# Patient Record
Sex: Male | Born: 1955 | Race: White | Hispanic: No | Marital: Married | Smoking: Never smoker
Health system: Southern US, Community
[De-identification: ages and names within clinical notes are randomized; demographics above are authoritative.]

## PROBLEM LIST (undated history)

## (undated) DIAGNOSIS — E785 Hyperlipidemia, unspecified: Secondary | ICD-10-CM

## (undated) DIAGNOSIS — R7303 Prediabetes: Secondary | ICD-10-CM

## (undated) DIAGNOSIS — R972 Elevated prostate specific antigen [PSA]: Secondary | ICD-10-CM

## (undated) DIAGNOSIS — R0789 Other chest pain: Secondary | ICD-10-CM

## (undated) DIAGNOSIS — I82409 Acute embolism and thrombosis of unspecified deep veins of unspecified lower extremity: Secondary | ICD-10-CM

## (undated) DIAGNOSIS — I2699 Other pulmonary embolism without acute cor pulmonale: Secondary | ICD-10-CM

## (undated) DIAGNOSIS — F329 Major depressive disorder, single episode, unspecified: Secondary | ICD-10-CM

## (undated) DIAGNOSIS — F32A Depression, unspecified: Secondary | ICD-10-CM

## (undated) DIAGNOSIS — C4492 Squamous cell carcinoma of skin, unspecified: Secondary | ICD-10-CM

## (undated) DIAGNOSIS — I10 Essential (primary) hypertension: Secondary | ICD-10-CM

## (undated) DIAGNOSIS — D689 Coagulation defect, unspecified: Secondary | ICD-10-CM

## (undated) DIAGNOSIS — F419 Anxiety disorder, unspecified: Secondary | ICD-10-CM

## (undated) DIAGNOSIS — N529 Male erectile dysfunction, unspecified: Secondary | ICD-10-CM

## (undated) DIAGNOSIS — G4730 Sleep apnea, unspecified: Secondary | ICD-10-CM

## (undated) HISTORY — DX: Depression, unspecified: F32.A

## (undated) HISTORY — DX: Other chest pain: R07.89

## (undated) HISTORY — DX: Essential (primary) hypertension: I10

## (undated) HISTORY — DX: Major depressive disorder, single episode, unspecified: F32.9

## (undated) HISTORY — DX: Hyperlipidemia, unspecified: E78.5

## (undated) HISTORY — DX: Male erectile dysfunction, unspecified: N52.9

## (undated) HISTORY — PX: COLONOSCOPY: SHX174

## (undated) HISTORY — PX: POLYPECTOMY: SHX149

## (undated) HISTORY — DX: Anxiety disorder, unspecified: F41.9

## (undated) HISTORY — DX: Prediabetes: R73.03

## (undated) HISTORY — DX: Sleep apnea, unspecified: G47.30

## (undated) HISTORY — DX: Coagulation defect, unspecified: D68.9

## (undated) HISTORY — DX: Elevated prostate specific antigen (PSA): R97.20

## (undated) HISTORY — DX: Squamous cell carcinoma of skin, unspecified: C44.92

## (undated) HISTORY — PX: VASECTOMY: SHX75

## (undated) HISTORY — DX: Other pulmonary embolism without acute cor pulmonale: I26.99

---

## 2002-03-25 ENCOUNTER — Encounter: Admission: RE | Admit: 2002-03-25 | Discharge: 2002-03-25 | Payer: Self-pay | Admitting: Family Medicine

## 2002-03-25 ENCOUNTER — Encounter: Payer: Self-pay | Admitting: Family Medicine

## 2004-09-18 ENCOUNTER — Ambulatory Visit: Payer: Self-pay | Admitting: Family Medicine

## 2005-06-09 ENCOUNTER — Emergency Department (HOSPITAL_COMMUNITY): Admission: EM | Admit: 2005-06-09 | Discharge: 2005-06-09 | Payer: Self-pay | Admitting: Family Medicine

## 2005-10-22 ENCOUNTER — Ambulatory Visit: Payer: Self-pay | Admitting: Family Medicine

## 2005-10-30 ENCOUNTER — Ambulatory Visit: Payer: Self-pay

## 2005-11-28 ENCOUNTER — Ambulatory Visit: Payer: Self-pay | Admitting: Family Medicine

## 2005-12-08 ENCOUNTER — Ambulatory Visit: Payer: Self-pay | Admitting: Family Medicine

## 2006-01-05 ENCOUNTER — Ambulatory Visit: Payer: Self-pay | Admitting: Family Medicine

## 2006-01-26 ENCOUNTER — Ambulatory Visit: Payer: Self-pay | Admitting: Family Medicine

## 2006-02-16 ENCOUNTER — Ambulatory Visit: Payer: Self-pay | Admitting: Family Medicine

## 2006-03-13 ENCOUNTER — Ambulatory Visit: Payer: Self-pay | Admitting: Family Medicine

## 2006-05-06 ENCOUNTER — Ambulatory Visit: Payer: Self-pay | Admitting: Family Medicine

## 2006-05-06 LAB — CONVERTED CEMR LAB
ALT: 42 units/L — ABNORMAL HIGH (ref 0–40)
AST: 34 units/L (ref 0–37)
Albumin: 3.7 g/dL (ref 3.5–5.2)
Alkaline Phosphatase: 69 units/L (ref 39–117)
Bilirubin, Direct: 0.2 mg/dL (ref 0.0–0.3)
Total Bilirubin: 1.1 mg/dL (ref 0.3–1.2)
Total Protein: 6.7 g/dL (ref 6.0–8.3)

## 2006-05-18 ENCOUNTER — Ambulatory Visit: Payer: Self-pay | Admitting: Internal Medicine

## 2006-08-10 DIAGNOSIS — E785 Hyperlipidemia, unspecified: Secondary | ICD-10-CM

## 2006-08-10 DIAGNOSIS — N400 Enlarged prostate without lower urinary tract symptoms: Secondary | ICD-10-CM

## 2006-08-10 DIAGNOSIS — C801 Malignant (primary) neoplasm, unspecified: Secondary | ICD-10-CM | POA: Insufficient documentation

## 2006-08-10 HISTORY — DX: Benign prostatic hyperplasia without lower urinary tract symptoms: N40.0

## 2006-08-10 HISTORY — DX: Hyperlipidemia, unspecified: E78.5

## 2007-05-13 HISTORY — PX: KNEE SURGERY: SHX244

## 2008-07-10 DIAGNOSIS — N529 Male erectile dysfunction, unspecified: Secondary | ICD-10-CM | POA: Insufficient documentation

## 2008-07-10 HISTORY — DX: Male erectile dysfunction, unspecified: N52.9

## 2008-07-28 ENCOUNTER — Ambulatory Visit: Payer: Self-pay | Admitting: Internal Medicine

## 2008-07-28 ENCOUNTER — Encounter (INDEPENDENT_AMBULATORY_CARE_PROVIDER_SITE_OTHER): Payer: Self-pay | Admitting: *Deleted

## 2008-07-28 DIAGNOSIS — E669 Obesity, unspecified: Secondary | ICD-10-CM

## 2008-07-28 DIAGNOSIS — R030 Elevated blood-pressure reading, without diagnosis of hypertension: Secondary | ICD-10-CM | POA: Insufficient documentation

## 2008-07-28 HISTORY — DX: Elevated blood-pressure reading, without diagnosis of hypertension: R03.0

## 2008-07-28 HISTORY — DX: Obesity, unspecified: E66.9

## 2008-08-03 ENCOUNTER — Ambulatory Visit: Payer: Self-pay | Admitting: Internal Medicine

## 2008-08-03 LAB — CONVERTED CEMR LAB
Bilirubin Urine: NEGATIVE
Glucose, Urine, Semiquant: NEGATIVE
Ketones, urine, test strip: NEGATIVE
Nitrite: NEGATIVE
Protein, U semiquant: NEGATIVE
RBC / HPF: NONE SEEN (ref <3)
Specific Gravity, Urine: <1.005
Urobilinogen, UA: 0.2
WBC Urine, dipstick: NEGATIVE
WBC, UA: NONE SEEN cells/hpf (ref <3)
pH: 5

## 2008-08-06 LAB — CONVERTED CEMR LAB
ALT: 39 units/L (ref 0–53)
AST: 30 units/L (ref 0–37)
BUN: 14 mg/dL (ref 6–23)
Basophils Absolute: 0 10*3/uL (ref 0.0–0.1)
Basophils Relative: 0.6 % (ref 0.0–3.0)
CO2: 28 meq/L (ref 19–32)
Calcium: 9.3 mg/dL (ref 8.4–10.5)
Chloride: 108 meq/L (ref 96–112)
Cholesterol: 237 mg/dL — ABNORMAL HIGH (ref 0–200)
Creatinine, Ser: 1 mg/dL (ref 0.4–1.5)
Direct LDL: 163.2 mg/dL
Eosinophils Absolute: 0.3 10*3/uL (ref 0.0–0.7)
Eosinophils Relative: 4.4 % (ref 0.0–5.0)
GFR calc non Af Amer: 83.8 mL/min (ref >60)
Glucose, Bld: 100 mg/dL — ABNORMAL HIGH (ref 70–99)
HCT: 47 % (ref 39.0–52.0)
HDL: 35.8 mg/dL — ABNORMAL LOW (ref >39.00)
Hemoglobin: 16.5 g/dL (ref 13.0–17.0)
Lymphocytes Relative: 23.6 % (ref 12.0–46.0)
Lymphs Abs: 1.7 10*3/uL (ref 0.7–4.0)
MCHC: 35.1 g/dL (ref 30.0–36.0)
MCV: 88.4 fL (ref 78.0–100.0)
Monocytes Absolute: 0.6 10*3/uL (ref 0.1–1.0)
Monocytes Relative: 8.6 % (ref 3.0–12.0)
Neutro Abs: 4.8 10*3/uL (ref 1.4–7.7)
Neutrophils Relative %: 62.8 % (ref 43.0–77.0)
PSA: 1.67 ng/mL (ref 0.10–4.00)
Platelets: 187 10*3/uL (ref 150.0–400.0)
Potassium: 4.1 meq/L (ref 3.5–5.1)
RBC: 5.31 M/uL (ref 4.22–5.81)
RDW: 12.6 % (ref 11.5–14.6)
Sodium: 142 meq/L (ref 135–145)
TSH: 0.79 microintl units/mL (ref 0.35–5.50)
Total CHOL/HDL Ratio: 7
Triglycerides: 147 mg/dL (ref 0.0–149.0)
VLDL: 29.4 mg/dL (ref 0.0–40.0)
WBC: 7.4 10*3/uL (ref 4.5–10.5)

## 2008-08-07 ENCOUNTER — Telehealth (INDEPENDENT_AMBULATORY_CARE_PROVIDER_SITE_OTHER): Payer: Self-pay | Admitting: *Deleted

## 2008-08-07 ENCOUNTER — Encounter (INDEPENDENT_AMBULATORY_CARE_PROVIDER_SITE_OTHER): Payer: Self-pay | Admitting: *Deleted

## 2008-08-10 DIAGNOSIS — D126 Benign neoplasm of colon, unspecified: Secondary | ICD-10-CM

## 2008-08-10 HISTORY — DX: Benign neoplasm of colon, unspecified: D12.6

## 2008-08-15 ENCOUNTER — Ambulatory Visit: Payer: Self-pay | Admitting: Gastroenterology

## 2008-08-30 ENCOUNTER — Ambulatory Visit: Payer: Self-pay | Admitting: Gastroenterology

## 2008-08-30 ENCOUNTER — Encounter: Payer: Self-pay | Admitting: Gastroenterology

## 2008-08-30 LAB — HM COLONOSCOPY

## 2008-08-31 ENCOUNTER — Encounter: Payer: Self-pay | Admitting: Gastroenterology

## 2008-10-13 ENCOUNTER — Ambulatory Visit: Payer: Self-pay | Admitting: Internal Medicine

## 2008-10-13 DIAGNOSIS — L259 Unspecified contact dermatitis, unspecified cause: Secondary | ICD-10-CM

## 2008-10-17 ENCOUNTER — Telehealth: Payer: Self-pay | Admitting: Internal Medicine

## 2009-10-01 ENCOUNTER — Ambulatory Visit: Payer: Self-pay | Admitting: Internal Medicine

## 2009-10-16 ENCOUNTER — Ambulatory Visit: Payer: Self-pay | Admitting: Internal Medicine

## 2009-10-16 DIAGNOSIS — F32A Depression, unspecified: Secondary | ICD-10-CM

## 2009-10-16 DIAGNOSIS — F329 Major depressive disorder, single episode, unspecified: Secondary | ICD-10-CM | POA: Insufficient documentation

## 2009-10-16 DIAGNOSIS — F419 Anxiety disorder, unspecified: Secondary | ICD-10-CM | POA: Insufficient documentation

## 2009-10-16 HISTORY — DX: Anxiety disorder, unspecified: F41.9

## 2009-10-16 HISTORY — DX: Depression, unspecified: F32.A

## 2009-10-26 ENCOUNTER — Ambulatory Visit: Payer: Self-pay | Admitting: Cardiology

## 2009-10-26 ENCOUNTER — Encounter: Payer: Self-pay | Admitting: Physician Assistant

## 2009-10-30 ENCOUNTER — Ambulatory Visit: Payer: Self-pay | Admitting: Internal Medicine

## 2009-11-06 LAB — CONVERTED CEMR LAB
ALT: 39 units/L (ref 0–53)
AST: 30 units/L (ref 0–37)
BUN: 14 mg/dL (ref 6–23)
Basophils Absolute: 0 10*3/uL (ref 0.0–0.1)
Basophils Relative: 0.5 % (ref 0.0–3.0)
CO2: 29 meq/L (ref 19–32)
Calcium: 9.4 mg/dL (ref 8.4–10.5)
Chloride: 108 meq/L (ref 96–112)
Cholesterol: 224 mg/dL — ABNORMAL HIGH (ref 0–200)
Creatinine, Ser: 1.2 mg/dL (ref 0.4–1.5)
Direct LDL: 176 mg/dL
Eosinophils Absolute: 0.2 10*3/uL (ref 0.0–0.7)
Eosinophils Relative: 3 % (ref 0.0–5.0)
GFR calc non Af Amer: 69.57 mL/min (ref >60)
Glucose, Bld: 100 mg/dL — ABNORMAL HIGH (ref 70–99)
HCT: 47.8 % (ref 39.0–52.0)
HDL: 46.4 mg/dL (ref >39.00)
Hemoglobin: 16.3 g/dL (ref 13.0–17.0)
Lymphocytes Relative: 25.2 % (ref 12.0–46.0)
Lymphs Abs: 1.9 10*3/uL (ref 0.7–4.0)
MCHC: 34.1 g/dL (ref 30.0–36.0)
MCV: 91.5 fL (ref 78.0–100.0)
Monocytes Absolute: 0.7 10*3/uL (ref 0.1–1.0)
Monocytes Relative: 9.5 % (ref 3.0–12.0)
Neutro Abs: 4.6 10*3/uL (ref 1.4–7.7)
Neutrophils Relative %: 61.8 % (ref 43.0–77.0)
PSA: 1.7 ng/mL (ref 0.10–4.00)
Platelets: 209 10*3/uL (ref 150.0–400.0)
Potassium: 4.9 meq/L (ref 3.5–5.1)
RBC: 5.23 M/uL (ref 4.22–5.81)
RDW: 13.3 % (ref 11.5–14.6)
Sodium: 144 meq/L (ref 135–145)
TSH: 1.1 microintl units/mL (ref 0.35–5.50)
Total CHOL/HDL Ratio: 5
Triglycerides: 116 mg/dL (ref 0.0–149.0)
VLDL: 23.2 mg/dL (ref 0.0–40.0)
WBC: 7.5 10*3/uL (ref 4.5–10.5)

## 2009-11-30 ENCOUNTER — Telehealth: Payer: Self-pay | Admitting: Family Medicine

## 2010-01-01 ENCOUNTER — Encounter: Payer: Self-pay | Admitting: Physician Assistant

## 2010-01-01 ENCOUNTER — Ambulatory Visit: Payer: Self-pay | Admitting: Cardiovascular Disease

## 2010-01-01 DIAGNOSIS — R079 Chest pain, unspecified: Secondary | ICD-10-CM

## 2010-01-03 ENCOUNTER — Telehealth (INDEPENDENT_AMBULATORY_CARE_PROVIDER_SITE_OTHER): Payer: Self-pay | Admitting: *Deleted

## 2010-01-07 ENCOUNTER — Encounter: Payer: Self-pay | Admitting: Cardiology

## 2010-01-07 ENCOUNTER — Ambulatory Visit: Payer: Self-pay

## 2010-01-07 ENCOUNTER — Encounter (HOSPITAL_COMMUNITY): Admission: RE | Admit: 2010-01-07 | Discharge: 2010-01-31 | Payer: Self-pay | Admitting: Cardiology

## 2010-01-07 ENCOUNTER — Ambulatory Visit: Payer: Self-pay | Admitting: Cardiology

## 2010-01-30 ENCOUNTER — Ambulatory Visit: Payer: Self-pay | Admitting: Cardiology

## 2010-02-01 ENCOUNTER — Telehealth (INDEPENDENT_AMBULATORY_CARE_PROVIDER_SITE_OTHER): Payer: Self-pay | Admitting: *Deleted

## 2010-02-28 ENCOUNTER — Telehealth: Payer: Self-pay | Admitting: Internal Medicine

## 2010-04-18 ENCOUNTER — Observation Stay (HOSPITAL_COMMUNITY): Admission: EM | Admit: 2010-04-18 | Discharge: 2009-10-26 | Payer: Self-pay | Admitting: Emergency Medicine

## 2010-06-11 NOTE — Assessment & Plan Note (Signed)
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 Vasectomy remotely  knee surgery 09-28-2007 --L--  Family History: Reviewed history from 10/01/2009 and no changes required. 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 CV:  Denies chest pain or discomfort and swelling of feet. Resp:  Denies cough, shortness of breath, and wheezing. GI:  Denies bloody stools, diarrhea, nausea, and vomiting. GU:  Denies hematuria, urinary frequency, and urinary hesitancy. Psych:  + stress , states he worries about everything, that has been going on for a while. Some difficulty sleeping at night. Takes Tylenol PM which helps.  Physical  Exam  General:  alert, well-developed, and overweight-appearing.   Neck:  no masses and no thyromegaly.   Lungs:  normal respiratory effort, no intercostal retractions, no accessory muscle use, and normal breath sounds.   Heart:  normal rate, regular rhythm, and no murmur.   Abdomen:  soft, non-tender, no distention, no masses, no guarding, and no rigidity.   Rectal:  No external abnormalities noted. Normal sphincter tone. No rectal masses or tenderness. Prostate:  Prostate gland firm and smooth, no enlargement, nodularity, tenderness, mass, asymmetry or induration. Extremities:  no edema Psych:  Oriented X3, memory intact for recent and remote, normally interactive, good eye contact, not anxious appearing, and not depressed appearing.     Impression & Recommendations:  Problem # 1:  HEALTH SCREENING (ICD-V70.0) Td 2001-09-27 colonoscopy  08/2008, tubular adenoma, next in 5 years check a PSA, he used to see Dr. Etta Grandchild patient has a FH of CAD ----> continue with aspirin, his cholesterol was slightly elevated last year, he has made some changes in his diet. Has not been able to lose weight. h/o SCC skin,again rec. to see Dr Londell Moh yearly  discussed diet and exercise     Problem # 2:  ANXIETY (ICD-300.00) he has some anxiety, states is the type of person that "worries about everything" He has some difficulty sleeping For now I recommend to take clonazepam at bedtime. Encouraged him to let me know if he  thinks he needs something different, SSRIs?  His updated medication list for this problem includes:    Clonazepam 0.5 Mg Tabs (Clonazepam) ..... One or 2 by mouth as needed for insomnia p.r.n.  Problem # 3:  ELEVATED BLOOD PRESSURE WITHOUT DIAGNOSIS OF HYPERTENSION (ICD-796.2) see  instructions  Complete Medication List: 1)  Bayer Low Strength 81 Mg Tbec (Aspirin) .Marland Kitchen.. 1  a  day 2)  Clonazepam 0.5 Mg Tabs (Clonazepam) .... One or 2 by mouth as needed for insomnia p.r.n.  Patient  Instructions: 1)  Check your blood pressure 2 or 3 times a week. If it is more than 140/85 consistently,please let us know  2)  Come back fasting: 3)  FLP, CBC, BMP, AST, ALT, TSH, PSA----Dx V70 4)  Please schedule a follow-up appointment in 6 months .  Prescriptions: CLONAZEPAM 0.5 MG TABS (CLONAZEPAM) one or 2 by mouth as needed for insomnia p.r.n.  #45 x 0   Entered and Authorized by:   Nolon Rod. Paz MD   Signed by:   Nolon Rod. Paz MD on 10/16/2009   Method used:   Print then Give to Patient   RxID:   [id number]

## 2010-06-11 NOTE — Assessment & Plan Note (Signed)
Summary: Cardiology Nuclear Testing  Nuclear Med Background Indications for Stress Test: Evaluation for Ischemia  Indications Comments: 6/11 Ed visit- CP/Epigastric Pain- Negative Enzymes  History: Echo, Myocardial Perfusion Study  History Comments: MPS- Normal  Symptoms: Chest Pain, SOB    Nuclear Pre-Procedure Cardiac Risk Factors: Family History - CAD, Hypertension, Lipids Caffeine/Decaff Intake: None NPO After: 7:30 PM Lungs: clear IV 0.9% NS with Angio Cath: 22g     IV Site: (R)  Wrist IV Started by: Irean Hong, RN Chest Size (in) 46     Height (in): 69 Weight (lb): 236 BMI: 34.98  Nuclear Med Study 1 or 2 day study:  1 day     Stress Test Type:  Stress Reading MD:  Marca Ancona, MD     Referring MD:  [place] Resting Radionuclide:  Technetium 74m Tetrofosmin     Resting Radionuclide Dose:  11 mCi  Stress Radionuclide:  Technetium 77m Tetrofosmin     Stress Radionuclide Dose:  33 mCi   Stress Protocol Exercise Time (min):  9:00 min     Max HR:  151 bpm     Predicted Max HR:  168 bpm  Max Systolic BP: 177 mm Hg     Percent Max HR:  89.88 %     METS: 10.10 Rate Pressure Product:  46962    Stress Test Technologist:  Cathlyn Parsons, RN     Nuclear Technologist:  Domenic Polite, CNMT  Rest Procedure  Myocardial perfusion imaging was performed at rest 45 minutes following the intravenous administration of Technetium 11m Tetrofosmin.  Stress Procedure  The patient exercised for .  The patient stopped due to fatigue,SOB  and denied any chest pain.  There were no significant ST-T wave changes. There was no ectopy noted. Technetium 69m Tetrofosmin was injected at peak exercise and myocardial perfusion imaging was performed after a brief delay.  QPS Raw Data Images:  Patient motion noted; appropriate software correction applied. Stress Images:  Normal homogeneous uptake in all areas of the myocardium. Rest Images:  Normal homogeneous uptake in all areas of  the myocardium. Subtraction (SDS):  No evidence of ischemia. Transient Ischemic Dilatation:  0.91  (Normal <1.22)  Lung/Heart Ratio:  0.38  (Normal <0.45)  Quantitative Gated Spect Images QGS EDV:  92 ml QGS ESV:  36 ml QGS EF:  61 % QGS cine images:  Normal  Findings Normal nuclear study      Overall Impression  Exercise Capacity: Fair exercise capacity. BP Response: Normal blood pressure response. Clinical Symptoms: SOB ECG Impression: No significant ST segment change suggestive of ischemia. Overall Impression: Normal stress nuclear study.  Appended Document: Cardiology Nuclear Testing normal study  Appended Document: Cardiology Nuclear Testing discussed results with pt by telephone

## 2010-06-11 NOTE — Assessment & Plan Note (Signed)
Summary: [name]    Visit Type:  Initial Consult  CC:  chest pain.  History of Present Illness: is a 55 year old white male patient who was seen in the emergency room with sudden onset chest pain. About 10:30 at night on October 25, 2009 after eating a [employer] she developed a burning in his epigastric region and became short of breath. EKG showed normal sinus rhythm with early precordial transition otherwise normal. Cardiac enzymes were normal.  The patient has been under a tremendous amount of stress recently. His mother died suddenly of an MI on Mother's Day at age 104. He also has a brother that had an MI at 92 and father that had a stent. His wife has been newly diagnosed with epilepsy and his daughter is struggling. He feels like stresses contributing a lot to his symptoms. He has gained over 30 pounds in the past several years and does not eat well.  The patient denies exertional symptoms. He does not exercise regularly. While sitting in the office talking about the stress in his life today he did develop the same tightening in his epigastric region. We did an EKG during this episode and it was normal. He states the pain only happens when he is under a lot of stress. He denies radiation of the pain palpitations diaphoresis dizziness or presyncope.  Current Medications (verified): 1)  Bayer Low Strength 81 Mg Tbec (Aspirin) .Marland Kitchen.. 1  A  Day 2)  Clonazepam 0.5 Mg Tabs (Clonazepam) .... One or 2 By Mouth As Needed For Insomnia P.r.n. 3)  Simvastatin 40 Mg Tabs (Simvastatin) .Marland Kitchen.. 1 By Mouth At Bedtime.  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 colon ca--no prostate ca--no CAD- F stent 72, GF GM , B MI at age 54, 33 (MI age 37) HTN--F - B lung Ca--F glaucoma-- M osteoporosis--M MGM - MI (80's) CVA--  GM stomach Ca-- PGF gout - B sister - migraine M - deceased 10-07-2009, MI age 58  Social History: Reviewed history from 11/28/2009 and no changes required. 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 Neck: No JVD, HJR, Bruit, or thyroid enlargement Lungs: No tachypnea, clear without wheezing, rales, or rhonchi Cardiovascular: RRR, PMI not displaced, heart sounds normal, no murmurs, gallops, bruit, thrill, or heave. Abdomen: BS normal. Soft without organomegaly, masses, lesions or tenderness. Extremities: without cyanosis, clubbing or edema. Good distal pulses bilateral SKin: Warm, no lesions or rashes  Musculoskeletal: No deformities Neuro: no focal signs    EKG  Procedure date:  01/01/2010  Findings:      normal sinus rhythm normal EKG  Impression & Recommendations:  Problem # 1:  CHEST PAIN (ICD-786.50) Patient has had several episodes of chest pain described as epigastric burning associated with shortness of breath. This is usually associated with stress and anxiety. The patient does have significant cardiac risk factors, including strong family history, hypercholesterolemia, and borderline hypertension. We will order a stress Myoview to rule out ischemia. His updated  medication list for this problem includes:    Bayer Low Strength 81 Mg Tbec (Aspirin) .Marland Kitchen... 1  a  day  Orders: EKG w/ Interpretation (93000) Nuclear Stress Test (Nuc Stress Test)  Problem # 2:  ANXIETY (ICD-300.00) Anxiety may be contributing to his chest pain. I asked him to discuss this with Dr. Drue Novel.  Problem # 3:  HYPERLIPIDEMIA (ICD-272.4) Patient has been on simvastatin for 2 months. Followed by Dr. Drue Novel His updated medication list for this problem includes:    Simvastatin 40 Mg Tabs (Simvastatin) .Marland Kitchen... 1 by mouth at bedtime.  Problem # 4:  ELEVATED BLOOD PRESSURE WITHOUT DIAGNOSIS OF HYPERTENSION (ICD-796.2) Blood pressure is stable today.  Problem # 5:  OBESITY (ICD-278.00) Patient has gained over 30 pounds over the past couple years. He does eat although a lot of fast food. I asked him to change his diet and begin an exercise program if his stress Myoview is normal.  Patient Instructions: 1)  Your physician recommends that you schedule a follow-up appointment in:  Dr. Shirlee Latch in 1 month 2)  Your physician recommends that you continue on your current medications as directed. Please refer to the Current Medication list given to you today. 3)  Your physician has requested that you have an exercise stress myoview.  For further information please visit [email or web address]  Please follow instruction sheet, as given.

## 2010-06-11 NOTE — Progress Notes (Signed)
Summary: CLONAZEPAM REFILL  Phone Note Refill Request Message from:  Fax from Pharmacy on February 28, 2010 3:41 PM  Refills Requested: Medication #1:  KLONOPIN 0.5 MG TABS 1 or 2 by mouth as needed for insomnia..   Last Refilled: 02/04/2010 [facility] [place], FAX [phone]     Initial call taken by: Jerolyn Shin,  February 28, 2010 3:42 PM  Follow-up for Phone Call        ok 45 and 3 RF Follow-up by: [facility] E. Paz MD,  February 28, 2010 4:20 PM    Prescriptions: KLONOPIN 0.5 MG TABS (CLONAZEPAM) 1 or 2 by mouth as needed for insomnia.  #45 x 3   Entered by:   Army Fossa CMA   Authorized by:   Nolon Rod. Paz MD   Signed by:   Army Fossa CMA on 02/28/2010   Method used:   Printed then faxed [facility] ...       [facility]  [place] [address] (retail)       [address]       [place], [place]  [address]       Ph: [phone] or [phone]       Fax: [phone]   RxID:   [id number]

## 2010-06-11 NOTE — Assessment & Plan Note (Signed)
Summary: poison ivy/cbs   Vital Signs:  Patient profile:   55 year old male Height:      69 inches Weight:      238 pounds BMI:     35.27 Pulse rate:   60 / minute BP sitting:   140 / 100  Vitals Entered By: Shary Decamp (Oct 01, 2009 10:59 AM) CC: poison ivy all over   History of Present Illness: worked in the yard 2 days ago yesterday developed a rash and itching  Current Medications (verified): 1)  Bayer Low Strength 81 Mg Tbec (Aspirin) .Marland Kitchen.. 1  A  Day 2)  Simvastatin 40 Mg Tabs (Simvastatin) .... Per Pt Not Taking 10/01/09  Allergies (verified): No Known Drug Allergies  Past History:  Past Medical History: Reviewed history from 07/28/2008 and no changes required. 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   Skin:  several erythematous lesion of different sizes and shape (legs-arms that were uncovered by cloths) some in the face    Impression & Recommendations:  Problem # 1:  CONTACT DERMATITIS-NOS (ICD-692.9) plan: depomedrol wash all cloths see instructions  The following medications were removed from the medication list:  Prednisone 20 Mg Tabs (Prednisone) .Marland Kitchen... 1 by mouth once daily    Hydrocortisone 2.5 % Crea (Hydrocortisone) .Marland Kitchen... Apply two times a day x 7 days    Prednisone 10 Mg Tabs (Prednisone) .Marland KitchenMarland KitchenMarland KitchenMarland Kitchen 6 tabs by mouth x 1 day, 5 x 1 , 4x1, 3x1, 2x1, 1x2 His updated medication list for this problem includes:    Hydrocortisone 2.5 % Crea (Hydrocortisone) .Marland Kitchen... Apply two times a day x 1 week  Orders: Depo- Medrol 80mg  (J1040) Admin of Therapeutic Inj  intramuscular or subcutaneous (81191)  Problem # 2:  due for CPX, pt aware  Complete Medication List: 1)  Bayer Low Strength 81 Mg Tbec (Aspirin) .Marland Kitchen.. 1  a  day 2)  Hydrocortisone 2.5 % Crea (Hydrocortisone) .... Apply two times a day x 1 week  Patient Instructions: 1)  cream as prescribed 2)  claritin 10mg  OTC daily  Prescriptions: HYDROCORTISONE 2.5 % CREA (HYDROCORTISONE) apply two times a day x 1 week  #1 x 0   Entered and Authorized by:   Nolon Rod. Paz MD   Signed by:   Nolon Rod. Paz MD on 10/01/2009   Method used:   [facility]        [facility]  [place] [address] [facility]       [address]       [place], [place]  [address]  Ph: [phone] or [phone]       Fax: [phone]   RxID:   [id number]    Medication Administration  Injection # 1:    Medication: Depo- Medrol 80mg     Diagnosis: CONTACT DERMATITIS-NOS (ICD-692.9)    Route: IM    Site: R deltoid    Exp Date: 03/2010    Lot #: [id number]    Patient tolerated injection without complications    Given by: Shary Decamp (Oct 01, 2009 11:49 AM)  Orders Added: 1)  Depo- Medrol 80mg  [J1040] 2)  Admin of Therapeutic Inj  intramuscular or subcutaneous [96372] 3)  Est. Patient Level III [72536]

## 2010-06-11 NOTE — Progress Notes (Signed)
Summary: Nuc Pre-Procedure  Phone Note Outgoing Call Call back at [facility] Phone [phone]   Call placed by: Antionette Char RN,  January 03, 2010 11:32 AM Call placed to: Patient Reason for Call: Confirm/change Appt Summary of Call: Reviewed information on Myoview Information Sheet (see scanned document for further details).  Spoke with Wife.     Nuclear Med Background Indications for Stress Test: Evaluation for Ischemia  Indications Comments: 6/11 Ed visit- CP/Epigastric Pain- Negative Enzymes  History: Echo, Myocardial Perfusion Study  History Comments: MPS- Normal  Symptoms: Chest Pain, SOB    Nuclear Pre-Procedure Cardiac Risk Factors: Family History - CAD, Hypertension, Lipids Height (in): 69

## 2010-06-11 NOTE — Assessment & Plan Note (Signed)
Summary: per check out/sf      Allergies Added: NKDA  Visit Type:  Follow-up Primary Provider:  Nolon Rod. Paz MD   History of Present Illness: 55 yo with history of hyperlipidemia and family history of premature CAD presents for followup.  He was seen in the ER recently with what sounds like epigastric pain rather than actual chest pain.  He has had no more symptoms since leaving the hospital.  He is active with no exertional dyspnea or chest pain.  His LDL was noted to be quite high in 6/11 and he has started simvastatin.  He had an ETT-myoview done in 8/11 that showed normal systolic function and no perfusion defects.   Labs: LDL 176, HDL 46  Current Medications (verified): 1)  Bayer Low Strength 81 Mg Tbec (Aspirin) .Marland Kitchen.. 1  A  Day 2)  Simvastatin 40 Mg Tabs (Simvastatin) .Marland Kitchen.. 1 By Mouth At Bedtime.  Allergies (verified): No Known Drug Allergies  Past History:  Past Medical History: 1. Hyperlipidemia 2. increased PSA, saw Dr Etta Grandchild,  s/p Bx (-) 2000-10-21 3. 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 ED used some cialis, symptoms not active at present (07-2008) 5. h/o borderline HTN, was on ACEi-diuretics  6. Depression, Anxiety 7. Atypical chest pain with ER visit in 8/11.  ETT-myoview showed EF 61%, no ischemia or infarction (8/11).   Family History: Reviewed history from 01/01/2010 and no changes required. colon ca--no prostate ca--no CAD- F stent 72, GF GM , B MI at age 27, 71 (MI age 10) HTN--F - B lung Ca--F glaucoma-- M osteoporosis--M MGM - MI (80's) CVA--  GM stomach Ca-- PGF gout - B sister - migraine M - deceased 10/21/09, MI age 18  Social History: Reviewed history from 11/28/2009 and no changes required. 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 Mildly obese.  Neck:  Neck supple, no JVD. No masses, thyromegaly or abnormal cervical nodes. Lungs:  Clear bilaterally to auscultation and percussion. Heart:  Non-displaced PMI, chest non-tender; regular rate and rhythm, S1, S2 without murmurs, rubs or gallops. Carotid upstroke normal, no bruit. Pedals normal pulses. No edema, no varicosities. Abdomen:  Bowel sounds positive; abdomen soft and non-tender without masses, organomegaly, or hernias noted. No hepatosplenomegaly. Extremities:  No clubbing or cyanosis. Neurologic:  Alert and oriented x 3. Psych:  Normal affect.   Impression & Recommendations:  Problem # 1:  CHEST PAIN (ICD-786.50) Pain was more epigastric.  ? gastritis or GERD.  He has not had any recent symptoms.  No exertional SOB or CP.  Normal myoview.  Suspect pain was noncardiac.   Problem # 2:  HYPERLIPIDEMIA (ICD-272.4) Given strong family history of premature CAD, would like to see LDL < 100 if possible.  He was recently started on simvastatin.  I talked to him today about exercising to try  to lose weight.   Problem # 3:  ELEVATED BLOOD PRESSURE WITHOUT DIAGNOSIS OF HYPERTENSION (ICD-796.2) Needs careful monitoring of BP.  Diastolic is mildly elevated today.

## 2010-06-11 NOTE — Progress Notes (Signed)
Summary: CLONAZEPAM REFILL  Phone Note Refill Request Message from:  Fax from Pharmacy on February 01, 2010 4:41 PM  Refills Requested: Medication #1:  CLONAZEPAM [facility].5 MG [facility] [address] [place], [place]     FAX [phone]    ON DISCONTINUED LIST  Initial call taken by: Jerolyn Shin,  February 01, 2010 4:42 PM  Follow-up for Phone Call        last filled- 11/30/09 #30 no refills last Ov- 10/16/09- CPX Spoke with pt and he states that the med should not have been removed from his med list that he had mentioned to a nurse that he was out of meds and needed a refill. Follow-up by: Army Fossa CMA,  February 04, 2010 8:52 AM  Additional Follow-up for Phone Call Additional follow up Details #1::        refill x1 Additional Follow-up by: Loreen Freud DO,  February 04, 2010 10:40 AM    New/Updated Medications: KLONOPIN 0.5 MG TABS (CLONAZEPAM) 1 or 2 by mouth as needed for insomnia. Prescriptions: KLONOPIN 0.5 MG TABS (CLONAZEPAM) 1 or 2 by mouth as needed for insomnia.  #30 x 0   Entered by:   Army Fossa CMA   Authorized by:   Loreen Freud DO   Signed by:   Army Fossa CMA on 02/04/2010   Method used:   Printed then faxed to ...       [facility]  [place] [address] (retail)       [address]       [place], [place]  [address]       Ph: [phone] or [phone]       Fax: [phone]   RxID:   [id number]

## 2010-06-11 NOTE — Progress Notes (Signed)
Summary: Clonazepam refill  Phone Note Refill Request Message from:  Fax from Pharmacy on November 30, 2009 1:21 PM  Refills Requested: Medication #1:  CLONAZEPAM 0.5 MG TABS one or 2 by mouth as needed for insomnia [facility]. [facility] [phone]  Initial call taken by: Okey Regal [place],  November 30, 2009 1:22 PM  Follow-up for Phone Call        Please advise. Lucious Groves CMA  November 30, 2009 2:37 PM   Additional Follow-up for Phone Call Additional follow up Details #1::        ok for #30, no refills Additional Follow-up by: Neena Rhymes MD,  November 30, 2009 2:43 PM    Prescriptions: CLONAZEPAM 0.5 MG TABS (CLONAZEPAM) one or 2 by mouth as needed for insomnia p.r.n.  #30 x 0   Entered by:   Lucious Groves CMA   Authorized by:   Neena Rhymes MD   Signed by:   Lucious Groves CMA on 11/30/2009   Method used:   Telephoned to ...       [facility]  [place] [address] (retail)       [address]       [place], [place]  [address]       Ph: [phone] or [phone]       Fax: [phone]   RxID:   [id number]

## 2010-07-28 LAB — BASIC METABOLIC PANEL
CO2: 24 mEq/L (ref 19–32)
Calcium: 8.9 mg/dL (ref 8.4–10.5)
Glucose, Bld: 102 mg/dL — ABNORMAL HIGH (ref 70–99)
Potassium: 3.6 mEq/L (ref 3.5–5.1)
Sodium: 140 mEq/L (ref 135–145)

## 2010-07-28 LAB — DIFFERENTIAL
Basophils Relative: 1 % (ref 0–1)
Eosinophils Absolute: 0.3 10*3/uL (ref 0.0–0.7)
Eosinophils Relative: 3 % (ref 0–5)
Lymphs Abs: 2.7 10*3/uL (ref 0.7–4.0)
Monocytes Relative: 10 % (ref 3–12)
Neutrophils Relative %: 61 % (ref 43–77)

## 2010-07-28 LAB — POCT I-STAT, CHEM 8
BUN: 12 mg/dL (ref 6–23)
Calcium, Ion: 1.17 mmol/L (ref 1.12–1.32)
Chloride: 108 mEq/L (ref 96–112)
HCT: 45 % (ref 39.0–52.0)
Sodium: 143 mEq/L (ref 135–145)

## 2010-07-28 LAB — POCT CARDIAC MARKERS
CKMB, poc: 1.2 ng/mL (ref 1.0–8.0)
Myoglobin, poc: 98.4 ng/mL (ref 12–200)
Troponin i, poc: <0.05 ng/mL (ref 0.00–0.09)
Troponin i, poc: <0.05 ng/mL (ref 0.00–0.09)

## 2010-07-28 LAB — CBC
HCT: 46.2 % (ref 39.0–52.0)
Hemoglobin: 15.8 g/dL (ref 13.0–17.0)
MCHC: 34.1 g/dL (ref 30.0–36.0)
RDW: 13.2 % (ref 11.5–15.5)

## 2010-07-28 LAB — HEPATIC FUNCTION PANEL
Albumin: 3.6 g/dL (ref 3.5–5.2)
Alkaline Phosphatase: 88 U/L (ref 39–117)
Bilirubin, Direct: 0.1 mg/dL (ref 0.0–0.3)
Indirect Bilirubin: 0.4 mg/dL (ref 0.3–0.9)
Total Bilirubin: 0.5 mg/dL (ref 0.3–1.2)

## 2010-07-28 LAB — D-DIMER, QUANTITATIVE: D-Dimer, Quant: 0.29 ug/mL-FEU (ref 0.00–0.48)

## 2010-07-28 LAB — CK TOTAL AND CKMB (NOT AT ARMC)
CK, MB: 1.7 ng/mL (ref 0.3–4.0)
CK, MB: 1.7 ng/mL (ref 0.3–4.0)
Relative Index: 0.9 (ref 0.0–2.5)
Relative Index: 1 (ref 0.0–2.5)
Total CK: 185 U/L (ref 7–232)
Total CK: 211 U/L (ref 7–232)

## 2010-07-28 LAB — LIPASE, BLOOD: Lipase: 32 U/L (ref 11–59)

## 2010-09-05 ENCOUNTER — Encounter: Payer: Self-pay | Admitting: *Deleted

## 2010-09-05 ENCOUNTER — Other Ambulatory Visit: Payer: Self-pay | Admitting: *Deleted

## 2010-09-05 MED ORDER — SIMVASTATIN 40 MG PO TABS: 40.0000 mg | ORAL_TABLET | Freq: Every day | ORAL | Status: DC

## 2010-09-05 NOTE — Telephone Encounter (Signed)
Ok 30, no RF, needs fasting OV, no further RF w/o OV

## 2010-09-27 NOTE — Assessment & Plan Note (Signed)
[facility]                             PULMONARY OFFICE NOTE   NAME:HIPPLucia, Mccreadie                       MRN:          [id number]  DATE:05/15/2006                            DOB:          20-Oct-1955    REFERRING PHYSICIAN:  Louanna Raw   PULMONARY CONSULTATION:   CHIEF COMPLAINT:  Cough.   HISTORY:  This is a 55 year old white male, who said he came down with  the flu about the same time all of his other family members did, in  October.  However, he had a persistent cough and all of their symptoms  resolved within two weeks.  Since then, he has had a progressive pattern  of coughing paroxysms that are violent at times, typically dry, and made  worse by talking or eating.  He also has trouble immediately on lying  down at night, to the point where his wife is threatening to make him  sleep in another room.  However, he denies any overt sinus or reflux  symptoms, fevers, chills, sweats, chest pain or significant dyspnea.   PAST MEDICAL HISTORY:  Significant for hypertension for which he was  started on ACE inhibitors prior to the onset of his illness this fall.  He also has history of hyperlipidemia.   ALLERGIES:  None known.   MEDICATIONS INCLUDE:  ACE inhibitors, Lexapro, hydrochlorothiazide,  Claritin and multivitamins, as well as albuterol, which he says doesn't  help much.   SOCIAL HISTORY:  He never smoked.  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   FAMILY HISTORY:  Recorded in detail on the work sheet.  Significant for  the fact his brother has allergies and father had lung cancer and was a  smoker.   REVIEW OF SYSTEMS:  Taken in detail on the work sheet and significant  for the problems outlined above.   PHYSICAL EXAMINATION:  This is a pleasant, ambulatory, white male, who  indeed does have a very harsh, barking quality upper airway cough, while  trying to give a history.  He is afebrile, normal vital signs.  HEENT:  Reveals normal turbinates.  Oropharynx is clear with no evidence  of excessive postnasal drainage or cobble-stoning.  NECK:  Supple, without cervical adenopathy or tenderness.  Trachea is  midline.  LUNG FIELDS:  Perfectly clear to auscultation and percussion with no  cough elicited on inspiratory or expiratory maneuvers.  HEART:  There is regular rhythm without murmur, gallop or rub.  ABDOMEN:  Soft, benign.  EXTREMITIES:  Warm without calf tenderness, cyanosis, clubbing or edema.   Hemoglobin saturation 96% on room air.   Chest x-ray was reviewed from April 14, 2006, and is normal.   IMPRESSION:  Classic upper airway cough syndrome, probably started as a  URI that was viral in nature, based on the fact that everyone in the  family had it.  The problem is that the patient has the typical body  habitus for reflux and is on ACE inhibitors, both of which can promote  cyclical coughing.   I recommended that he stop lisinopril,  substitute Benicar 20/25 one half  daily, take Protonix just for the next 30 days and then, after 30 days,  if better, he should return to Dr. Laury Axon for consideration for  alternative therapy to ACE inhibitors.  If, on the other hand, his cough  relapses off PPIs, this would indicate that the main underlying problem  is reflux and reflux is not simply a secondary event.   I explained the cyclical cough to him in detail, using graph and text  formatted material, and also a GERD diet.  I gave him a six-day course  of prednisone and six-week samples for Benicar.   ADDENDUM:  The patient left without his Benicar samples and was  requested to return for this purpose by phone message.     Charlaine Dalton. Sherene Sires, MD, [facility]  Electronically Signed    MBW/MedQ  DD: 05/15/2006  DT: 05/15/2006  Job #: [id number]   cc:   Lelon Perla, DO  [facility]  Louanna Raw

## 2010-10-15 ENCOUNTER — Encounter: Payer: Self-pay | Admitting: Internal Medicine

## 2010-10-15 ENCOUNTER — Ambulatory Visit (INDEPENDENT_AMBULATORY_CARE_PROVIDER_SITE_OTHER): Payer: BC Managed Care – PPO | Admitting: Internal Medicine

## 2010-10-15 DIAGNOSIS — L259 Unspecified contact dermatitis, unspecified cause: Secondary | ICD-10-CM

## 2010-10-15 DIAGNOSIS — E785 Hyperlipidemia, unspecified: Secondary | ICD-10-CM

## 2010-10-15 MED ORDER — HYDROCORTISONE 2.5 % EX CREA: TOPICAL_CREAM | Freq: Two times a day (BID) | CUTANEOUS | Status: DC

## 2010-10-15 MED ORDER — SIMVASTATIN 40 MG PO TABS: 40.0000 mg | ORAL_TABLET | Freq: Every day | ORAL | Status: DC

## 2010-10-15 MED ORDER — DOXYCYCLINE HYCLATE 100 MG PO TABS: 100.0000 mg | ORAL_TABLET | Freq: Two times a day (BID) | ORAL | Status: AC

## 2010-10-15 MED ORDER — PREDNISONE 10 MG PO TABS: ORAL_TABLET | ORAL | Status: AC

## 2010-10-15 NOTE — Patient Instructions (Signed)
Take meds as directed No excessive sun exposure while on doxycycline Call if you don't  get better or if worse Schedule a fasting physical exam within 1 month

## 2010-10-15 NOTE — Assessment & Plan Note (Signed)
He has contact dermatitis, he apparently is also developing cellulitis at the left pretibial area. We will prescribe hydrocortisone, prednisone and doxycycline. See instructions. He also has some burn, recommend sunscreen and avoidance of excessive sun exposure

## 2010-10-15 NOTE — Assessment & Plan Note (Signed)
taking cholesterol medication for months, we RF 1 month supply today and asked him to come back fasting within a month for a physical exam

## 2010-10-15 NOTE — Progress Notes (Signed)
  Subjective:    Patient ID: OVAL CAVAZOS, male    DOB: 1956/02/23, 55 y.o.   MRN: [id number]  HPI Exposed to poison ~3 days ago, developed severe itching at the lower extremities the day after, he scratched it so hard that he had some excoriations. Has noted some swelling at the right pretibial area afterwards  Past Medical History  Diagnosis Date  . Hyperlipidemia   . Increased prostate specific antigen (PSA) velocity     saw Dr Etta Grandchild, s/p Bx(-) 2002  . SCC (squamous cell carcinoma)     Dr Londell Moh, in the back  . ED (erectile dysfunction) 07/2008    used some cialis, symptoms not active at present   . Hypertension     was on ACEi-diuretics  . Depression with anxiety   . Atypical chest pain     with ER visit in 8/11. ETT-myoview showed EF 61% no ischemia or infaraction 8/11   Past Surgical History  Procedure Date  . Vasectomy   . Knee surgery 2009    left     Review of Systems Denies any fever or chills Sunburn 3 weeks ago, mostly on his legs, getting better    Objective:   Physical Exam  Constitutional: He appears well-developed and well-nourished.  Musculoskeletal:       Legs: Skin:       Pretibial area bilaterally slightly dry and scaly due to recent sunburn. A few scratches at the distal pretibial area bilaterally and also some redness and edema, see graphic          Assessment & Plan:

## 2010-10-29 ENCOUNTER — Other Ambulatory Visit: Payer: Self-pay | Admitting: Dermatology

## 2010-10-31 ENCOUNTER — Encounter: Payer: Self-pay | Admitting: Internal Medicine

## 2010-10-31 ENCOUNTER — Ambulatory Visit (INDEPENDENT_AMBULATORY_CARE_PROVIDER_SITE_OTHER): Payer: BC Managed Care – PPO | Admitting: Internal Medicine

## 2010-10-31 DIAGNOSIS — Z0000 Encounter for general adult medical examination without abnormal findings: Secondary | ICD-10-CM

## 2010-10-31 DIAGNOSIS — R030 Elevated blood-pressure reading, without diagnosis of hypertension: Secondary | ICD-10-CM

## 2010-10-31 DIAGNOSIS — E785 Hyperlipidemia, unspecified: Secondary | ICD-10-CM

## 2010-10-31 DIAGNOSIS — F411 Generalized anxiety disorder: Secondary | ICD-10-CM

## 2010-10-31 DIAGNOSIS — R319 Hematuria, unspecified: Secondary | ICD-10-CM

## 2010-10-31 HISTORY — DX: Encounter for general adult medical examination without abnormal findings: Z00.00

## 2010-10-31 LAB — CBC WITH DIFFERENTIAL/PLATELET
Basophils Absolute: 0.1 10*3/uL (ref 0.0–0.1)
Eosinophils Absolute: 0.4 10*3/uL (ref 0.0–0.7)
HCT: 48.5 % (ref 39.0–52.0)
Hemoglobin: 16.7 g/dL (ref 13.0–17.0)
Lymphs Abs: 1.9 10*3/uL (ref 0.7–4.0)
MCHC: 34.4 g/dL (ref 30.0–36.0)
Neutro Abs: 7.8 10*3/uL — ABNORMAL HIGH (ref 1.4–7.7)
Platelets: 197 10*3/uL (ref 150.0–400.0)
RDW: 13.4 % (ref 11.5–14.6)

## 2010-10-31 LAB — BASIC METABOLIC PANEL
BUN: 16 mg/dL (ref 6–23)
CO2: 28 mEq/L (ref 19–32)
Calcium: 9.5 mg/dL (ref 8.4–10.5)
GFR: 74.42 mL/min (ref >60.00)
Glucose, Bld: 100 mg/dL — ABNORMAL HIGH (ref 70–99)
Potassium: 4.5 mEq/L (ref 3.5–5.1)
Sodium: 141 mEq/L (ref 135–145)

## 2010-10-31 LAB — POCT URINALYSIS DIPSTICK
Bilirubin, UA: NEGATIVE
Glucose, UA: NEGATIVE
Leukocytes, UA: NEGATIVE
Nitrite, UA: NEGATIVE
Urobilinogen, UA: 0.2

## 2010-10-31 LAB — TSH: TSH: 1 u[IU]/mL (ref 0.35–5.50)

## 2010-10-31 LAB — PSA: PSA: 2.47 ng/mL (ref 0.10–4.00)

## 2010-10-31 LAB — LIPID PANEL: Cholesterol: 183 mg/dL (ref 0–200)

## 2010-10-31 MED ORDER — ZOLPIDEM TARTRATE 10 MG PO TABS: 10.0000 mg | ORAL_TABLET | Freq: Every evening | ORAL | Status: DC | PRN

## 2010-10-31 NOTE — Assessment & Plan Note (Signed)
See HPI, does need help sleeping, Rx [place]

## 2010-10-31 NOTE — Progress Notes (Signed)
  Subjective:    Patient ID: Oscar Fuller, male    DOB: 24-Nov-1955, 55 y.o.   MRN: [id number]  HPI Complete physical exam  Past Medical History  Diagnosis Date  . Hyperlipidemia   . Increased prostate specific antigen (PSA) velocity     saw Dr Etta Grandchild, s/p Bx(-) 2002  . SCC (squamous cell carcinoma)     Dr Londell Moh, in the back  . ED (erectile dysfunction) 07/2008    used some cialis, symptoms not active at present   . Hypertension     was on ACEi-diuretics  . Depression with anxiety   . Atypical chest pain     with ER visit in 8/11. ETT-myoview showed EF 61% no ischemia or infaraction 8/11   Past Surgical History  Procedure Date  . Vasectomy   . Knee surgery 2009    left    Family History: colon ca--no prostate ca--no CAD- F, GF GM , B MI at age 7, 22 (MI age 1) HTN--F - B lung Ca--F glaucoma-- M osteoporosis--M CVA--  GM stomach Ca-- PGF gout - B sister - migraine  Social History: Married x 2  2nd wife Zella Ball has a h/o SZ d/o 2 children 19-21 y/o daughters lost MOM 5-11, MI  age 17, she used to live w/ him  works for [employer] Scientist, water quality) Transport planner  tobacco--never ETOH-- rarely  diet-- regular exercise-- active, camping some walking   Review of Systems  Respiratory: Negative for cough and shortness of breath.   Cardiovascular: Negative for chest pain and leg swelling.  Gastrointestinal: Negative for abdominal pain and blood in stool.   occasional nocturia, no dysuria, gross hematuria or difficulty urinating. From time to time he feels his urinary stream is weak. Some anxiety and difficulty sleeping, usually wakes up and cannot go back to sleep. Worries mostly related to work and having two teenagers at home. Does not feel she is ready for SSRIs but I do think he will benefit from a sleeping pill. He agreed.    Objective:   Physical Exam  Constitutional: He is oriented to person, place, and time. He appears well-developed and well-nourished. No distress.    Neck: No thyromegaly present.       Normal carotid pulses  Cardiovascular: Normal rate, regular rhythm and normal heart sounds.   No murmur heard. Pulmonary/Chest: Effort normal and breath sounds normal. No respiratory distress. He has no wheezes. He has no rales.  Abdominal: Soft. Bowel sounds are normal. He exhibits no distension. There is no tenderness. There is no rebound and no guarding.  Genitourinary: Rectum normal and prostate normal.  Musculoskeletal: He exhibits no edema.  Neurological: He is alert and oriented to person, place, and time.  Skin: Skin is warm and dry. He is not diaphoretic.  Psychiatric: He has a normal mood and affect. His behavior is normal. Judgment and thought content normal.          Assessment & Plan:

## 2010-10-31 NOTE — Assessment & Plan Note (Signed)
On meds, labs 

## 2010-10-31 NOTE — Assessment & Plan Note (Addendum)
DBP 90, discussed w/ pt: meds v. Observation; I actually recommend lifestyle modification, monitoring BP and reassess in 6 months. Patient agreed

## 2010-10-31 NOTE — Assessment & Plan Note (Addendum)
Td 2003 colonoscopy  08/2008, tubular adenoma, next in 5 years DRE WNL, check a PSA  patient has a FH of CAD ---->stress test neg 12-2009, diet-exercise-wt loss recommended h/o SCC skin, saw Dr Londell Moh this week

## 2010-10-31 NOTE — Patient Instructions (Addendum)
Check the  blood pressure 2 or 3 times a week, be sure it is less than 140/85. If it is consistently higher, let me know ambien 1/2 or 1 tab at night as needed

## 2010-11-05 ENCOUNTER — Telehealth: Payer: Self-pay | Admitting: *Deleted

## 2010-11-05 LAB — URINE CULTURE: Colony Count: 25000

## 2010-11-05 NOTE — Telephone Encounter (Signed)
Message left for patient to return my call.  

## 2010-11-05 NOTE — Telephone Encounter (Signed)
Message copied by Leanne Lovely on Tue Nov 05, 2010  1:35 PM ------      Message from: Wanda Plump      Created: Tue Nov 05, 2010  9:41 AM       Advise patient:      Has a urinary tract infection----> call amoxicillin 500 mg 2 tablets twice a day for 2 weeks      Cholesterol has improved on simvastatin 40 mg but LDL is not at goal, given family history LDL should be less than 100.      Change from simvastatin to lipitor 40mg  1 po qhs, call 30 and 3 Rf      Schedule a lab visit in 6 weeks:      UA UCX FLP AST ALT  DX high chol and UTI      Call if s/e or problems      Other labs wnl

## 2010-11-06 NOTE — Telephone Encounter (Signed)
Message left for patient to return my call.  

## 2010-11-07 MED ORDER — ATORVASTATIN CALCIUM 40 MG PO TABS: 40.0000 mg | ORAL_TABLET | Freq: Every day | ORAL | Status: DC

## 2010-11-07 MED ORDER — AMOXICILLIN 500 MG PO CAPS: 1000.0000 mg | ORAL_CAPSULE | Freq: Two times a day (BID) | ORAL | Status: DC

## 2010-11-07 NOTE — Telephone Encounter (Signed)
I spoke w/ pt he is aware.  

## 2010-11-29 ENCOUNTER — Ambulatory Visit (HOSPITAL_BASED_OUTPATIENT_CLINIC_OR_DEPARTMENT_OTHER)
Admission: RE | Admit: 2010-11-29 | Discharge: 2010-11-29 | Disposition: A | Discharge status: Home / Self Care | Payer: BC Managed Care – PPO | Source: Ambulatory Visit | Attending: Family Medicine | Admitting: Family Medicine

## 2010-11-29 ENCOUNTER — Encounter: Payer: Self-pay | Admitting: Family Medicine

## 2010-11-29 ENCOUNTER — Ambulatory Visit (INDEPENDENT_AMBULATORY_CARE_PROVIDER_SITE_OTHER): Payer: BC Managed Care – PPO | Admitting: Family Medicine

## 2010-11-29 VITALS — BP 124/80 | HR 87 | Temp 99.1°F | Wt 242.8 lb

## 2010-11-29 DIAGNOSIS — R059 Cough, unspecified: Secondary | ICD-10-CM | POA: Insufficient documentation

## 2010-11-29 DIAGNOSIS — J40 Bronchitis, not specified as acute or chronic: Secondary | ICD-10-CM

## 2010-11-29 DIAGNOSIS — R05 Cough: Secondary | ICD-10-CM | POA: Insufficient documentation

## 2010-11-29 MED ORDER — PREDNISONE 10 MG PO TABS: 10.0000 mg | ORAL_TABLET | Freq: Every day | ORAL | Status: AC

## 2010-11-29 MED ORDER — GUAIFENESIN-CODEINE 100-10 MG/5ML PO SYRP: 5.0000 mL | ORAL_SOLUTION | Freq: Two times a day (BID) | ORAL | Status: AC | PRN

## 2010-11-29 MED ORDER — MOXIFLOXACIN HCL 400 MG PO TABS: 400.0000 mg | ORAL_TABLET | Freq: Every day | ORAL | Status: AC

## 2010-11-29 NOTE — Patient Instructions (Signed)
Bronchitis Bronchitis is the body's way of reacting to injury and/or infection (inflammation) of the bronchi. Bronchi are the air tubes that extend from the windpipe into the lungs. If the inflammation becomes severe, it may cause shortness of breath.  CAUSES Inflammation may be caused by:  A virus.   Germs (bacteria).   Dust.   Allergens.   Pollutants and many other irritants.  The cells lining the bronchial tree are covered with tiny hairs (cilia). These constantly beat upward, away from the lungs, toward the mouth. This keeps the lungs free of pollutants. When these cells become too irritated and are unable to do their job, mucus begins to develop. This causes the characteristic cough of bronchitis. The cough clears the lungs when the cilia are unable to do their job. Without either of these protective mechanisms, the mucus would settle in the lungs. Then you would develop pneumonia. Smoking is a common cause of bronchitis and can contribute to pneumonia. Stopping this habit is the single most important thing you can do to help yourself. TREATMENT  Your caregiver may prescribe an antibiotic if the cough is caused by bacteria. Also, medicines that open up your airways make it easier to breathe. Your caregiver may also recommend or prescribe an expectorant. It will loosen the mucus to be coughed up. Only take over-the-counter or prescription medicines for pain, discomfort, or fever as directed by your caregiver.   Removing whatever causes the problem (smoking, for example) is critical to preventing the problem from getting worse.   Cough suppressants may be prescribed for relief of cough symptoms.   Inhaled medicines may be prescribed to help with symptoms now and to help prevent problems from returning.   For those with recurrent (chronic) bronchitis, there may be a need for steroid medicines.  SEEK IMMEDIATE MEDICAL CARE IF:  During treatment, you develop more pus-like mucus  (purulent sputum).   You or your child has an oral temperature above 100.4, not controlled by medicine.   Your baby is older than 3 months with a rectal temperature of 102 F (38.9 C) or higher.   Your baby is 3 months old or younger with a rectal temperature of 100.4 F (38 C) or higher.   You become progressively more ill.   You have increased difficulty breathing, wheezing, or shortness of breath.  It is necessary to seek immediate medical care if you are elderly or sick from any other disease. MAKE SURE YOU:  Understand these instructions.   Will watch your condition.   Will get help right away if you are not doing well or get worse.  Document Released: 04/28/2005 Document Re-Released: 07/23/2009 Exit[employer] Patient Information 2011 [employer]. 

## 2010-11-29 NOTE — Progress Notes (Signed)
  Subjective:     Oscar Fuller is a 55 y.o. male who presents for evaluation of symptoms of a URI. Symptoms include cough described as nonproductive, sore throat and wheezing. Onset of symptoms was 4 days ago, and has been gradually worsening since that time. Treatment to date: cough suppressants.  The following portions of the patient's history were reviewed and updated as appropriate: allergies, current medications, past family history, past medical history, past social history, past surgical history and problem list.  Review of Systems Pertinent items are noted in HPI.   Objective:    BP 124/80  Pulse 87  Temp(Src) 99.1 F (37.3 C) (Oral)  Wt 242 lb 12.8 oz (110.133 kg)  SpO2 95% General appearance: alert, cooperative, appears stated age and no distress Throat: abnormal findings: mild oropharyngeal erythema and no exudate Neck: mild anterior cervical adenopathy, supple, symmetrical, trachea midline and thyroid not enlarged, symmetric, no tenderness/mass/nodules Lungs: diminished breath sounds bilaterally and wheezes bilaterally Heart: regular rate and rhythm, S1, S2 normal, no murmur, click, rub or gallop Extremities: extremities normal, atraumatic, no cyanosis or edema   Assessment:    bronchitis --  R/o pneumonia  Plan:    Nasal saline spray for congestion. Follow up as needed. Call in 4 days if symptoms aren't resolving. avelox for 5 days,  cheratussin and prednisone taper  CXR

## 2011-01-23 ENCOUNTER — Telehealth: Payer: Self-pay | Admitting: *Deleted

## 2011-01-23 NOTE — Telephone Encounter (Signed)
Prior Auth denied coverage for additional drug quantities is provided in situations where the prescriber indicates that the patient requires nightly treatment for chronic persistent insomnia.

## 2011-01-23 NOTE — Telephone Encounter (Signed)
Notify pt of his insurance's decision

## 2011-01-24 NOTE — Telephone Encounter (Signed)
Left message to call office

## 2011-01-27 NOTE — Telephone Encounter (Signed)
Discuss with patient  

## 2011-04-28 ENCOUNTER — Other Ambulatory Visit: Payer: Self-pay | Admitting: Internal Medicine

## 2011-05-12 ENCOUNTER — Ambulatory Visit: Payer: BC Managed Care – PPO | Admitting: Internal Medicine

## 2011-10-22 ENCOUNTER — Other Ambulatory Visit: Payer: Self-pay | Admitting: Internal Medicine

## 2011-10-23 NOTE — Telephone Encounter (Signed)
Refill done.  

## 2012-02-23 ENCOUNTER — Ambulatory Visit (INDEPENDENT_AMBULATORY_CARE_PROVIDER_SITE_OTHER): Payer: BC Managed Care – PPO | Admitting: Internal Medicine

## 2012-02-23 VITALS — BP 144/86 | HR 83 | Temp 98.2°F | Wt 246.0 lb

## 2012-02-23 DIAGNOSIS — E785 Hyperlipidemia, unspecified: Secondary | ICD-10-CM

## 2012-02-23 DIAGNOSIS — R030 Elevated blood-pressure reading, without diagnosis of hypertension: Secondary | ICD-10-CM

## 2012-02-23 DIAGNOSIS — F411 Generalized anxiety disorder: Secondary | ICD-10-CM

## 2012-02-23 MED ORDER — ATORVASTATIN CALCIUM 40 MG PO TABS: 40.0000 mg | ORAL_TABLET | Freq: Every day | ORAL | Status: DC

## 2012-02-23 NOTE — Progress Notes (Signed)
  Subjective:    Patient ID: Oscar Fuller, male    DOB: 09/07/1955, 56 y.o.   MRN: [id number]  HPI Acute visit Last week noted left ear ache, a high-pitched sound and mild dizziness. Dizziness is usually when she bends over and gets back up, lasting seconds.  Family History: colon ca--no prostate ca--no CAD- F, GF GM , B MI at age 19, 12 (MI age 49) HTN--F - B lung Ca--F glaucoma-- M osteoporosis--M CVA--  GM stomach Ca-- PGF gout - B sister - migraine  Social History: Married x 2   2nd wife Oscar Fuller has a h/o SZ d/o 2 children 19-21 y/o daughters lost MOM 5-11, MI  age 30, she used to live w/ him   works for [employer] Scientist, water quality) Transport planner   tobacco--never ETOH-- rarely   diet-- regular exercise-- active, camping some walking    Review of Systems No fever or chills No URI type of symptoms. Denies ear discharge or recent swimming. No headache or diplopia. No nausea vomiting. Had a UTI last year, currently asymptomatic. Last year was prescribed Lipitor, did take it, no side effects but ran out a week ago.    Objective:   Physical Exam General -- alert, well-developed, and overweight appearing. No apparent distress.  Neck --no LADs HEENT -- TMs normal, throat w/o redness, face symmetric and not tender to palpation, nose not congested ; TMJs without week but is slightly tender on the left (pressure at the left TMJ reproduced patient's pain) Lungs -- normal respiratory effort, no intercostal retractions, no accessory muscle use, and normal breath sounds.   Heart-- normal rate, regular rhythm, no murmur, and no gallop.   Extremities-- no pretibial edema bilaterally  Neurologic-- alert & oriented X3; face symmetric, speech normal, gait normal, EOMI-PERLA; Romberg absent Psych-- Cognition and judgment appear intact. Alert and cooperative with normal attention span and concentration.  not anxious appearing and not depressed appearing.       Assessment & Plan:   Left ear  pain, likely TMJ, recommend avoid excessive chewing, Motrin, discuss w/ dentist. Dizziness, likely unrelated to TMJ, symptoms are mild, neurological and ear exam normal. Recommend observation.

## 2012-02-23 NOTE — Assessment & Plan Note (Addendum)
This in the last cholesterol panel, we changed from simvastatin to Lipitor, good compliance but run out off medicines a week ago. Plan: RF medications, labs on RTC

## 2012-02-23 NOTE — Patient Instructions (Addendum)
Please come back fasting in one month for a physical exam

## 2012-02-23 NOTE — Assessment & Plan Note (Signed)
BP today 144/86

## 2012-02-23 NOTE — Assessment & Plan Note (Addendum)
Under the care of Dr. Dub Mikes He requested a free and total testosterone, TSH, T3, T4 Will get those labs at time of CPX and fax them to [phone].

## 2012-02-24 ENCOUNTER — Encounter: Payer: Self-pay | Admitting: Internal Medicine

## 2012-03-29 ENCOUNTER — Ambulatory Visit (INDEPENDENT_AMBULATORY_CARE_PROVIDER_SITE_OTHER): Payer: BC Managed Care – PPO | Admitting: Internal Medicine

## 2012-03-29 ENCOUNTER — Encounter: Payer: Self-pay | Admitting: Internal Medicine

## 2012-03-29 VITALS — BP 128/82 | HR 75 | Temp 98.0°F | Ht 69.0 in | Wt 244.0 lb

## 2012-03-29 DIAGNOSIS — Z23 Encounter for immunization: Secondary | ICD-10-CM

## 2012-03-29 DIAGNOSIS — R0683 Snoring: Secondary | ICD-10-CM | POA: Insufficient documentation

## 2012-03-29 DIAGNOSIS — Z0000 Encounter for general adult medical examination without abnormal findings: Secondary | ICD-10-CM

## 2012-03-29 LAB — CBC WITH DIFFERENTIAL/PLATELET
Eosinophils Relative: 4.3 % (ref 0.0–5.0)
HCT: 48 % (ref 39.0–52.0)
Hemoglobin: 16.1 g/dL (ref 13.0–17.0)
Lymphocytes Relative: 21.1 % (ref 12.0–46.0)
Lymphs Abs: 1.7 10*3/uL (ref 0.7–4.0)
Monocytes Relative: 8.7 % (ref 3.0–12.0)
Neutro Abs: 5.1 10*3/uL (ref 1.4–7.7)
WBC: 7.9 10*3/uL (ref 4.5–10.5)

## 2012-03-29 LAB — COMPREHENSIVE METABOLIC PANEL
BUN: 13 mg/dL (ref 6–23)
CO2: 26 mEq/L (ref 19–32)
Creatinine, Ser: 1.2 mg/dL (ref 0.4–1.5)
GFR: 66.31 mL/min (ref >60.00)
Glucose, Bld: 106 mg/dL — ABNORMAL HIGH (ref 70–99)
Total Bilirubin: 0.8 mg/dL (ref 0.3–1.2)

## 2012-03-29 LAB — LIPID PANEL
Cholesterol: 141 mg/dL (ref 0–200)
HDL: 38.8 mg/dL — ABNORMAL LOW (ref >39.00)
Triglycerides: 127 mg/dL (ref 0.0–149.0)

## 2012-03-29 LAB — TSH: TSH: 1.1 u[IU]/mL (ref 0.35–5.50)

## 2012-03-29 NOTE — Assessment & Plan Note (Signed)
Highly suspicious of a sleep apnea, CAD increased risk related d/t  OSA discussed with the patient. Recommend sleep study

## 2012-03-29 NOTE — Progress Notes (Signed)
  Subjective:    Patient ID: Oscar Fuller, male    DOB: 1955/05/30, 56 y.o.   MRN: [id number]  HPI CPX  Past Medical History  Diagnosis Date  . Hyperlipidemia   . Increased prostate specific antigen (PSA) velocity     saw Dr Etta Grandchild, s/p Bx(-) 2002  . SCC (squamous cell carcinoma)     Dr Londell Moh, in the back  . ED (erectile dysfunction) 07/2008    used some cialis, symptoms not active at present   . Hypertension     was on ACEi-diuretics  . Depression with anxiety   . Atypical chest pain     with ER visit in 8/11. ETT-myoview showed EF 61% no ischemia or infaraction 8/11   Past Surgical History  Procedure Date  . Vasectomy   . Knee surgery 2009    left arthroscopy   History   Social History  . Marital Status: Married    Spouse Name: N/A    Number of Children: 2  . Years of Education: N/A   Occupational History  . [employer]    Social History Main Topics  . Smoking status: Never Smoker   . Smokeless tobacco: Never Used  . Alcohol Use: Yes  . Drug Use: No  . Sexually Active: Not on file   Other Topics Concern  . Not on file   Social History Narrative   Married x 2  , 2nd wife Zella Ball has a h/o SZ d/o ----Exercise: walks some ---Diet : regular    Family History  Problem Relation Age of Onset  . Colon cancer Neg Hx   . Prostate cancer Neg Hx   . Coronary artery disease Father     stent 70, GF GM  . Heart attack Brother   . Heart attack Mother   . Hypertension Father   . Hypertension Brother   . Lung cancer Father   . Glaucoma Mother   . Heart attack Maternal Grandmother   . Stroke      CVA GM  . Stomach cancer Paternal Grandfather   . Gout Brother   . Migraines Sister      Review of Systems Was seen recently with dizziness and TMJ pain, symptoms resolved. Reports moderate fatigue, on further questioning, he does snores a lot, wife has witnessed apnea episodes, he does feel sleepy throughout the day. No chest pain or shortness of breath. No dysuria or  gross hematuria No blood in the stools.     Objective:   Physical Exam General -- alert, well-developed, and overweight appearing. No apparent distress.  Neck --no thyromegaly Lungs -- normal respiratory effort, no intercostal retractions, no accessory muscle use, and normal breath sounds.   Heart-- normal rate, regular rhythm, no murmur, and no gallop.   Abdomen--soft, non-tender, no distention, no masses, no HSM, no guarding, and no rigidity.   Extremities-- no pretibial edema bilaterally  DRE-- normal external examination, prostate without nodularity or tenderness, slightly increased in size Neurologic-- alert & oriented X3 and strength normal in all extremities. Psych-- Cognition and judgment appear intact. Alert and cooperative with normal attention span and concentration.  not anxious appearing and not depressed appearing.       Assessment & Plan:

## 2012-03-29 NOTE — Assessment & Plan Note (Addendum)
Td 2013 colonoscopy  08/2008, tubular adenoma, next in 5 years DRE WNL except for a slightly increased in size prostate, history of a negative prostate biopsy due to increased PSA velocity years ago, does not see urology  anymore--->  check a PSA  patient has a FH of CAD ---->stress test neg 12-2009, diet-exercise-wt loss recommended h/o SCC skin, sees der from time to time  Diet exercise discussed

## 2012-03-31 ENCOUNTER — Encounter: Payer: Self-pay | Admitting: *Deleted

## 2012-04-20 ENCOUNTER — Encounter: Payer: BC Managed Care – PPO | Admitting: Internal Medicine

## 2012-05-21 ENCOUNTER — Ambulatory Visit (INDEPENDENT_AMBULATORY_CARE_PROVIDER_SITE_OTHER): Payer: BC Managed Care – PPO | Admitting: Pulmonary Disease

## 2012-05-21 ENCOUNTER — Telehealth: Payer: Self-pay | Admitting: Internal Medicine

## 2012-05-21 DIAGNOSIS — R0683 Snoring: Secondary | ICD-10-CM

## 2012-05-21 DIAGNOSIS — G4733 Obstructive sleep apnea (adult) (pediatric): Secondary | ICD-10-CM

## 2012-05-21 NOTE — Telephone Encounter (Signed)
Discussed with pt, entered referral.  

## 2012-05-21 NOTE — Telephone Encounter (Signed)
Oscar Fuller, pt's sleep study showed moderate to severe OSA with AHI 34/hr.  Let patient know, arrange a pulmonary referral with Dr. Shelle Iron

## 2012-06-08 ENCOUNTER — Encounter: Payer: Self-pay | Admitting: Pulmonary Disease

## 2012-06-08 ENCOUNTER — Ambulatory Visit (INDEPENDENT_AMBULATORY_CARE_PROVIDER_SITE_OTHER): Payer: BC Managed Care – PPO | Admitting: Pulmonary Disease

## 2012-06-08 VITALS — BP 140/98 | HR 71 | Temp 98.5°F | Ht 69.0 in | Wt 247.0 lb

## 2012-06-08 DIAGNOSIS — G4733 Obstructive sleep apnea (adult) (pediatric): Secondary | ICD-10-CM

## 2012-06-08 HISTORY — DX: Obstructive sleep apnea (adult) (pediatric): G47.33

## 2012-06-08 NOTE — Progress Notes (Signed)
  Subjective:    Patient ID: Oscar Fuller, male    DOB: [birth year], 57 y.o.   MRN: [id number]  HPI The patient is a 57 year old male who I've been asked to see for management of obstructive sleep apnea.  He recently had home sleep testing which showed an AHI of 34 events per hour with oxygen desaturation as low as 69%.  The patient has been noted to have loud snoring as well as an abnormal breathing pattern during sleep by his wife.  He has frequent awakenings at night, and is not rested in the mornings upon arising.  He notes definite sleep pressure during the day with quiet periods at work, and will fall sleep easily in the evenings watching television or movies.  He denies any issues with sleepiness while driving.  The patient states that his weight is up about 10 pounds over the last 2 years, and his Epworth sleepiness score today is 13.  Sleep Questionnaire: What time do you typically go to bed?( Between what hours) 11p-12am How long does it take you to fall asleep? How many times during the night do you wake up? 3 What time do you get out of bed to start your day? 0600 Do you drive or operate heavy machinery in your occupation? No How much has your weight changed (up or down) over the past two years? (In pounds) 20 lb (9.072 kg) Have you ever had a sleep study before? Yes If yes, location of study? Home Sleep Study If yes, date of study? 04/2012 and 05/2012 Do you currently use CPAP? No Do you wear oxygen at any time? No    Review of Systems  Constitutional: Negative for fever. Unexpected weight change:  increase in weight 20lb+ in past 2 year.  HENT: Negative for ear pain, nosebleeds, congestion, sore throat, rhinorrhea, sneezing, trouble swallowing, dental problem, postnasal drip and sinus pressure.   Eyes: Negative for redness and itching.  Respiratory: Negative for cough, chest tightness, shortness of breath and wheezing.   Cardiovascular: Negative for palpitations and leg swelling.    Gastrointestinal: Negative for nausea and vomiting.  Genitourinary: Negative for dysuria.  Musculoskeletal: Negative for joint swelling.  Skin: Negative for rash.  Neurological: Negative for headaches.  Hematological: Does not bruise/bleed easily.  Psychiatric/Behavioral: Negative for dysphoric mood. The patient is not nervous/anxious.        Objective:   Physical Exam Constitutional:  Overweight male, no acute distress  HENT:  Nares patent without discharge, septal deviation to left with narrowing.   Oropharynx without exudate, palate and uvula are thick and elongated.   Eyes:  Perrla, eomi, no scleral icterus  Neck:  No JVD, no TMG  Cardiovascular:  Normal rate, regular rhythm, no rubs or gallops.  No murmurs        Intact distal pulses  Pulmonary :  Normal breath sounds, no stridor or respiratory distress   No rales, rhonchi, or wheezing  Abdominal:  Soft, nondistended, bowel sounds present.  No tenderness noted.   Musculoskeletal:  No lower extremity edema noted.  Lymph Nodes:  No cervical lymphadenopathy noted  Skin:  No cyanosis noted  Neurologic:  Alert, appropriate, moves all 4 extremities without obvious deficit.         Assessment & Plan:

## 2012-06-08 NOTE — Assessment & Plan Note (Signed)
The patient has moderate to severe obstructive sleep apnea on his recent sleep test, and gives a classic history with significant sleep disruption and daytime symptoms.  I had a long discussion with him about sleep apnea, including its impact on his quality of life and cardiovascular health.  I have recommended a trial of CPAP while he is working on weight loss, and the patient is agreeable. I will set the patient up on cpap at a moderate pressure level to allow for desensitization, and will troubleshoot the device over the next 4-6weeks if needed.  The pt is to call me if having issues with tolerance.  Will then optimize the pressure once patient is able to wear cpap on a consistent basis.

## 2012-06-08 NOTE — Patient Instructions (Addendum)
Will start on cpap at a moderate pressure level.  Please call if having issues with tolerance Work on weight loss followup with me in 6weeks.  

## 2012-07-19 ENCOUNTER — Encounter (HOSPITAL_COMMUNITY): Payer: Self-pay | Admitting: *Deleted

## 2012-07-19 ENCOUNTER — Emergency Department (INDEPENDENT_AMBULATORY_CARE_PROVIDER_SITE_OTHER): Payer: BC Managed Care – PPO

## 2012-07-19 ENCOUNTER — Emergency Department (HOSPITAL_COMMUNITY)
Admission: EM | Admit: 2012-07-19 | Discharge: 2012-07-19 | Disposition: A | Discharge status: Home / Self Care | Payer: BC Managed Care – PPO | Source: Home / Self Care

## 2012-07-19 DIAGNOSIS — J040 Acute laryngitis: Secondary | ICD-10-CM

## 2012-07-19 DIAGNOSIS — J329 Chronic sinusitis, unspecified: Secondary | ICD-10-CM

## 2012-07-19 MED ORDER — AMOXICILLIN-POT CLAVULANATE 875-125 MG PO TABS: 1.0000 | ORAL_TABLET | Freq: Two times a day (BID) | ORAL | Status: DC

## 2012-07-19 NOTE — ED Notes (Addendum)
Pt  Reports  Symptoms  Of  Cough  /  Congestion           Coughing  Up blood  Tinged  Sputum  With hoarseness  And  A  sorethroat  With  The  Symptoms  Persisting  For  About 1  Week        he  At this  Time  Is  Sitting  Upright on  The  Exam table  Speaking in  Complete  sentances  And  Is  In no  Severe  Distress

## 2012-07-19 NOTE — ED Provider Notes (Signed)
History     CSN: [id number]  Arrival date & time 07/19/12  1122   First MD Initiated Contact with Patient 07/19/12 1356      Chief Complaint  Patient presents with  . Cough    (Consider location/radiation/quality/duration/timing/severity/associated sxs/prior treatment) HPI Comments: Pt with worsening URI. Concerned has pneumonia  Patient is a 57 y.o. male presenting with cough. The history is provided by the patient.  Cough Cough characteristics:  Productive Sputum characteristics:  Bloody and yellow Severity:  Severe Onset quality:  Gradual Duration:  1 week Timing:  Intermittent Progression:  Worsening Chronicity:  New Smoker: no   Context: sick contacts and upper respiratory infection   Relieved by:  Nothing Worsened by:  Lying down Ineffective treatments:  Cough suppressants and decongestant Associated symptoms: chills, sinus congestion and sore throat   Associated symptoms: no ear pain and no fever     Past Medical History  Diagnosis Date  . Hyperlipidemia   . Increased prostate specific antigen (PSA) velocity     saw Dr Etta Grandchild, s/p Bx(-) 2002  . SCC (squamous cell carcinoma)     Dr Londell Moh, in the back  . ED (erectile dysfunction) 07/2008    used some cialis, symptoms not active at present   . Hypertension     was on ACEi-diuretics  . Depression with anxiety   . Atypical chest pain     with ER visit in 8/11. ETT-myoview showed EF 61% no ischemia or infaraction 8/11    Past Surgical History  Procedure Laterality Date  . Vasectomy    . Knee surgery  2009    left arthroscopy    Family History  Problem Relation Age of Onset  . Colon cancer Neg Hx   . Prostate cancer Neg Hx   . Coronary artery disease Father     stent 70, GF GM  . Heart attack Brother   . Heart attack Mother   . Hypertension Father   . Hypertension Brother   . Lung cancer Father   . Glaucoma Mother   . Heart attack Maternal Grandmother   . Stroke      CVA GM  . Stomach cancer  Paternal Grandfather   . Gout Brother   . Migraines Sister     History  Substance Use Topics  . Smoking status: Never Smoker   . Smokeless tobacco: Never Used  . Alcohol Use: No     Comment: occassional//rare      Review of Systems  Constitutional: Positive for chills. Negative for fever.  HENT: Positive for congestion, sore throat, voice change, postnasal drip and sinus pressure. Negative for ear pain.   Respiratory: Positive for cough.        Doesn't feel SOB but feels chest is congested    Allergies  Review of patient's allergies indicates no known allergies.  Home Medications   Current Outpatient Rx  Name  Route  Sig  Dispense  Refill  . amoxicillin-clavulanate (AUGMENTIN) 875-125 MG per tablet   Oral   Take 1 tablet by mouth 2 (two) times daily.   14 tablet   0   . aspirin 81 MG tablet   Oral   Take 81 mg by mouth daily.           Marland Kitchen atorvastatin (LIPITOR) 40 MG tablet   Oral   Take 1 tablet (40 mg total) by mouth daily.   90 tablet   2   . sertraline (ZOLOFT) 50 MG tablet  Oral   Take 50 mg by mouth daily.         Marland Kitchen zolpidem (AMBIEN) 10 MG tablet   Oral   Take 10 mg by mouth at bedtime as needed.           BP 133/92  Pulse 82  Temp(Src) 98.7 F (37.1 C) (Oral)  Resp 20  SpO2 98%  Physical Exam  Constitutional: He appears well-developed and well-nourished. No distress.  HENT:  Right Ear: Tympanic membrane, external ear and ear canal normal.  Left Ear: Tympanic membrane, external ear and ear canal normal.  Nose: Mucosal edema present. Right sinus exhibits maxillary sinus tenderness. Right sinus exhibits no frontal sinus tenderness. Left sinus exhibits maxillary sinus tenderness. Left sinus exhibits no frontal sinus tenderness.  Mouth/Throat: Posterior oropharyngeal edema and posterior oropharyngeal erythema present. No oropharyngeal exudate.  Cardiovascular: Normal rate and regular rhythm.   Pulmonary/Chest: Effort normal and breath  sounds normal.  Occasional wet sounding cough  Lymphadenopathy:       Head (right side): No submental, no submandibular and no tonsillar adenopathy present.       Head (left side): No submental, no submandibular and no tonsillar adenopathy present.    He has no cervical adenopathy.    ED Course  Procedures (including critical care time)  Labs Reviewed - No data to display Dg Chest 2 View  07/19/2012  *RADIOLOGY REPORT*  Clinical Data: Cough and congestion for 1 week.  Nonsmoker.  CHEST - 2 VIEW  Comparison: 11/29/2010  Findings: Heart size is accentuated by shallow lung inflation.  No focal consolidations or pleural effusions.  No pulmonary edema. Visualized osseous structures have a normal appearance.  IMPRESSION: Negative exam.   Original Report Authenticated By: Norva Pavlov, M.D.      1. Sinusitis   2. Laryngitis       MDM          Cathlyn Parsons, NP 07/19/12 1408

## 2012-07-20 ENCOUNTER — Ambulatory Visit: Payer: BC Managed Care – PPO | Admitting: Pulmonary Disease

## 2012-07-27 ENCOUNTER — Ambulatory Visit: Payer: BC Managed Care – PPO | Admitting: Pulmonary Disease

## 2012-07-28 ENCOUNTER — Encounter: Payer: Self-pay | Admitting: Internal Medicine

## 2012-07-28 ENCOUNTER — Ambulatory Visit (INDEPENDENT_AMBULATORY_CARE_PROVIDER_SITE_OTHER): Payer: BC Managed Care – PPO | Admitting: Internal Medicine

## 2012-07-28 VITALS — BP 116/78 | HR 68 | Temp 98.0°F | Wt 245.0 lb

## 2012-07-28 DIAGNOSIS — F411 Generalized anxiety disorder: Secondary | ICD-10-CM

## 2012-07-28 DIAGNOSIS — G4733 Obstructive sleep apnea (adult) (pediatric): Secondary | ICD-10-CM

## 2012-07-28 DIAGNOSIS — J209 Acute bronchitis, unspecified: Secondary | ICD-10-CM

## 2012-07-28 MED ORDER — AZITHROMYCIN 250 MG PO TABS: ORAL_TABLET | ORAL | Status: DC

## 2012-07-28 MED ORDER — HYDROCODONE-HOMATROPINE 5-1.5 MG/5ML PO SYRP: 5.0000 mL | ORAL_SOLUTION | Freq: Every evening | ORAL | Status: DC | PRN

## 2012-07-28 MED ORDER — ALBUTEROL SULFATE HFA 108 (90 BASE) MCG/ACT IN AERS: 2.0000 | INHALATION_SPRAY | Freq: Four times a day (QID) | RESPIRATORY_TRACT | Status: DC | PRN

## 2012-07-28 NOTE — Assessment & Plan Note (Signed)
Just started to use CPAP when he got sick 2 weeks ago, anticipate he will feel better once   he uses CPAP consistently

## 2012-07-28 NOTE — Patient Instructions (Addendum)
Rest, fluids , tylenol For cough, take Mucinex DM twice a day as needed  If wheezing, ise albuterol 4 times a day as needed  For persistent cough at night, use hydrocodone, will cause drowsiness  Take the antibiotic as prescribed  (zithromax) Call if no better in few days Call anytime if the symptoms are severe

## 2012-07-28 NOTE — Progress Notes (Signed)
  Subjective:    Patient ID: Oscar Fuller, male    DOB: 12-Nov-1955, 57 y.o.   MRN: [id number]  HPI Acute visit --2 and half weeks h/o persistent cough productive of greenish sputum. Went to the urgent care 07/20/2011, chest x-ray was negative, was prescribed augmentin. The cough is unchanged. --Also, used to see a psychiatrist for a prescription of Zoloft and Ambien, that practice changed and he won't be able to see his psych again. Symptoms are well-controlled. See assessment and plan. --Diagnosed with a sleep apnea, just started to use a CPAP when he got sick.  Past Medical History  Diagnosis Date  . Hyperlipidemia   . Increased prostate specific antigen (PSA) velocity     saw Dr Etta Grandchild, s/p Bx(-) 2002  . SCC (squamous cell carcinoma)     Dr Londell Moh, in the back  . ED (erectile dysfunction) 07/2008    used some cialis, symptoms not active at present   . Hypertension     was on ACEi-diuretics  . Depression with anxiety   . Atypical chest pain     with ER visit in 8/11. ETT-myoview showed EF 61% no ischemia or infaraction 8/11   Past Surgical History  Procedure Laterality Date  . Vasectomy    . Knee surgery  2009    left arthroscopy   Review of Systems No fever or chills No GERD symptoms, vomiting or diarrhea. He Nausea without Vomiting. Admits to Occasional Wheezing. No Myalgias. Unable to Sleep Well Due To Persisting Cough. Minimal if any postnasal dripping.    Objective:   Physical Exam BP 116/78  Pulse 68  Temp(Src) 98 F (36.7 C) (Oral)  Wt 245 lb (111.131 kg)  BMI 36.16 kg/m2  SpO2 96%  General -- alert, well-developed, NAD, cough noted .    HEENT -- TMs normal, throat w/o redness, face symmetric and not tender to palpation, nose clear Lungs -- normal respiratory effort, no intercostal retractions, no accessory muscle use, and prolonged expiratory time with cough. Heart-- normal rate, regular rhythm, no murmur, and no gallop.    Neurologic-- alert & oriented X3  and strength normal in all extremities. Psych-- Cognition and judgment appear intact. Alert and cooperative with normal attention span and concentration.  not anxious appearing and not depressed appearing.      Assessment & Plan:   Bronchitis, Symptoms consistent with bronchitis, atypical infection?. He doesn't have a history of asthma but reports wheezing, at some point he use inhalers when he had pneumonia. Plan: Z-Pak, albuterol, hydrocodone, see instructions.

## 2012-07-28 NOTE — Assessment & Plan Note (Signed)
Used to see Dr. Dub Mikes for they prescription of zoloft and Ambien, Dr. Dub Mikes practice changed, symptoms are well-controlled.  We talk about referral to another psychiatrist or prescribed medication here, he elected to continue his care here.  Will call for prescription as needed.

## 2012-08-11 ENCOUNTER — Ambulatory Visit (INDEPENDENT_AMBULATORY_CARE_PROVIDER_SITE_OTHER): Payer: BC Managed Care – PPO | Admitting: Pulmonary Disease

## 2012-08-11 ENCOUNTER — Encounter: Payer: Self-pay | Admitting: Pulmonary Disease

## 2012-08-11 VITALS — BP 134/82 | HR 89 | Temp 98.4°F | Ht 69.0 in | Wt 248.2 lb

## 2012-08-11 DIAGNOSIS — G4733 Obstructive sleep apnea (adult) (pediatric): Secondary | ICD-10-CM

## 2012-08-11 NOTE — Patient Instructions (Addendum)
Will have your pressure optimized on the automatic setting for the next few weeks.  Will call you with results once I receive your download. Work on weight loss followup with me in 6mos, but call if having tolerance issues.

## 2012-08-11 NOTE — Progress Notes (Signed)
  Subjective:    Patient ID: Oscar Fuller, male    DOB: 1955-07-09, 57 y.o.   MRN: [id number]  HPI The patient comes in today for followup of his obstructive sleep apnea.  He was started on CPAP at the last visit, and as he had issues over the last few weeks because of nasal congestion/head cold.  This is improving, and he is looking forward to wearing the CPAP more compliantly.  He tells me that when he was wearing the CPAP, his bed partner did not hear snoring or sleep breakthrough apneas.  He thinks that he slept better, but is unsure because it was such a short period of time.  He is currently using a fullface mask, and is having no significant issues with tolerance.   Review of Systems  Constitutional: Negative for fever and unexpected weight change.  HENT: Negative for ear pain, nosebleeds, congestion, sore throat, rhinorrhea, sneezing, trouble swallowing, dental problem, postnasal drip and sinus pressure.   Eyes: Negative for redness and itching.  Respiratory: Positive for cough. Negative for chest tightness, shortness of breath and wheezing.   Cardiovascular: Negative for palpitations and leg swelling.  Gastrointestinal: Negative for nausea and vomiting.  Genitourinary: Negative for dysuria.  Musculoskeletal: Negative for joint swelling.  Skin: Negative for rash.  Neurological: Negative for headaches.  Hematological: Does not bruise/bleed easily.  Psychiatric/Behavioral: Negative for dysphoric mood. The patient is not nervous/anxious.        Objective:   Physical Exam Overweight male in no acute distress Nose without purulence or discharge noted No skin breakdown or pressure necrosis from the CPAP mask Neck without lymphadenopathy or thyromegaly Lower extremities without edema, no cyanosis Alert, does not appear to be sleepy, moves all 4 extremities.       Assessment & Plan:

## 2012-08-11 NOTE — Assessment & Plan Note (Addendum)
The patient has been trying to be compliant with CPAP, and he has been successful until recently when he developed a head cold.  He does not feel there are any major stumbling blocks here, and that it is just a matter of adapting to the device.  I have told him that we need to optimize his pressure, and he is to continue working aggressively on weight loss. Care Plan:  At this point, will arrange for the patient's machine to be changed over to auto mode for 2 weeks to optimize their pressure.  I will review the downloaded data once sent by dme, and also evaluate for compliance, leaks, and residual osa.  I will call the patient and dme to discuss the results, and have the patient's machine set appropriately.  This will serve as the pt's cpap pressure titration.

## 2012-09-27 ENCOUNTER — Encounter: Payer: Self-pay | Admitting: Internal Medicine

## 2012-09-27 ENCOUNTER — Ambulatory Visit: Payer: BC Managed Care – PPO | Admitting: Internal Medicine

## 2012-09-28 ENCOUNTER — Encounter: Payer: Self-pay | Admitting: *Deleted

## 2012-09-28 ENCOUNTER — Telehealth: Payer: Self-pay | Admitting: *Deleted

## 2012-09-28 MED ORDER — ATORVASTATIN CALCIUM 40 MG PO TABS: 40.0000 mg | ORAL_TABLET | Freq: Every day | ORAL | Status: DC

## 2012-09-28 MED ORDER — ZOLPIDEM TARTRATE 10 MG PO TABS: 10.0000 mg | ORAL_TABLET | Freq: Every evening | ORAL | Status: DC | PRN

## 2012-09-28 MED ORDER — SERTRALINE HCL 50 MG PO TABS: 50.0000 mg | ORAL_TABLET | Freq: Every day | ORAL | Status: DC

## 2012-09-28 NOTE — Telephone Encounter (Signed)
done

## 2012-09-28 NOTE — Telephone Encounter (Signed)
Pt made aware rx & controlled substance contract is ready to be picked up at front desk.  

## 2012-09-28 NOTE — Telephone Encounter (Signed)
Ok to refill zolpidem 10mg ? Last OV 3.19.14

## 2012-10-14 ENCOUNTER — Encounter: Payer: Self-pay | Admitting: Internal Medicine

## 2012-12-06 ENCOUNTER — Other Ambulatory Visit: Payer: Self-pay | Admitting: *Deleted

## 2012-12-06 MED ORDER — SERTRALINE HCL 50 MG PO TABS: 50.0000 mg | ORAL_TABLET | Freq: Every day | ORAL | Status: DC

## 2012-12-06 NOTE — Telephone Encounter (Signed)
Pharmacy has requested a 90 day supply of sertraline. Rx has been filled.

## 2012-12-07 ENCOUNTER — Other Ambulatory Visit: Payer: Self-pay | Admitting: *Deleted

## 2012-12-07 MED ORDER — ATORVASTATIN CALCIUM 40 MG PO TABS: 40.0000 mg | ORAL_TABLET | Freq: Every day | ORAL | Status: DC

## 2012-12-07 NOTE — Telephone Encounter (Signed)
Rx filled for atorvastatin 40 mg 90 ct 1 refill

## 2012-12-14 ENCOUNTER — Telehealth: Payer: Self-pay | Admitting: *Deleted

## 2012-12-14 ENCOUNTER — Other Ambulatory Visit: Payer: Self-pay | Admitting: *Deleted

## 2012-12-14 NOTE — Telephone Encounter (Signed)
Ok to refill? Last OV 3.19.14 Last filled 5.20.14 # 30 with 4 refills to local [facility]. [facility]

## 2012-12-14 NOTE — Telephone Encounter (Signed)
[facility]

## 2012-12-15 NOTE — Telephone Encounter (Signed)
lmovm to return call  °

## 2012-12-16 NOTE — Telephone Encounter (Signed)
lmovm to return call  °

## 2012-12-17 NOTE — Telephone Encounter (Signed)
lmovm to return call  °

## 2012-12-20 NOTE — Telephone Encounter (Signed)
Spoke with patient, states he was unaware [facility] refills available at [facility]. States he wishes to continue using [facility] for [facility] medication. Disregard refill request [facility]. Will close encounter.

## 2013-02-10 ENCOUNTER — Ambulatory Visit: Payer: BC Managed Care – PPO | Admitting: Pulmonary Disease

## 2013-03-07 ENCOUNTER — Telehealth: Payer: Self-pay

## 2013-03-07 NOTE — Telephone Encounter (Addendum)
Left message for call back Identifiable  Medication List and allergies: reviewed and updated  90 day supply/mail order: na Local prescriptions: [facility] [place]  Immunizations due: admin flu vaccine  A/P:   No FH or PSH changes Dx Sleep Apnea--Dr Chance [facility]--08/2008 tubulart ademona---q 5 years  PSA--02/2012 WNL   To Discuss with Provider: No questions at this time

## 2013-03-09 ENCOUNTER — Ambulatory Visit (INDEPENDENT_AMBULATORY_CARE_PROVIDER_SITE_OTHER): Payer: BC Managed Care – PPO | Admitting: Internal Medicine

## 2013-03-09 ENCOUNTER — Encounter: Payer: Self-pay | Admitting: Internal Medicine

## 2013-03-09 VITALS — BP 149/97 | HR 82 | Temp 99.0°F | Ht 69.0 in | Wt 252.0 lb

## 2013-03-09 DIAGNOSIS — Z0000 Encounter for general adult medical examination without abnormal findings: Secondary | ICD-10-CM

## 2013-03-09 DIAGNOSIS — F329 Major depressive disorder, single episode, unspecified: Secondary | ICD-10-CM

## 2013-03-09 DIAGNOSIS — Z23 Encounter for immunization: Secondary | ICD-10-CM

## 2013-03-09 DIAGNOSIS — C801 Malignant (primary) neoplasm, unspecified: Secondary | ICD-10-CM

## 2013-03-09 DIAGNOSIS — R030 Elevated blood-pressure reading, without diagnosis of hypertension: Secondary | ICD-10-CM

## 2013-03-09 LAB — COMPREHENSIVE METABOLIC PANEL WITH GFR
ALT: 34 U/L (ref 0–53)
AST: 29 U/L (ref 0–37)
Albumin: 4.1 g/dL (ref 3.5–5.2)
Alkaline Phosphatase: 98 U/L (ref 39–117)
BUN: 11 mg/dL (ref 6–23)
CO2: 28 meq/L (ref 19–32)
Calcium: 9.5 mg/dL (ref 8.4–10.5)
Chloride: 103 meq/L (ref 96–112)
Creatinine, Ser: 1.2 mg/dL (ref 0.4–1.5)
GFR: 69.38 mL/min
Glucose, Bld: 100 mg/dL — ABNORMAL HIGH (ref 70–99)
Potassium: 4.2 meq/L (ref 3.5–5.1)
Sodium: 142 meq/L (ref 135–145)
Total Bilirubin: 0.8 mg/dL (ref 0.3–1.2)
Total Protein: 7.2 g/dL (ref 6.0–8.3)

## 2013-03-09 LAB — CBC WITH DIFFERENTIAL/PLATELET
Basophils Absolute: 0.1 K/uL (ref 0.0–0.1)
Basophils Relative: 0.6 % (ref 0.0–3.0)
Eosinophils Absolute: 0.3 K/uL (ref 0.0–0.7)
Eosinophils Relative: 3.7 % (ref 0.0–5.0)
HCT: 46.7 % (ref 39.0–52.0)
Hemoglobin: 16.2 g/dL (ref 13.0–17.0)
Lymphocytes Relative: 22.1 % (ref 12.0–46.0)
Lymphs Abs: 1.9 K/uL (ref 0.7–4.0)
MCHC: 34.7 g/dL (ref 30.0–36.0)
MCV: 86.6 fl (ref 78.0–100.0)
Monocytes Absolute: 0.8 K/uL (ref 0.1–1.0)
Monocytes Relative: 9.2 % (ref 3.0–12.0)
Neutro Abs: 5.5 K/uL (ref 1.4–7.7)
Neutrophils Relative %: 64.4 % (ref 43.0–77.0)
Platelets: 209 K/uL (ref 150.0–400.0)
RBC: 5.39 Mil/uL (ref 4.22–5.81)
RDW: 13.9 % (ref 11.5–14.6)
WBC: 8.6 K/uL (ref 4.5–10.5)

## 2013-03-09 LAB — URINALYSIS, ROUTINE W REFLEX MICROSCOPIC
Leukocytes, UA: NEGATIVE
Nitrite: NEGATIVE
Total Protein, Urine: NEGATIVE
Urine Glucose: NEGATIVE
pH: 6 (ref 5.0–8.0)

## 2013-03-09 LAB — LIPID PANEL: Cholesterol: 241 mg/dL — ABNORMAL HIGH (ref 0–200)

## 2013-03-09 LAB — LDL CHOLESTEROL, DIRECT: Direct LDL: 173.4 mg/dL

## 2013-03-09 LAB — HEMOGLOBIN A1C: Hgb A1c MFr Bld: 6 % (ref 4.6–6.5)

## 2013-03-09 MED ORDER — ZOLPIDEM TARTRATE 10 MG PO TABS: 10.0000 mg | ORAL_TABLET | Freq: Every evening | ORAL | Status: DC | PRN

## 2013-03-09 NOTE — Assessment & Plan Note (Signed)
BP slightly elevated today, recommend self-monitoring and come back in 6 months

## 2013-03-09 NOTE — Assessment & Plan Note (Addendum)
Td 2013 Flu shot today colonoscopy  08/2008, tubular adenoma, next in 5 years LUTS: DRE WNL , check a PSA UA, if sx increase consider further eval ([facility]?)   patient has a FH of CAD ---->stress test neg 12-2009 Diet-exercise-wt loss: discussed today

## 2013-03-09 NOTE — Assessment & Plan Note (Signed)
+   Stress at work but managing well with medication. Refill Ambien

## 2013-03-09 NOTE — Assessment & Plan Note (Signed)
Sees dermatology from time to time

## 2013-03-09 NOTE — Patient Instructions (Signed)
Get your blood work before you leave  Next visit in 6 months for a BP , urinary sx  follow up (30 minutes). No Fasting Please make an appointment     Check the  blood pressure 2 or 3 times a month be sure it is between 110/60 and 140/85. Ideal blood pressure is 120/80. If it is consistently higher or lower, let me know

## 2013-03-09 NOTE — Progress Notes (Signed)
Subjective:    Patient ID: Oscar Fuller, male    DOB: 1955-09-05, 57 y.o.   MRN: [id number]  HPI CPX  Past Medical History  Diagnosis Date  . Hyperlipidemia   . Increased prostate specific antigen (PSA) velocity     saw Dr Etta Grandchild, s/p Bx(-) 2002  . SCC (squamous cell carcinoma)     Dr Londell Moh, in the back  . ED (erectile dysfunction) 07/2008    used some cialis, symptoms not active at present   . Hypertension     was on ACEi-diuretics  . Depression with anxiety   . Atypical chest pain     with ER visit in 8/11. ETT-myoview showed EF 61% no ischemia or infaraction 8/11  . OSA (obstructive sleep apnea) 06/08/2012    HST 05/2012:  AHI 34/hr with desat as low as 69%.    Past Surgical History  Procedure Laterality Date  . Vasectomy    . Knee surgery  2009    left arthroscopy   History   Social History  . Marital Status: Married    Spouse Name: N/A    Number of Children: 2  . Years of Education: N/A   Occupational History  . [employer]    Social History Main Topics  . Smoking status: Never Smoker   . Smokeless tobacco: Never Used  . Alcohol Use: No     Comment: occassional--rare  . Drug Use: No  . Sexual Activity: Not on file   Other Topics Concern  . Not on file   Social History Narrative   Married x 2  , 2nd wife Zella Ball has a h/o SZ d/o          Family History  Problem Relation Age of Onset  . Colon cancer Neg Hx   . Prostate cancer Neg Hx   . Coronary artery disease Father     stent 70, GF GM  . Heart attack Brother     F B M GM  . Hypertension Father     F B  . Lung cancer Father   . Glaucoma Mother   . Stroke Other     CVA GM  . Stomach cancer Paternal Grandfather   . Gout Brother   . Migraines Sister     Review of Systems Diet-- not very healthy Exercise-- occ exercises No  CP, SOB, lower extremity edema Denies  nausea, vomiting diarrhea Denies  blood in the stools Complains of urinary frequency, sometimes with change in the environment  temperature. Sometimes feels he can't empty his bladder completely No dysuria, gross hematuria; most o the time no difficulty urinating   Occasionally both hands get numb, usually at work when he is typing and has his elbows in the arm rest. No neck pain, no other paresthesias, no difficulty with his gait  . No nocturnal symptoms       Objective:   Physical Exam BP 149/97  Pulse 82  Temp(Src) 99 F (37.2 C)  Ht 5\' 9"  (1.753 m)  Wt 252 lb (114.306 kg)  BMI 37.2 kg/m2  SpO2 94% General -- alert, well-developed, NAD.  Neck --no TTP, FROM Lungs -- normal respiratory effort, no intercostal retractions, no accessory muscle use, and normal breath sounds.  Heart-- normal rate, regular rhythm, no murmur.  Abdomen-- Not distended, good bowel sounds,soft, non-tender. No rebound or rigidity. No mass,organomegaly. Rectal-- No external abnormalities noted. Normal sphincter tone. No rectal masses or tenderness. Brown stool, Hemoccult negative  Prostate--Prostate gland firm and  smooth, no enlargement, nodularity, tenderness, mass, asymmetry or induration. Extremities-- no pretibial edema bilaterally ; Hands with no muscle atrophy Neurologic--  alert & oriented X3. Speech normal, gait normal, strength normal in all extremities.  DTRs symmetric  Psych-- Cognition and judgment appear intact. Cooperative with normal attention span and concentration. No anxious appearing, no depressed appearing.      Assessment & Plan:  Hand paresthesias, observation for now, if symptoms increase will need further eval

## 2013-03-15 ENCOUNTER — Ambulatory Visit (INDEPENDENT_AMBULATORY_CARE_PROVIDER_SITE_OTHER): Payer: BC Managed Care – PPO | Admitting: Pulmonary Disease

## 2013-03-15 ENCOUNTER — Encounter: Payer: Self-pay | Admitting: Pulmonary Disease

## 2013-03-15 VITALS — BP 152/98 | HR 82 | Temp 97.9°F | Ht 69.0 in | Wt 252.8 lb

## 2013-03-15 DIAGNOSIS — G4733 Obstructive sleep apnea (adult) (pediatric): Secondary | ICD-10-CM

## 2013-03-15 NOTE — Assessment & Plan Note (Signed)
The patient is wearing CPAP, but has yet to have his pressure optimized.  He is describing air hunger, and we will try again to get him on the automatic setting.  We'll also try a smaller face mask that does not cover his mouth.  Finally, I have encouraged him to work aggressively on weight loss.

## 2013-03-15 NOTE — Patient Instructions (Signed)
Will get your pressure optimized for you on the auto setting.  Let us know if you prefer to stay on this permanently. Will send an order for you to try different masks.  If you continue having mouth opening on a nasal mask, can try a chin strap. Work on weight loss followup with me in one year if doing well, but let's keep in touch until we can get all of this figured out.

## 2013-03-15 NOTE — Progress Notes (Signed)
  Subjective:    Patient ID: Oscar Fuller, male    DOB: 02-05-1956, 57 y.o.   MRN: [id number]  HPI Patient comes in today for followup of his obstructive sleep apnea.  At the last visit, the plan was to optimize his pressure on the automatic setting.  Unfortunately his home care company never called him despite the order being sent.  He is still on a low pressure, and does feel that he has air hunger at times.  He has not really seen a big difference in his sleep or daytime alertness, but I explained that we have yet to optimize his pressure.  He is also having issues with his full face mask, and would like to try a nasal mask.   Review of Systems  Constitutional: Negative for fever and unexpected weight change.  HENT: Negative for congestion, dental problem, ear pain, nosebleeds, postnasal drip, rhinorrhea, sinus pressure, sneezing, sore throat and trouble swallowing.   Eyes: Negative for redness and itching.  Respiratory: Negative for cough, chest tightness, shortness of breath and wheezing.   Cardiovascular: Negative for palpitations and leg swelling.  Gastrointestinal: Negative for nausea and vomiting.  Genitourinary: Negative for dysuria.  Musculoskeletal: Negative for joint swelling.  Skin: Negative for rash.  Neurological: Negative for headaches.  Hematological: Does not bruise/bleed easily.  Psychiatric/Behavioral: Negative for dysphoric mood. The patient is not nervous/anxious.        Objective:   Physical Exam Overweight male in no acute distress Nose without purulence or discharge noted Neck without lymphadenopathy or thyromegaly No skin breakdown or pressure necrosis from the CPAP mask Lower extremities without edema, cyanosis Alert and oriented, does not appear to be sleepy, moves all 4 extremities.       Assessment & Plan:

## 2013-03-30 ENCOUNTER — Encounter: Payer: BC Managed Care – PPO | Admitting: Internal Medicine

## 2013-04-13 ENCOUNTER — Encounter: Payer: Self-pay | Admitting: Family Medicine

## 2013-04-13 ENCOUNTER — Ambulatory Visit (INDEPENDENT_AMBULATORY_CARE_PROVIDER_SITE_OTHER): Payer: BC Managed Care – PPO | Admitting: Family Medicine

## 2013-04-13 VITALS — BP 126/80 | HR 93 | Temp 98.7°F | Wt 254.2 lb

## 2013-04-13 DIAGNOSIS — J019 Acute sinusitis, unspecified: Secondary | ICD-10-CM

## 2013-04-13 MED ORDER — GUAIFENESIN-CODEINE 100-10 MG/5ML PO SYRP: 5.0000 mL | ORAL_SOLUTION | Freq: Three times a day (TID) | ORAL | Status: DC | PRN

## 2013-04-13 MED ORDER — AMOXICILLIN-POT CLAVULANATE 875-125 MG PO TABS: 1.0000 | ORAL_TABLET | Freq: Two times a day (BID) | ORAL | Status: DC

## 2013-04-13 MED ORDER — FLUTICASONE PROPIONATE 50 MCG/ACT NA SUSP: 2.0000 | Freq: Every day | NASAL | Status: DC

## 2013-04-13 NOTE — Progress Notes (Signed)
  Subjective:     Oscar Fuller is a 57 y.o. male who presents for evaluation of sinus pain. Symptoms include: congestion, cough, facial pain, nasal congestion and sinus pressure. Onset of symptoms was 1 week ago. Symptoms have been gradually worsening since that time. Past history is significant for no history of pneumonia or bronchitis. Patient is a non-smoker.  The following portions of the patient's history were reviewed and updated as appropriate: allergies, current medications, past family history, past medical history, past social history, past surgical history and problem list.  Review of Systems Pertinent items are noted in HPI.   Objective:    BP 126/80  Pulse 93  Temp(Src) 98.7 F (37.1 C) (Oral)  Wt 254 lb 3.2 oz (115.304 kg)  SpO2 99% General appearance: alert, cooperative, appears stated age and no distress Ears: normal TM's and external ear canals both ears Nose: green discharge, moderate congestion, turbinates red, swollen, sinus tenderness left Throat: abnormal findings: mild oropharyngeal erythema and pnd Neck: moderate anterior cervical adenopathy, supple, symmetrical, trachea midline and thyroid not enlarged, symmetric, no tenderness/mass/nodules Lungs: clear to auscultation bilaterally    Assessment:    Acute bacterial sinusitis.    Plan:    Nasal steroids per medication orders. Antihistamines per medication orders. Augmentin per medication orders. f/u prn

## 2013-04-13 NOTE — Patient Instructions (Signed)

## 2013-05-15 ENCOUNTER — Other Ambulatory Visit: Payer: Self-pay | Admitting: Pulmonary Disease

## 2013-05-15 DIAGNOSIS — G4733 Obstructive sleep apnea (adult) (pediatric): Secondary | ICD-10-CM

## 2013-06-11 ENCOUNTER — Other Ambulatory Visit: Payer: Self-pay | Admitting: Internal Medicine

## 2013-06-13 NOTE — Telephone Encounter (Signed)
Sertraline refilled per protocol. JG//CMA 

## 2013-06-15 ENCOUNTER — Encounter: Payer: Self-pay | Admitting: Internal Medicine

## 2013-07-01 ENCOUNTER — Encounter: Payer: Self-pay | Admitting: Gastroenterology

## 2013-07-10 ENCOUNTER — Other Ambulatory Visit: Payer: Self-pay | Admitting: Internal Medicine

## 2013-09-07 ENCOUNTER — Ambulatory Visit (INDEPENDENT_AMBULATORY_CARE_PROVIDER_SITE_OTHER): Payer: BC Managed Care – PPO | Admitting: Internal Medicine

## 2013-09-07 ENCOUNTER — Encounter: Payer: Self-pay | Admitting: Internal Medicine

## 2013-09-07 VITALS — BP 132/84 | HR 77 | Temp 98.2°F | Wt 250.0 lb

## 2013-09-07 DIAGNOSIS — F419 Anxiety disorder, unspecified: Principal | ICD-10-CM

## 2013-09-07 DIAGNOSIS — R3989 Other symptoms and signs involving the genitourinary system: Secondary | ICD-10-CM

## 2013-09-07 DIAGNOSIS — F329 Major depressive disorder, single episode, unspecified: Secondary | ICD-10-CM

## 2013-09-07 DIAGNOSIS — R399 Unspecified symptoms and signs involving the genitourinary system: Secondary | ICD-10-CM

## 2013-09-07 DIAGNOSIS — F341 Dysthymic disorder: Secondary | ICD-10-CM

## 2013-09-07 DIAGNOSIS — R739 Hyperglycemia, unspecified: Secondary | ICD-10-CM | POA: Insufficient documentation

## 2013-09-07 DIAGNOSIS — R7309 Other abnormal glucose: Secondary | ICD-10-CM

## 2013-09-07 DIAGNOSIS — R7303 Prediabetes: Secondary | ICD-10-CM | POA: Insufficient documentation

## 2013-09-07 DIAGNOSIS — E785 Hyperlipidemia, unspecified: Secondary | ICD-10-CM

## 2013-09-07 HISTORY — DX: Hyperglycemia, unspecified: R73.9

## 2013-09-07 HISTORY — DX: Prediabetes: R73.03

## 2013-09-07 LAB — URINALYSIS, ROUTINE W REFLEX MICROSCOPIC
Bilirubin Urine: NEGATIVE
Ketones, ur: NEGATIVE
LEUKOCYTES UA: NEGATIVE
Nitrite: NEGATIVE
Total Protein, Urine: NEGATIVE
UROBILINOGEN UA: 0.2 (ref 0.0–1.0)
Urine Glucose: NEGATIVE
pH: 6 (ref 5.0–8.0)

## 2013-09-07 LAB — LIPID PANEL
CHOL/HDL RATIO: 4
Cholesterol: 136 mg/dL (ref 0–200)
HDL: 36 mg/dL — ABNORMAL LOW (ref >39.00)
LDL CALC: 77 mg/dL (ref 0–99)
TRIGLYCERIDES: 116 mg/dL (ref 0.0–149.0)
VLDL: 23.2 mg/dL (ref 0.0–40.0)

## 2013-09-07 MED ORDER — TAMSULOSIN HCL 0.4 MG PO CAPS: 0.4000 mg | ORAL_CAPSULE | Freq: Every day | ORAL | Status: DC

## 2013-09-07 NOTE — Patient Instructions (Addendum)
Get your blood work before you leave   Next visit is for a physical exam in 6 months  , fasting Please make an appointment     ICE to right hip twice a day Motrin 200 mg 2 tablets every 6 hours as needed for pain. Always take it with food because may cause gastritis and ulcers. If you notice nausea, stomach pain, change in the color of stools --->  Stop the medicine and let us know

## 2013-09-07 NOTE — Assessment & Plan Note (Signed)
Repeat labs:

## 2013-09-07 NOTE — Assessment & Plan Note (Signed)
Occasional symptoms, we talked about possible increased medication, he declined, states is doing ok

## 2013-09-07 NOTE — Assessment & Plan Note (Signed)
A1c 6.0, DX prediabetes discussed with the patient.

## 2013-09-07 NOTE — Progress Notes (Signed)
Pre visit review using our clinic review tool, if applicable. No additional management support is needed unless otherwise documented below in the visit note. 

## 2013-09-07 NOTE — Progress Notes (Signed)
Subjective:    Patient ID: Oscar Fuller, male    DOB: [birth year], 58 y.o.   MRN: [id number]  DOS:  09/07/2013 Type of  visit: Routine office visit High cholesterol, good compliance with medication. Few days ago, he stepped of stairs wrong and since then is having lateral right hip pain particularly when he elevates his leg. Anxiety, good compliance with meds, occasionally symptoms increase a little. 2 months history of urinary urgency/frequency, sometimes related to cold weather, sometimes random.    ROS No fever or chills No abdominal or flank pain No dysuria, gross hematuria, some difficulty urinating : " it trickles down"  Past Medical History  Diagnosis Date  . Hyperlipidemia   . Increased prostate specific antigen (PSA) velocity     saw Dr Joelyn Oms, s/p Bx(-) 2002  . SCC (squamous cell carcinoma)     Dr Sherrye Payor, in the back  . ED (erectile dysfunction) 07/2008    used some cialis, symptoms not active at present   . Hypertension     was on ACEi-diuretics  . Depression with anxiety   . Atypical chest pain     with ER visit in 8/11. ETT-myoview showed EF 61% no ischemia or infaraction 8/11  . OSA (obstructive sleep apnea) 06/08/2012    HST 05/2012:  AHI 34/hr with desat as low as 69%.   . Prediabetes 09/07/2013    Past Surgical History  Procedure Laterality Date  . Vasectomy    . Knee surgery  2009    left arthroscopy    History   Social History  . Marital Status: Married    Spouse Name: N/A    Number of Children: 2  . Years of Education: N/A   Occupational History  . [employer]    Social History Main Topics  . Smoking status: Never Smoker   . Smokeless tobacco: Never Used  . Alcohol Use: No     Comment: occassional--rare  . Drug Use: No  . Sexual Activity: Not on file   Other Topics Concern  . Not on file   Social History Narrative   Married x 2  , 2nd wife [name] has a h/o SZ d/o               Medication List       This list is accurate as of:  09/07/13  5:59 PM.  Always use your most recent med list.               aspirin 81 MG tablet  Take 81 mg by mouth daily.     atorvastatin 40 MG tablet  Commonly known as:  LIPITOR  TAKE 1 TABLET DAILY     fluticasone 50 MCG/ACT nasal spray  Commonly known as:  FLONASE  Place 2 sprays into both nostrils daily.     sertraline 50 MG tablet  Commonly known as:  ZOLOFT  TAKE 1 TABLET DAILY     tamsulosin 0.4 MG Caps capsule  Commonly known as:  FLOMAX  Take 1 capsule (0.4 mg total) by mouth daily.     zolpidem 10 MG tablet  Commonly known as:  AMBIEN  Take 1 tablet (10 mg total) by mouth at bedtime as needed.           Objective:   Physical Exam BP 132/84  Pulse 77  Temp(Src) 98.2 F (36.8 C)  Wt 250 lb (113.399 kg)  SpO2 97%  General -- alert, well-developed, NAD.  Abdomen-- Not distended, good  bowel sounds,soft, non-tender. No TTP flanks Prostate--Prostate gland firm and smooth, no enlargement, nodularity, tenderness, mass, asymmetry or induration. Extremities-- no pretibial edema bilaterally ; slightly tender over the right trochanteric bursa, both hips have normal range of motion. Neurologic--  alert & oriented X3. Speech normal, gait w/ a slt limp, strength normal in all extremities.  DTRs symmetric  Psych-- Cognition and judgment appear intact. Cooperative with normal attention span and concentration. No anxious or depressed appearing.      Assessment & Plan:   Hip sprain, trochanteric bursitis? Recommend ice and Motrin as needed.

## 2013-09-07 NOTE — Assessment & Plan Note (Addendum)
LUTS described in the history of present illness, DRE few months ago and today normal  and totally normal, last PSA normal. Overactive bladder? Plan: Trial with Flomax, check UA and a urine culture

## 2013-09-09 LAB — URINE CULTURE
COLONY COUNT: NO GROWTH
ORGANISM ID, BACTERIA: NO GROWTH

## 2013-09-21 ENCOUNTER — Telehealth: Payer: Self-pay | Admitting: Internal Medicine

## 2013-09-21 ENCOUNTER — Encounter: Payer: Self-pay | Admitting: Family Medicine

## 2013-09-21 ENCOUNTER — Other Ambulatory Visit (INDEPENDENT_AMBULATORY_CARE_PROVIDER_SITE_OTHER): Payer: BC Managed Care – PPO

## 2013-09-21 ENCOUNTER — Ambulatory Visit (INDEPENDENT_AMBULATORY_CARE_PROVIDER_SITE_OTHER): Payer: BC Managed Care – PPO | Admitting: Family Medicine

## 2013-09-21 VITALS — BP 136/74 | HR 76 | Temp 98.2°F | Resp 16 | Wt 249.6 lb

## 2013-09-21 DIAGNOSIS — M25551 Pain in right hip: Secondary | ICD-10-CM

## 2013-09-21 DIAGNOSIS — M25559 Pain in unspecified hip: Secondary | ICD-10-CM

## 2013-09-21 DIAGNOSIS — S76011A Strain of muscle, fascia and tendon of right hip, initial encounter: Secondary | ICD-10-CM | POA: Insufficient documentation

## 2013-09-21 DIAGNOSIS — M7061 Trochanteric bursitis, right hip: Secondary | ICD-10-CM | POA: Insufficient documentation

## 2013-09-21 DIAGNOSIS — IMO0002 Reserved for concepts with insufficient information to code with codable children: Secondary | ICD-10-CM

## 2013-09-21 DIAGNOSIS — M76899 Other specified enthesopathies of unspecified lower limb, excluding foot: Secondary | ICD-10-CM

## 2013-09-21 MED ORDER — MELOXICAM 15 MG PO TABS: 15.0000 mg | ORAL_TABLET | Freq: Every day | ORAL | Status: DC

## 2013-09-21 MED ORDER — TRAMADOL HCL 50 MG PO TABS: 50.0000 mg | ORAL_TABLET | Freq: Three times a day (TID) | ORAL | Status: DC | PRN

## 2013-09-21 NOTE — Patient Instructions (Signed)
Nice to meet you Ice 20 minutes 2 times daily Exercises 3 times a week.  Wear tennis shoes for stability meloxicam daily for 10 days then as needed Tramadol at night if needed Come back again in 3 weeks.

## 2013-09-21 NOTE — Assessment & Plan Note (Signed)
Greater trochanteric bursitis as well as the gluteal tendon tear. Patient did respond very well to a injection today. We discussed icing protocol given home exercise program. We'll do a short course of anti-inflammatories as well as tramadol for breakthrough pain. Patient will come back in 3 weeks for further evaluation.

## 2013-09-21 NOTE — Telephone Encounter (Signed)
Spoke w/pt spouse, [name]. She states the patient was under the impression that the patient would come here for an injection. I explained that it must have been a misunderstanding because we do not do those types of injections here at our office. I made appt for patient to see Dr. Charlann Boxer @ 3:15pm today. Patient's wife is aware of appt and will try to get patient to go. They will call Dr. Thompson Caul office directly if they cannot make it.

## 2013-09-21 NOTE — Telephone Encounter (Signed)
Caller name: Bailey Mech  Relation to pt: wife Call back number:  [phone]   Reason for call:   Pt's wife called in stating that the pt is still having hip pain and it was discussed that he could have an injection if it was not better.  Pt and wife are leaving to go over seas tomorrow and want to know if they can come in this afternoon.

## 2013-09-21 NOTE — Progress Notes (Signed)
[facility] [address] [place], [place] [address] Phone: [phone] Subjective:    I'm seeing this patient by the request  of:  [name], MD  CC: hip pain  [id number] Oscar Fuller is a 58 y.o. male coming in with complaint of right hip pain. Patient states that this started approximately 3 weeks ago after trying to carry a box down stairs. Patient states it seems to be on the posterior lateral aspect of his hip. Worse when he tries to go from sitting to standing position or going up stairs. Patient denies any swelling or any numbness or any radiation down the leg. Patient states that it does hurt to palpation and can wake him up at night. Patient has tried over-the-counter medications without any significant improvement. Patient describes the pain as more of a sharp pain that is followed by a dull ache. Patient was the severity of 7/10. Patient is having a trip to [place] and will be doing a lot of walking in his concern secondary to the amount of pain he has. Denies any groin pain.     Past medical history, social, surgical and family history all reviewed in electronic medical record.   Review of Systems: No headache, visual changes, nausea, vomiting, diarrhea, constipation, dizziness, abdominal pain, skin rash, fevers, chills, night sweats, weight loss, swollen lymph nodes, body aches, joint swelling, muscle aches, chest pain, shortness of breath, mood changes.   Objective Blood pressure 136/74, pulse 76, temperature 98.2 F (36.8 C), temperature source Oral, resp. rate 16, weight 249 lb 9.6 oz (113.218 kg), SpO2 96.00%.  General: No apparent distress alert and oriented x3 mood and affect normal, dressed appropriately.  HEENT: Pupils equal, extraocular movements intact  Respiratory: Patient's speak in full sentences and does not appear short of breath  Cardiovascular: No lower extremity edema, non tender, no erythema  Skin: Warm dry intact with no  signs of infection or rash on extremities or on axial skeleton.  Abdomen: Soft nontender  Neuro: Cranial nerves II through XII are intact, neurovascularly intact in all extremities with 2+ DTRs and 2+ pulses.  Lymph: No lymphadenopathy of posterior or anterior cervical chain or axillae bilaterally.  Gait normal with good balance and coordination.  MSK:  Non tender with full range of motion and good stability and symmetric strength and tone of shoulders, elbows, wrist,  knee and ankles bilaterally.  Hip: right  ROM IR: 35 Deg, ER: 35 Deg, Flexion: 120 Deg, Extension: 100 Deg, Abduction: 45 Deg, Adduction: 45 Deg Strength IR: 5/5, ER: 5/5, Flexion: 5/5, Extension: 5/5, Abduction: 3/5, Adduction: 5/5 Pelvic alignment unremarkable to inspection and palpation. Standing hip rotation and gait without trendelenburg sign / unsteadiness. Greater trochanter with severe tenderness to palpation. Mild tenderness over piriformis and severe over greater trochanter. No pain with FABER or FADIR. No SI joint tenderness and normal minimal SI movement.  MSK US performed of: Right hip This study was ordered, performed, and interpreted by Charlann Boxer D.O.  Hip: Trochanteric bursa was significant amount of effusion. Patient insertion of the gluteus medius tendon there is a tear with some hypoechoic changes as well. Acetabular labrum visualized and without tears, displacement, or effusion in joint. Femoral neck appears unremarkable without increased power doppler signal along Cortex.  IMPRESSION:  Gluteal tendon tear with greater trochanteric bursitis   Procedure: Real-time Ultrasound Guided Injection of right greater trochanteric bursitis secondary to patient's body habitus Device: GE Logiq E  Ultrasound guided injection is  preferred based studies that show increased duration, increased effect, greater accuracy, decreased procedural pain, increased response rate, and decreased cost with ultrasound guided  versus blind injection.  Verbal informed consent obtained.  Time-out conducted.  Noted no overlying erythema, induration, or other signs of local infection.  Skin prepped in a sterile fashion.  Local anesthesia: Topical Ethyl chloride.  With sterile technique and under real time ultrasound guidance:  Greater trochanteric area was visualized and patient's bursa was noted. A 22-gauge 3 inch needle was inserted and 4 cc of 0.5% Marcaine and 1 cc of Kenalog 40 mg/dL was injected. Pictures taken Completed without difficulty  Pain immediately resolved suggesting accurate placement of the medication.  Advised to call if fevers/chills, erythema, induration, drainage, or persistent bleeding.  Images permanently stored and available for review in the ultrasound unit.  Impression: Technically successful ultrasound guided injection..    Impression and Recommendations:     This case required medical decision making of moderate complexity.

## 2013-09-21 NOTE — Telephone Encounter (Signed)
I suspect the trochanteric bursitis, a local  injection may help.  [place], See if sports medicine or orthopedic surgery could see him. Please notice they will make the decision whether or not a local injection is needed, is not my decision.

## 2013-09-21 NOTE — Progress Notes (Signed)
Pre-visit discussion using our clinic review tool. No additional management support is needed unless otherwise documented below in the visit note.  

## 2013-09-22 ENCOUNTER — Telehealth: Payer: Self-pay | Admitting: *Deleted

## 2013-09-22 MED ORDER — MELOXICAM 15 MG PO TABS: 15.0000 mg | ORAL_TABLET | Freq: Every day | ORAL | Status: DC

## 2013-09-22 NOTE — Telephone Encounter (Signed)
Refill done.  

## 2013-09-23 ENCOUNTER — Telehealth: Payer: Self-pay

## 2013-09-23 NOTE — Telephone Encounter (Signed)
UDS: 09/09/2013 Negative for ambien (PRN) Per Dr Larose Kells, low risk

## 2013-10-05 ENCOUNTER — Encounter: Payer: Self-pay | Admitting: Family Medicine

## 2013-10-05 ENCOUNTER — Ambulatory Visit (INDEPENDENT_AMBULATORY_CARE_PROVIDER_SITE_OTHER): Payer: BC Managed Care – PPO | Admitting: Family Medicine

## 2013-10-05 ENCOUNTER — Ambulatory Visit (INDEPENDENT_AMBULATORY_CARE_PROVIDER_SITE_OTHER)
Admission: RE | Admit: 2013-10-05 | Discharge: 2013-10-05 | Disposition: A | Discharge status: Home / Self Care | Payer: BC Managed Care – PPO | Source: Ambulatory Visit | Attending: Family Medicine | Admitting: Family Medicine

## 2013-10-05 DIAGNOSIS — M25551 Pain in right hip: Secondary | ICD-10-CM

## 2013-10-05 DIAGNOSIS — M25559 Pain in unspecified hip: Secondary | ICD-10-CM

## 2013-10-05 DIAGNOSIS — S76011A Strain of muscle, fascia and tendon of right hip, initial encounter: Secondary | ICD-10-CM

## 2013-10-05 DIAGNOSIS — IMO0002 Reserved for concepts with insufficient information to code with codable children: Secondary | ICD-10-CM

## 2013-10-05 MED ORDER — TRAMADOL HCL 50 MG PO TABS: 50.0000 mg | ORAL_TABLET | Freq: Three times a day (TID) | ORAL | Status: DC | PRN

## 2013-10-05 NOTE — Patient Instructions (Signed)
Good to see you 72 hours no exercises  Tennis ball in back right pocket.  We will get xrays downstairs today.  Try the topical 2 times a day and call me if you want a prescription.  Ice 20 minutes 2 times daily.  Tramadol up to 3 times daily.  Come back in 2 weeks.

## 2013-10-05 NOTE — Assessment & Plan Note (Signed)
Due to the patient's pain and not responding to the injection I think patient's actually strain is likely more of a tear but is not taking up on the ultrasound. Patient is going to try compression shorts, icing protocol and take a 72 hour hiatus from exercises. Patient and will start a home exercise program again. We discussed formal physical therapy which patient declined. Patient was given tramadol and will try topical anti-inflammatory. Patient is try these interventions and come back in 2 weeks for further evaluation.  Spent greater than 25 minutes with patient face-to-face and had greater than 50% of counseling including as described above in assessment and plan.

## 2013-10-05 NOTE — Progress Notes (Signed)
  [facility] [address] [place], [place] [address] Phone: [phone] Subjective:    CC: hip pain follow up  [id number] Oscar Fuller is a 58 y.o. male coming in for follow up of hip pain. Patient was seen previously and was diagnosed with greater trochanteric bursitis as well as a gluteal tendon muscle strain versus tear. Patient was given a steroid injection, home exercise program, icing as well as anti-inflammatories. Patient states  that the injection that he was given a health for about a week. Patient states unfortunately that the pain continued. Patient has not been doing home exercises and states that for meloxicam that was given to him has not made any significant improvement. Patient states once he starts walking the pain seems to be fine but when he goes from sitting to standing position that seems to be the most severe pain. Patient states it is still localized over the lateral aspect of the hip. Denies any radiation down the leg and denies any back pain and is associated with this. Patient also denies any fevers or chills.     Past medical history, social, surgical and family history all reviewed in electronic medical record.   Review of Systems: No headache, visual changes, nausea, vomiting, diarrhea, constipation, dizziness, abdominal pain, skin rash, fevers, chills, night sweats, weight loss, swollen lymph nodes, body aches, joint swelling, muscle aches, chest pain, shortness of breath, mood changes.   Objective   General: No apparent distress alert and oriented x3 mood and affect normal, dressed appropriately.  HEENT: Pupils equal, extraocular movements intact  Respiratory: Patient's speak in full sentences and does not appear short of breath  Cardiovascular: No lower extremity edema, non tender, no erythema  Skin: Warm dry intact with no signs of infection or rash on extremities or on axial skeleton.  Abdomen: Soft nontender  Neuro:  Cranial nerves II through XII are intact, neurovascularly intact in all extremities with 2+ DTRs and 2+ pulses.  Lymph: No lymphadenopathy of posterior or anterior cervical chain or axillae bilaterally.  Gait normal with good balance and coordination.  MSK:  Non tender with full range of motion and good stability and symmetric strength and tone of shoulders, elbows, wrist,  knee and ankles bilaterally.  Hip: right  ROM IR: 35 Deg, ER: 35 Deg, Flexion: 120 Deg, Extension: 100 Deg, Abduction: 45 Deg, Adduction: 45 Deg Strength IR: 5/5, ER: 5/5, Flexion: 5/5, Extension: 5/5, Abduction: 3/5, Adduction: 5/5 Pelvic alignment unremarkable to inspection and palpation. Standing hip rotation and gait without trendelenburg sign / unsteadiness. Greater trochanter with severe tenderness to palpation. Mild tenderness over piriformis and severe over greater trochanter area in the insertion of the gluteal tendon No pain with FABER or FADIR. No SI joint tenderness and normal minimal SI movement.  X-rays have been ordered to rule out bony abnormality.   Impression and Recommendations:     This case required medical decision making of moderate complexity.

## 2013-10-12 ENCOUNTER — Ambulatory Visit: Payer: BC Managed Care – PPO | Admitting: Family Medicine

## 2013-10-12 ENCOUNTER — Encounter: Payer: Self-pay | Admitting: Internal Medicine

## 2013-10-20 ENCOUNTER — Ambulatory Visit: Payer: BC Managed Care – PPO | Admitting: Family Medicine

## 2013-10-24 ENCOUNTER — Ambulatory Visit (INDEPENDENT_AMBULATORY_CARE_PROVIDER_SITE_OTHER): Payer: BC Managed Care – PPO | Admitting: Family Medicine

## 2013-10-24 ENCOUNTER — Encounter: Payer: Self-pay | Admitting: Family Medicine

## 2013-10-24 VITALS — BP 140/84 | HR 82 | Ht 69.0 in | Wt 244.0 lb

## 2013-10-24 DIAGNOSIS — M7061 Trochanteric bursitis, right hip: Secondary | ICD-10-CM

## 2013-10-24 DIAGNOSIS — M76899 Other specified enthesopathies of unspecified lower limb, excluding foot: Secondary | ICD-10-CM

## 2013-10-24 MED ORDER — NITROGLYCERIN 0.2 MG/HR TD PT24: MEDICATED_PATCH | TRANSDERMAL | Status: DC

## 2013-10-24 MED ORDER — DICLOFENAC SODIUM 2 % TD SOLN: 2.0000 "application " | Freq: Two times a day (BID) | TRANSDERMAL | Status: DC

## 2013-10-24 NOTE — Patient Instructions (Signed)
Good to see you Try the topical anti-inflammatory 2 times daily.  Tennis ball in back right pocket at all times Ice is still your best friend.  Nitroglycerin Protocol   Apply 1/4 nitroglycerin patch to affected area daily.  Change position of patch within the affected area every 24 hours.  You may experience a headache during the first 1-2 weeks of using the patch, these should subside.  If you experience headaches after beginning nitroglycerin patch treatment, you may take your preferred over the counter pain reliever.  Another side effect of the nitroglycerin patch is skin irritation or rash related to patch adhesive.  Please notify our office if you develop more severe headaches or rash, and stop the patch.  Tendon healing with nitroglycerin patch may require 12 to 24 weeks depending on the extent of injury.  Men should not use if taking Viagra, Cialis, or Levitra.   Do not use if you have migraines or rosacea.   See me again in 3 weeks.

## 2013-10-24 NOTE — Assessment & Plan Note (Addendum)
Patient continues to not make any significant improvement. 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 I do believe though that patient still has more of a tendinopathy as well as a bursitis of the lateral aspect of the hip that is causing him his pain. We might try topical anti-inflammatory as well as nitroglycerin patches. Patient was warned the potential side effects. In addition this patient is going to continue with the icing regimen and a home exercise program. Patient did decline formal physical.  Patient will try these in the next but if he does not make improvement we'll consider formal physical therapy as well as repeat injection Spent greater than 25 minutes with patient face-to-face and had greater than 50% of counseling including as described above in assessment and plan.

## 2013-10-24 NOTE — Progress Notes (Signed)
  [facility] [address] [place], [place] [address] Phone: [phone] Subjective:    CC: hip pain follow up  [id number] Oscar Fuller is a 58 y.o. male coming in for follow up of hip pain. Patient was seen previously and was diagnosed with greater trochanteric bursitis. Patient was given injection one month ago. Patient states that it still hurts on the lateral aspect of his hip. Patient states that it affects all his daily activity. Patient is able to him really without much pain but after sitting for a long amount of time he does walk with a limp. Patient has been doing exercises as well as icing. Patient is concerned overall. Denies any significant nighttime awakening.     Past medical history, social, surgical and family history all reviewed in electronic medical record.   Review of Systems: No headache, visual changes, nausea, vomiting, diarrhea, constipation, dizziness, abdominal pain, skin rash, fevers, chills, night sweats, weight loss, swollen lymph nodes, body aches, joint swelling, muscle aches, chest pain, shortness of breath, mood changes.   Objective  Blood pressure 140/84, pulse 82, height 5\' 9"  (1.753 m), weight 244 lb (110.678 kg), SpO2 95.00%.   General: No apparent distress alert and oriented x3 mood and affect normal, dressed appropriately.  HEENT: Pupils equal, extraocular movements intact  Respiratory: Patient's speak in full sentences and does not appear short of breath  Cardiovascular: No lower extremity edema, non tender, no erythema  Skin: Warm dry intact with no signs of infection or rash on extremities or on axial skeleton.  Abdomen: Soft nontender  Neuro: Cranial nerves II through XII are intact, neurovascularly intact in all extremities with 2+ DTRs and 2+ pulses.  Lymph: No lymphadenopathy of posterior or anterior cervical chain or axillae bilaterally.  Gait normal with good balance and coordination.  MSK:  Non tender  with full range of motion and good stability and symmetric strength and tone of shoulders, elbows, wrist,  knee and ankles bilaterally.  Hip: right  ROM IR: 35 Deg, ER: 35 Deg, Flexion: 120 Deg, Extension: 100 Deg, Abduction: 45 Deg, Adduction: 45 Deg Strength IR: 5/5, ER: 5/5, Flexion: 5/5, Extension: 5/5, Abduction: 3/5, Adduction: 5/5 Pelvic alignment unremarkable to inspection and palpation. Standing hip rotation and gait without trendelenburg sign / unsteadiness. Greater trochanter with severe tenderness to palpation. Mild tenderness over piriformis and severe over greater trochanter area in the insertion of the gluteal tendon Positive pain with Corky Sox No SI joint tenderness and normal minimal SI movement.  X-rays have been ordered to rule out bony abnormality.   Impression and Recommendations:     This case required medical decision making of moderate complexity.

## 2013-11-09 DIAGNOSIS — I2699 Other pulmonary embolism without acute cor pulmonale: Secondary | ICD-10-CM | POA: Insufficient documentation

## 2013-11-09 HISTORY — DX: Other pulmonary embolism without acute cor pulmonale: I26.99

## 2013-11-17 ENCOUNTER — Other Ambulatory Visit: Payer: Self-pay | Admitting: Internal Medicine

## 2013-11-17 ENCOUNTER — Ambulatory Visit: Payer: BC Managed Care – PPO | Admitting: Family Medicine

## 2013-11-24 ENCOUNTER — Telehealth: Payer: Self-pay | Admitting: *Deleted

## 2013-11-24 NOTE — Telephone Encounter (Signed)
Pt is requesting a copy of his xray  u/s that was done with Dr. Tamala Julian. He stated his hip is not getting better going to see orthopedic md. Inform pt will leave for pick-up...Johny Chess

## 2013-12-09 ENCOUNTER — Encounter: Payer: Self-pay | Admitting: Medical

## 2013-12-09 ENCOUNTER — Ambulatory Visit (INDEPENDENT_AMBULATORY_CARE_PROVIDER_SITE_OTHER): Payer: BC Managed Care – PPO | Admitting: Medical

## 2013-12-09 ENCOUNTER — Inpatient Hospital Stay (HOSPITAL_BASED_OUTPATIENT_CLINIC_OR_DEPARTMENT_OTHER)
Admission: EM | Admit: 2013-12-09 | Discharge: 2013-12-11 | DRG: 176 | Disposition: A | Discharge status: Home / Self Care | Payer: BC Managed Care – PPO | Attending: Internal Medicine | Admitting: Internal Medicine

## 2013-12-09 ENCOUNTER — Encounter (HOSPITAL_BASED_OUTPATIENT_CLINIC_OR_DEPARTMENT_OTHER): Payer: Self-pay | Admitting: Emergency Medicine

## 2013-12-09 ENCOUNTER — Emergency Department (HOSPITAL_BASED_OUTPATIENT_CLINIC_OR_DEPARTMENT_OTHER): Payer: BC Managed Care – PPO

## 2013-12-09 VITALS — BP 145/79 | HR 83 | Temp 98.2°F | Wt 241.0 lb

## 2013-12-09 DIAGNOSIS — I2699 Other pulmonary embolism without acute cor pulmonale: Principal | ICD-10-CM | POA: Diagnosis present

## 2013-12-09 DIAGNOSIS — F329 Major depressive disorder, single episode, unspecified: Secondary | ICD-10-CM | POA: Diagnosis present

## 2013-12-09 DIAGNOSIS — R0789 Other chest pain: Secondary | ICD-10-CM

## 2013-12-09 DIAGNOSIS — I82409 Acute embolism and thrombosis of unspecified deep veins of unspecified lower extremity: Secondary | ICD-10-CM | POA: Diagnosis present

## 2013-12-09 DIAGNOSIS — M76899 Other specified enthesopathies of unspecified lower limb, excluding foot: Secondary | ICD-10-CM

## 2013-12-09 DIAGNOSIS — F3289 Other specified depressive episodes: Secondary | ICD-10-CM | POA: Diagnosis present

## 2013-12-09 DIAGNOSIS — E785 Hyperlipidemia, unspecified: Secondary | ICD-10-CM | POA: Diagnosis present

## 2013-12-09 DIAGNOSIS — I10 Essential (primary) hypertension: Secondary | ICD-10-CM | POA: Diagnosis present

## 2013-12-09 DIAGNOSIS — M7061 Trochanteric bursitis, right hip: Secondary | ICD-10-CM | POA: Diagnosis present

## 2013-12-09 DIAGNOSIS — Z7982 Long term (current) use of aspirin: Secondary | ICD-10-CM

## 2013-12-09 DIAGNOSIS — Z79899 Other long term (current) drug therapy: Secondary | ICD-10-CM

## 2013-12-09 DIAGNOSIS — R739 Hyperglycemia, unspecified: Secondary | ICD-10-CM | POA: Diagnosis present

## 2013-12-09 DIAGNOSIS — R079 Chest pain, unspecified: Secondary | ICD-10-CM

## 2013-12-09 DIAGNOSIS — F32A Depression, unspecified: Secondary | ICD-10-CM

## 2013-12-09 DIAGNOSIS — G4733 Obstructive sleep apnea (adult) (pediatric): Secondary | ICD-10-CM | POA: Diagnosis present

## 2013-12-09 DIAGNOSIS — Z85828 Personal history of other malignant neoplasm of skin: Secondary | ICD-10-CM

## 2013-12-09 DIAGNOSIS — R7303 Prediabetes: Secondary | ICD-10-CM | POA: Diagnosis present

## 2013-12-09 DIAGNOSIS — R7309 Other abnormal glucose: Secondary | ICD-10-CM | POA: Diagnosis present

## 2013-12-09 DIAGNOSIS — E669 Obesity, unspecified: Secondary | ICD-10-CM

## 2013-12-09 DIAGNOSIS — R071 Chest pain on breathing: Secondary | ICD-10-CM

## 2013-12-09 DIAGNOSIS — F419 Anxiety disorder, unspecified: Secondary | ICD-10-CM

## 2013-12-09 DIAGNOSIS — F411 Generalized anxiety disorder: Secondary | ICD-10-CM | POA: Diagnosis present

## 2013-12-09 HISTORY — DX: Other pulmonary embolism without acute cor pulmonale: I26.99

## 2013-12-09 LAB — CBC
HCT: 44.2 % (ref 39.0–52.0)
Hemoglobin: 15 g/dL (ref 13.0–17.0)
MCH: 29.9 pg (ref 26.0–34.0)
MCHC: 33.9 g/dL (ref 30.0–36.0)
MCV: 88.2 fL (ref 78.0–100.0)
PLATELETS: 201 10*3/uL (ref 150–400)
RBC: 5.01 MIL/uL (ref 4.22–5.81)
RDW: 13.4 % (ref 11.5–15.5)
WBC: 11.8 10*3/uL — AB (ref 4.0–10.5)

## 2013-12-09 LAB — BASIC METABOLIC PANEL
Anion gap: 13 (ref 5–15)
BUN: 16 mg/dL (ref 6–23)
CHLORIDE: 104 meq/L (ref 96–112)
CO2: 25 mEq/L (ref 19–32)
CREATININE: 1.2 mg/dL (ref 0.50–1.35)
Calcium: 9.7 mg/dL (ref 8.4–10.5)
GFR calc Af Amer: 76 mL/min — ABNORMAL LOW (ref >90)
GFR calc non Af Amer: 66 mL/min — ABNORMAL LOW (ref >90)
Glucose, Bld: 97 mg/dL (ref 70–99)
Potassium: 4.1 mEq/L (ref 3.7–5.3)
SODIUM: 142 meq/L (ref 137–147)

## 2013-12-09 LAB — TROPONIN I: Troponin I: <0.3 ng/mL (ref <0.30)

## 2013-12-09 LAB — MRSA PCR SCREENING: MRSA by PCR: NEGATIVE

## 2013-12-09 MED ORDER — HYDROMORPHONE HCL PF 1 MG/ML IJ SOLN: 0.5000 mg | INTRAMUSCULAR | Status: DC | PRN

## 2013-12-09 MED ORDER — ALUM & MAG HYDROXIDE-SIMETH 200-200-20 MG/5ML PO SUSP: 30.0000 mL | Freq: Four times a day (QID) | ORAL | Status: DC | PRN

## 2013-12-09 MED ORDER — ACETAMINOPHEN 650 MG RE SUPP: 650.0000 mg | Freq: Four times a day (QID) | RECTAL | Status: DC | PRN

## 2013-12-09 MED ORDER — SERTRALINE HCL 50 MG PO TABS
50.0000 mg | ORAL_TABLET | Freq: Every day | ORAL | Status: DC
  Administered 2013-12-10 – 2013-12-11 (×2): 50 mg via ORAL
  Filled 2013-12-09 (×2): qty 1

## 2013-12-09 MED ORDER — SODIUM CHLORIDE 0.9 % IJ SOLN
3.0000 mL | Freq: Two times a day (BID) | INTRAMUSCULAR | Status: DC
  Administered 2013-12-10 (×2): 3 mL via INTRAVENOUS

## 2013-12-09 MED ORDER — ACETAMINOPHEN 325 MG PO TABS
650.0000 mg | ORAL_TABLET | Freq: Four times a day (QID) | ORAL | Status: DC | PRN
  Administered 2013-12-10: 650 mg via ORAL
  Filled 2013-12-09: qty 2

## 2013-12-09 MED ORDER — OXYCODONE HCL 5 MG PO TABS
5.0000 mg | ORAL_TABLET | ORAL | Status: DC | PRN
  Filled 2013-12-09 (×2): qty 1

## 2013-12-09 MED ORDER — HEPARIN (PORCINE) IN NACL 100-0.45 UNIT/ML-% IJ SOLN
1550.0000 [IU]/h | INTRAMUSCULAR | Status: DC
  Administered 2013-12-09: 1000 [IU]/h via INTRAVENOUS
  Filled 2013-12-09 (×2): qty 250

## 2013-12-09 MED ORDER — ATORVASTATIN CALCIUM 40 MG PO TABS
40.0000 mg | ORAL_TABLET | Freq: Every day | ORAL | Status: DC
Start: 2013-12-10 — End: 2013-12-11
  Administered 2013-12-10: 40 mg via ORAL
  Filled 2013-12-09 (×2): qty 1

## 2013-12-09 MED ORDER — ASPIRIN EC 81 MG PO TBEC
81.0000 mg | DELAYED_RELEASE_TABLET | Freq: Every day | ORAL | Status: DC
  Administered 2013-12-10: 81 mg via ORAL
  Filled 2013-12-09 (×2): qty 1

## 2013-12-09 MED ORDER — TAMSULOSIN HCL 0.4 MG PO CAPS
0.4000 mg | ORAL_CAPSULE | Freq: Every day | ORAL | Status: DC
  Administered 2013-12-10 – 2013-12-11 (×2): 0.4 mg via ORAL
  Filled 2013-12-09 (×2): qty 1

## 2013-12-09 MED ORDER — ONDANSETRON HCL 4 MG/2ML IJ SOLN
4.0000 mg | Freq: Four times a day (QID) | INTRAMUSCULAR | Status: DC | PRN
Start: 2013-12-09 — End: 2013-12-11

## 2013-12-09 MED ORDER — IOHEXOL 350 MG/ML SOLN
100.0000 mL | Freq: Once | INTRAVENOUS | Status: AC | PRN
  Administered 2013-12-09: 100 mL via INTRAVENOUS

## 2013-12-09 MED ORDER — HEPARIN BOLUS VIA INFUSION
4000.0000 [IU] | Freq: Once | INTRAVENOUS | Status: AC
  Administered 2013-12-09: 4000 [IU] via INTRAVENOUS

## 2013-12-09 MED ORDER — ZOLPIDEM TARTRATE 5 MG PO TABS
10.0000 mg | ORAL_TABLET | Freq: Every evening | ORAL | Status: DC | PRN
  Administered 2013-12-10: 10 mg via ORAL
  Filled 2013-12-09: qty 2

## 2013-12-09 MED ORDER — CYCLOBENZAPRINE HCL 10 MG PO TABS: 5.0000 mg | ORAL_TABLET | Freq: Three times a day (TID) | ORAL | Status: DC | PRN

## 2013-12-09 MED ORDER — ONDANSETRON HCL 4 MG PO TABS: 4.0000 mg | ORAL_TABLET | Freq: Four times a day (QID) | ORAL | Status: DC | PRN

## 2013-12-09 MED ORDER — SODIUM CHLORIDE 0.9 % IV SOLN: 250.0000 mL | INTRAVENOUS | Status: DC | PRN

## 2013-12-09 MED ORDER — FLUTICASONE PROPIONATE 50 MCG/ACT NA SUSP
2.0000 | Freq: Every day | NASAL | Status: DC
  Filled 2013-12-09: qty 16

## 2013-12-09 MED ORDER — SODIUM CHLORIDE 0.9 % IJ SOLN: 3.0000 mL | INTRAMUSCULAR | Status: DC | PRN

## 2013-12-09 NOTE — Progress Notes (Signed)
Pre visit review using our clinic review tool, if applicable. No additional management support is needed unless otherwise documented below in the visit note. 

## 2013-12-09 NOTE — H&P (Signed)
[facility] Admission History and Physical       [name] GEZ:[id number] DOB: 1956/02/11 DOA: 12/09/2013  Referring physician:  [name] PCP: [name], MD  Specialists:   Chief Complaint:  Right Sided Chest Pain  HPI: Oscar Fuller is a 58 y.o. male with a history of hyperlipidemia who was seen by his PCP today and referred to the ED due to continued intermittent right sided chest pain since Wednesday.   He describes having 10/10 pain on Wednesday, and the pain is worse with a deep breath.   He has pain as well when he coughs or sneezes.    He has had SOB, and denies having any fever or chills.    He was evaluated in the ED and [facility] and found to have Bilateral Pulmonary Emboli with mild right heart strain on CTA of the chest.      He reports driving to [place] a week ago.  He denies having any family history of clotting disorders and he denies having any similar problems in the past.   He also has had severe pain in his right hip for several months and is in the process of seeing Orthopedics   Review of Systems:  Constitutional: No Weight Loss, No Weight Gain, Night Sweats, Fevers, Chills, Dizziness, Fatigue, or Generalized Weakness HEENT: No Headaches, Difficulty Swallowing,Tooth/Dental Problems,Sore Throat,  No Sneezing, Rhinitis, Ear Ache, Nasal Congestion, or Post Nasal Drip,  Cardio-vascular:  +Right sided Chest pain, Orthopnea, PND, Edema in Lower Extremities, Anasarca, Dizziness, Palpitations  Resp: +Dyspnea, No DOE, No Productive Cough, No Non-Productive Cough, No Hemoptysis, No Wheezing.    GI: No Heartburn, Indigestion, Abdominal Pain, Nausea, Vomiting, Diarrhea, Hematemesis, Hematochezia, Melena, Change in Bowel Habits,  Loss of Appetite  GU: No Dysuria, Change in Color of Urine, No Urgency or Frequency, No Flank pain.  Musculoskeletal: No Joint Pain or Swelling, No Decreased Range of Motion, No Back Pain.  Neurologic: No Syncope, No Seizures, Muscle Weakness,  Paresthesia, Vision Disturbance or Loss, No Diplopia, No Vertigo, No Difficulty Walking,  Skin: No Rash or Lesions. Psych: No Change in Mood or Affect, No Depression or Anxiety, No Memory loss, No Confusion, or Hallucinations   Past Medical History  Diagnosis Date  . Hyperlipidemia   . Increased prostate specific antigen (PSA) velocity     saw Dr Joelyn Oms, s/p Bx(-) 2002  . SCC (squamous cell carcinoma)     Dr Sherrye Payor, in the back  . ED (erectile dysfunction) 07/2008    used some cialis, symptoms not active at present   . Hypertension     was on ACEi-diuretics  . Depression with anxiety   . Atypical chest pain     with ER visit in 8/11. ETT-myoview showed EF 61% no ischemia or infaraction 8/11  . OSA (obstructive sleep apnea) 06/08/2012    HST 05/2012:  AHI 34/hr with desat as low as 69%.   . Prediabetes 09/07/2013      Past Surgical History  Procedure Laterality Date  . Vasectomy    . Knee surgery  2009    left arthroscopy       Prior to Admission medications   Medication Sig Start Date End Date Taking? Authorizing Provider  aspirin 81 MG tablet Take 81 mg by mouth daily.      Historical Provider, MD  atorvastatin (LIPITOR) 40 MG tablet TAKE 1 TABLET DAILY    Colon Branch, MD  cyclobenzaprine (FLEXERIL) 5 MG tablet Take 5 mg by  mouth 3 (three) times daily as needed for muscle spasms.    Historical Provider, MD  Diclofenac Sodium (PENNSAID) 2 % SOLN Place 2 application onto the skin 2 (two) times daily. 10/24/13   Lyndal Pulley, DO  etodolac (LODINE) 500 MG tablet Take 500 mg by mouth 2 (two) times daily.    Historical Provider, MD  fluticasone (FLONASE) 50 MCG/ACT nasal spray Place 2 sprays into both nostrils daily. 04/13/13   Rosalita Chessman, DO  HYDROcodone-acetaminophen (NORCO/VICODIN) 5-325 MG per tablet Take 1 tablet by mouth every 6 (six) hours as needed for moderate pain.    Historical Provider, MD  meloxicam (MOBIC) 15 MG tablet Take 1 tablet (15 mg total) by mouth daily.  09/22/13   Lyndal Pulley, DO  nitroGLYCERIN (NITRODUR - DOSED IN MG/24 HR) 0.2 mg/hr patch 1/4 patch daily 10/24/13   Lyndal Pulley, DO  sertraline (ZOLOFT) 50 MG tablet TAKE 1 TABLET DAILY 11/17/13   Colon Branch, MD  tamsulosin (FLOMAX) 0.4 MG CAPS capsule Take 1 capsule (0.4 mg total) by mouth daily. 09/07/13   Colon Branch, MD  traMADol (ULTRAM) 50 MG tablet Take 1 tablet (50 mg total) by mouth every 8 (eight) hours as needed. 10/05/13   Lyndal Pulley, DO  zolpidem (AMBIEN) 10 MG tablet Take 1 tablet (10 mg total) by mouth at bedtime as needed. 03/09/13   Colon Branch, MD     No Known Allergies   Social History:  reports that he has never smoked. He has never used smokeless tobacco. He reports that he does not drink alcohol or use illicit drugs.     Family History  Problem Relation Age of Onset  . Colon cancer Neg Hx   . Prostate cancer Neg Hx   . Coronary artery disease Father     stent 19, GF GM  . Heart attack Brother     F B M GM  . Hypertension Father     F B  . Lung cancer Father   . Glaucoma Mother   . Stroke Other     CVA GM  . Stomach cancer Paternal Grandfather   . Gout Brother   . Migraines Sister       Physical Exam:  GEN: Pleasant Obese 58 y.o. Caucasian male examined  and in no acute distress; cooperative with exam Filed Vitals:   12/09/13 2130 12/09/13 2145 12/09/13 2200 12/09/13 2215  BP: 153/103 139/100 142/92 152/103  Pulse: 102 98 90 95  Temp:      TempSrc:      Resp: 28 19 25 26   Height:      Weight:      SpO2: 94% 92% 97% 94%   Blood pressure 152/103, pulse 95, temperature 98.8 F (37.1 C), temperature source Oral, resp. rate 26, height 5\' 9"  (1.753 m), weight 108.4 kg (238 lb 15.7 oz), SpO2 94.00%. PSYCH: He is alert and oriented x4; does not appear anxious does not appear depressed; affect is normal HEENT: Normocephalic and Atraumatic, Mucous membranes pink; PERRLA; EOM intact; Fundi:  Benign;  No scleral icterus, Nares: Patent, Oropharynx:  Clear, Fair Dentition,    Neck:  FROM, No Cervical Lymphadenopathy nor Thyromegaly or Carotid Bruit; No JVD; Breasts:: Not examined CHEST WALL: No tenderness CHEST: Normal respiration, clear to auscultation bilaterally HEART: Regular rate and rhythm; no murmurs rubs or gallops BACK: No kyphosis or scoliosis; No CVA tenderness ABDOMEN: Positive Bowel Sounds, Obese, Soft Non-Tender; No Masses, No Organomegaly,  No Pannus; No Intertriginous candida. Rectal Exam: Not done EXTREMITIES: No Cyanosis, Clubbing, or Edema; No Ulcerations. Genitalia: not examined PULSES: 2+ and symmetric SKIN: Normal hydration no rash or ulceration CNS: Alert and Oriented x 4, No Focal Deficits.     Vascular: pulses palpable throughout    Labs on Admission:  Basic Metabolic Panel:  Recent Labs Lab 12/09/13 1648  NA 142  K 4.1  CL 104  CO2 25  GLUCOSE 97  BUN 16  CREATININE 1.20  CALCIUM 9.7   Liver Function Tests: No results found for this basename: AST, ALT, ALKPHOS, BILITOT, PROT, ALBUMIN,  in the last 168 hours No results found for this basename: LIPASE, AMYLASE,  in the last 168 hours No results found for this basename: AMMONIA,  in the last 168 hours CBC:  Recent Labs Lab 12/09/13 1648  WBC 11.8*  HGB 15.0  HCT 44.2  MCV 88.2  PLT 201   Cardiac Enzymes:  Recent Labs Lab 12/09/13 1648  TROPONINI <0.30    BNP (last 3 results) No results found for this basename: PROBNP,  in the last 8760 hours CBG: No results found for this basename: GLUCAP,  in the last 168 hours  Radiological Exams on Admission: Ct Angio Chest Pe W/cm &/or Wo Cm  12/09/2013   CLINICAL DATA:  Shortness of breath, chest pain at inspiration  EXAM: CT ANGIOGRAPHY CHEST WITH CONTRAST  TECHNIQUE: Multidetector CT imaging of the chest was performed using the standard protocol during bolus administration of intravenous contrast. Multiplanar CT image reconstructions and MIPs were obtained to evaluate the vascular  anatomy.  CONTRAST:  130mL OMNIPAQUE IOHEXOL 350 MG/ML SOLN  COMPARISON:  Chest radiograph 07/19/2012 most recent exam  FINDINGS: Great vessels are normal in caliber. The examination is adequate for evaluation for acute pulmonary embolism up to and including the 3rd order pulmonary arteries. There are multiple central filling defects within the main and segmental pulmonary arteries bilaterally compatible with moderate volume overall of acute pulmonary embolism. The right ventricular: left ventricular diameter ratio is 1.0, which confers a greater risk of morbidity and mortality reflecting a mild degree of right heart strain.  Heart size is normal. No pericardial effusion. Trace left and small right pleural effusions are identified, with associated compressive atelectasis. An area of more focal wedge-shaped consolidation with internal air bronchogram formation at the right lung base could indicate pulmonary infarct given the presence of multiple pulmonary emboli. Central airways are patent. No lymphadenopathy. No acute osseous abnormality. Central mid thoracic kyphosis is identified without focal compression deformity.  Review of the MIP images confirms the above findings.  IMPRESSION: Multiple diffuse bilateral acute pulmonary emboli with evidence of mild right heart strain.  Right basilar subsegmental atelectasis, infarct, or early pneumonia.  Small right and trace left pleural effusions.  Critical Value/emergent results were called by telephone at the time of interpretation on 12/09/2013 at 6:17 pm to Dr. Alfonzo Beers , who verbally acknowledged these results.   Electronically Signed   By: Conchita Paris M.D.   On: 12/09/2013 18:19    EKG: Independently reviewed.  Normal sinus Rhythm Rate 98 , No Acute S-T  Changes.      Assessment/Plan:   58 y.o. male with   Principal Problem:    1.   Bilateral pulmonary embolism:      Admitted to Stepdown for monitoring   Placed on IV Heparin Drip   Primary Team to  decide on continued Rx coumadin Vs Newer Agents.  Active Problems:    2.   Pleuritic Chest Pain- due to #1.     Pain Control PRN with IV Dilaudid        3.   HYPERLIPIDEMIA   Continue Atorvastatin Rx.        4.   OSA (obstructive sleep apnea)   Continue CPAP qhs.         5.   Greater trochanteric bursitis of right hip   Pain Control PRN  With Oxycodone and IV Dilaudid while Inpatient   Discontinue NSAID Rx while on Anticoaguilants.       Code Status:     FULL CODE Family Communication:    Family at Bedside Disposition Plan:    Inpatient  Time spent:   [place] Pager [phone]   If [place] Please Contact the Day Rounding Team MD for [facility]  If 7PM-7AM, Please Contact Night-Floor Coverage  [email or web address] Password [facility] 12/09/2013, 10:35 PM

## 2013-12-09 NOTE — Patient Instructions (Signed)
Please go directly to ED at [facility]. I have called charge nurse and notified her of your recent presentation. Follow up her as needed post ED evaluation.

## 2013-12-09 NOTE — ED Provider Notes (Signed)
CSN: [id number]     Arrival date & time 12/09/13  1549 History   First MD Initiated Contact with Patient 12/09/13 1601     Chief Complaint  Patient presents with  . Chest Pain     (Consider location/radiation/quality/duration/timing/severity/associated sxs/prior Treatment) HPI Pt presenting with c/o right sided chest pain which began 3 days ago - onset when he was laughing. He has been driving for work a lot- approx 2 weeks ago he had drive to [place].  Denies leg swelling.  No shorntess of breath.  Pain is worse with deep inspiration.  No fever/cough.  He has no hx of DVT/PE, no hx of surgery.  There are no other associated systemic symptoms, there are no other alleviating or modifying factors.   Past Medical History  Diagnosis Date  . Hyperlipidemia   . Increased prostate specific antigen (PSA) velocity     saw Dr Joelyn Oms, s/p Bx(-) 2002  . SCC (squamous cell carcinoma)     Dr Sherrye Payor, in the back  . ED (erectile dysfunction) 07/2008    used some cialis, symptoms not active at present   . Hypertension     was on ACEi-diuretics  . Depression with anxiety   . Atypical chest pain     with ER visit in 8/11. ETT-myoview showed EF 61% no ischemia or infaraction 8/11  . OSA (obstructive sleep apnea) 06/08/2012    HST 05/2012:  AHI 34/hr with desat as low as 69%.   . Prediabetes 09/07/2013   Past Surgical History  Procedure Laterality Date  . Vasectomy    . Knee surgery  2009    left arthroscopy   Family History  Problem Relation Age of Onset  . Colon cancer Neg Hx   . Prostate cancer Neg Hx   . Coronary artery disease Father     stent 59, GF GM  . Heart attack Brother     F B M GM  . Hypertension Father     F B  . Lung cancer Father   . Glaucoma Mother   . Stroke Other     CVA GM  . Stomach cancer Paternal Grandfather   . Gout Brother   . Migraines Sister    History  Substance Use Topics  . Smoking status: Never Smoker   . Smokeless tobacco: Never Used  . Alcohol Use:  No     Comment: occassional--rare    Review of Systems ROS reviewed and all otherwise negative except for mentioned in HPI    Allergies  Review of patient's allergies indicates no known allergies.  Home Medications   Prior to Admission medications   Medication Sig Start Date End Date Taking? Authorizing Provider  atorvastatin (LIPITOR) 40 MG tablet Take 40 mg by mouth daily.   Yes Historical Provider, MD  HYDROcodone-acetaminophen (NORCO/VICODIN) 5-325 MG per tablet Take 1 tablet by mouth every 6 (six) hours as needed for moderate pain.   Yes Historical Provider, MD  sertraline (ZOLOFT) 50 MG tablet Take 50 mg by mouth daily.   Yes Historical Provider, MD  tamsulosin (FLOMAX) 0.4 MG CAPS capsule Take 1 capsule (0.4 mg total) by mouth daily. 09/07/13  Yes Colon Branch, MD  zolpidem (AMBIEN) 10 MG tablet Take 10 mg by mouth at bedtime as needed for sleep.   Yes Historical Provider, MD  Rivaroxaban (XARELTO) 15 MG TABS tablet Take 1 tablet (15 mg total) by mouth 2 (two) times daily with a meal. 12/10/13 12/31/13  Domenic Polite, MD  rivaroxaban (XARELTO) 20 MG TABS tablet Take 1 tablet (20 mg total) by mouth daily with supper. 12/31/13   Domenic Polite, MD   BP 130/92  Pulse 88  Temp(Src) 98 F (36.7 C) (Oral)  Resp 18  Ht 5\' 9"  (1.753 m)  Wt 238 lb 15.7 oz (108.4 kg)  BMI 35.27 kg/m2  SpO2 95% Vitals reviewed Physical Exam Physical Examination: General appearance - alert, well appearing, and in no distress Mental status - alert, oriented to person, place, and time Eyes - no conjunctival injection, no scleral icterus Mouth - mucous membranes moist, pharynx normal without lesions Chest - clear to auscultation, no wheezes, rales or rhonchi, symmetric air entry, no reproducible tenderness to palpation Heart - normal rate, regular rhythm, normal S1, S2, no murmurs, rubs, clicks or gallops Abdomen - soft, nontender, nondistended, no masses or organomegaly Extremities - peripheral pulses  normal, no pedal edema, no clubbing or cyanosis Skin - normal coloration and turgor, no rashes  ED Course  Procedures (including critical care time)  CRITICAL CARE Performed by: Threasa Beards Total critical care time: 35 Critical care time was exclusive of separately billable procedures and treating other patients. Critical care was necessary to treat or prevent imminent or life-threatening deterioration. Critical care was time spent personally by me on the following activities: development of treatment plan with patient and/or surrogate as well as nursing, discussions with consultants, evaluation of patient's response to treatment, examination of patient, obtaining history from patient or surrogate, ordering and performing treatments and interventions, ordering and review of laboratory studies, ordering and review of radiographic studies, pulse oximetry and re-evaluation of patient's condition. Labs Review Labs Reviewed  CBC - Abnormal; Notable for the following:    WBC 11.8 (*)    All other components within normal limits  BASIC METABOLIC PANEL - Abnormal; Notable for the following:    GFR calc non Af Amer 66 (*)    GFR calc Af Amer 76 (*)    All other components within normal limits  CBC - Abnormal; Notable for the following:    WBC 13.8 (*)    All other components within normal limits  BASIC METABOLIC PANEL - Abnormal; Notable for the following:    GFR calc non Af Amer 81 (*)    All other components within normal limits  MRSA PCR SCREENING  TROPONIN I  HEPARIN LEVEL (UNFRACTIONATED)  HEPARIN LEVEL (UNFRACTIONATED)    Imaging Review Ct Angio Chest Pe W/cm &/or Wo Cm  12/09/2013   CLINICAL DATA:  Shortness of breath, chest pain at inspiration  EXAM: CT ANGIOGRAPHY CHEST WITH CONTRAST  TECHNIQUE: Multidetector CT imaging of the chest was performed using the standard protocol during bolus administration of intravenous contrast. Multiplanar CT image reconstructions and MIPs were  obtained to evaluate the vascular anatomy.  CONTRAST:  154mL OMNIPAQUE IOHEXOL 350 MG/ML SOLN  COMPARISON:  Chest radiograph 07/19/2012 most recent exam  FINDINGS: Great vessels are normal in caliber. The examination is adequate for evaluation for acute pulmonary embolism up to and including the 3rd order pulmonary arteries. There are multiple central filling defects within the main and segmental pulmonary arteries bilaterally compatible with moderate volume overall of acute pulmonary embolism. The right ventricular: left ventricular diameter ratio is 1.0, which confers a greater risk of morbidity and mortality reflecting a mild degree of right heart strain.  Heart size is normal. No pericardial effusion. Trace left and small right pleural effusions are identified, with associated compressive atelectasis. An area of more focal  wedge-shaped consolidation with internal air bronchogram formation at the right lung base could indicate pulmonary infarct given the presence of multiple pulmonary emboli. Central airways are patent. No lymphadenopathy. No acute osseous abnormality. Central mid thoracic kyphosis is identified without focal compression deformity.  Review of the MIP images confirms the above findings.  IMPRESSION: Multiple diffuse bilateral acute pulmonary emboli with evidence of mild right heart strain.  Right basilar subsegmental atelectasis, infarct, or early pneumonia.  Small right and trace left pleural effusions.  Critical Value/emergent results were called by telephone at the time of interpretation on 12/09/2013 at 6:17 pm to Dr. Alfonzo Beers , who verbally acknowledged these results.   Electronically Signed   By: Conchita Paris M.D.   On: 12/09/2013 18:19     EKG Interpretation   Date/Time:  Friday December 09 2013 15:55:39 EDT Ventricular Rate:  98 PR Interval:  150 QRS Duration: 90 QT Interval:  344 QTC Calculation: 439 R Axis:   26 Text Interpretation:  Normal sinus rhythm Normal ECG No  significant change  since last tracing Confirmed by Canary Brim  MD, Toshi Ishii [phone]) on 12/09/2013  11:41:23 PM      MDM   1. PE bilateral 2. Chest pain  Pt presenting with right sided pleuritic chest pain.  He has bilateral PE on chest CT.  Pt started on heparin drip and admitted to [facility] to stepdown bed for further management.      Threasa Beards, MD 12/11/13 [phone]

## 2013-12-09 NOTE — Assessment & Plan Note (Addendum)
Rt side chest pain. With recent long trip, sob climbing stairs, and mild positive homans sign as well as pulse increase from 80 to 115 ambulating down hall in our office, I think best to send to [facility]  ED. I called charge nurse and notified them of him coming over. I would expect they will do cxr, lower ext doppler, and  maybe ct of chest.  Dx could range from costonchondritis, muscle strain, walking pneumonia, pneumothorax, dvt, and in worst case scenario PE?)

## 2013-12-09 NOTE — Progress Notes (Signed)
   Subjective:    Patient ID: Oscar Fuller, male    DOB: [birth year], 58 y.o.   MRN: [id number]  HPI  Pt in states pain rt side chest wall on Wednesday morning. He laughed and felt like his breath taken away. When he breaths deep it hurts.(Deeper he breaths the  more pain he has) But can take shallow breaths. Recent fatigue for a last week. [facility] [place] [facility] [place] [facility] [place].  When he sleeps he has pain. When he walks up steps recently he notes getting more sob. Pt had a long trip to [place] where he drove 10 hr down and 10 hrs back. He actually points to rt side thorax as hurting.    Review of Systems  Constitutional: Positive for fatigue. Negative for fever and chills.  HENT: Negative.   Respiratory: Positive for shortness of breath. Negative for cough, chest tightness and wheezing.        Occasoinaly when he goes up steps.  Cardiovascular: Positive for chest pain. Negative for palpitations and leg swelling.       Rt side chest pain.  Musculoskeletal: Negative.        None reported initially but some mild popliteal pain on exam.  Skin: Negative for color change and pallor.  Neurological: Negative.   Psychiatric/Behavioral: Negative.        Objective:   Physical Exam  Constitutional: He is oriented to person, place, and time. He appears well-developed and well-nourished. No distress.  Neck: Normal range of motion. Neck supple. No JVD present. No tracheal deviation present. No thyromegaly present.  Cardiovascular: Normal rate, regular rhythm and normal heart sounds.  Exam reveals no gallop and no friction rub.   No murmur heard. Regular rate at rest. I did walk him down the hall. His o2 sat maintained at 94%. His pulse increased to 115.  Pulmonary/Chest: Effort normal. He has no wheezes. He has no rales.  Even unlabored but breaths shallow. I think rt lung field sounds more shallow than lt side.  Musculoskeletal:  No pedal edema. Calves symmetric. Mild faint postive homans  sign.(Pain reported in popliteal fossae)  Lymphadenopathy:    He has no cervical adenopathy.  Neurological: He is alert and oriented to person, place, and time.  Skin: He is not diaphoretic.  Psychiatric: He has a normal mood and affect. His behavior is normal. Judgment and thought content normal.          Assessment & Plan:

## 2013-12-09 NOTE — Progress Notes (Signed)
ANTICOAGULATION CONSULT NOTE - Initial Consult  Pharmacy Consult for Heparin  Indication: pulmonary embolus  No Known Allergies  Patient Measurements: Height: 5\' 9"  (175.3 cm) Weight: 238 lb 15.7 oz (108.4 kg) IBW/kg (Calculated) : 70.7 Heparin Dosing Weight: 97kg    Vital Signs: Temp: 98.8 F (37.1 C) (07/31 1600) Temp src: Oral (07/31 1600) BP: 139/100 mmHg (07/31 2145) Pulse Rate: 98 (07/31 2145)  Labs:  Recent Labs  12/09/13 1648  HGB 15.0  HCT 44.2  PLT 201  CREATININE 1.20  TROPONINI <0.30    Estimated Creatinine Clearance: 83.4 ml/min (by C-G formula based on Cr of 1.2).   Medical History: Past Medical History  Diagnosis Date  . Hyperlipidemia   . Increased prostate specific antigen (PSA) velocity     saw Dr Joelyn Oms, s/p Bx(-) 2002  . SCC (squamous cell carcinoma)     Dr Sherrye Payor, in the back  . ED (erectile dysfunction) 07/2008    used some cialis, symptoms not active at present   . Hypertension     was on ACEi-diuretics  . Depression with anxiety   . Atypical chest pain     with ER visit in 8/11. ETT-myoview showed EF 61% no ischemia or infaraction 8/11  . OSA (obstructive sleep apnea) 06/08/2012    HST 05/2012:  AHI 34/hr with desat as low as 69%.   . Prediabetes 09/07/2013    Assessment: 58yom with right sided chest pain and SOB.  He had recent long car ride.  CT scan shows B/L PE.  Heparin bolus 4000 utsiv x 1 was given and drip started at 1000 uts/hr at [place].  Will increase drip rate up slightly.  CBC stable.   Goal of Therapy:  Heparin level 0.3-0.7 units/ml Monitor platelets by anticoagulation protocol: Yes   Plan:  Increase Heparin drip rate 16ut/kg = 1550 uts/hr Draw heparin level 6hr after start and daily Daily cbc  Bonnita Nasuti Pharm.D. CPP, BCPS Clinical Pharmacist [phone] 12/09/2013 9:58 PM

## 2013-12-09 NOTE — ED Notes (Signed)
Pt sent from PCP to r/o PE.  Pt reports laughing 3 days ago and having sudden onset of (R) sided CP.  Reports increased pain with deep inspiration.  Took a long car ride to [place] and back last week.

## 2013-12-10 ENCOUNTER — Encounter (HOSPITAL_COMMUNITY): Payer: Self-pay | Admitting: General Practice

## 2013-12-10 DIAGNOSIS — I2699 Other pulmonary embolism without acute cor pulmonale: Secondary | ICD-10-CM

## 2013-12-10 DIAGNOSIS — F341 Dysthymic disorder: Secondary | ICD-10-CM

## 2013-12-10 DIAGNOSIS — R071 Chest pain on breathing: Secondary | ICD-10-CM

## 2013-12-10 LAB — BASIC METABOLIC PANEL
Anion gap: 14 (ref 5–15)
BUN: 14 mg/dL (ref 6–23)
CALCIUM: 9 mg/dL (ref 8.4–10.5)
CO2: 23 mEq/L (ref 19–32)
Chloride: 103 mEq/L (ref 96–112)
Creatinine, Ser: 1.01 mg/dL (ref 0.50–1.35)
GFR calc Af Amer: >90 mL/min (ref >90)
GFR, EST NON AFRICAN AMERICAN: 81 mL/min — AB (ref >90)
GLUCOSE: 96 mg/dL (ref 70–99)
POTASSIUM: 4.1 meq/L (ref 3.7–5.3)
Sodium: 140 mEq/L (ref 137–147)

## 2013-12-10 LAB — CBC
HEMATOCRIT: 42.2 % (ref 39.0–52.0)
HEMOGLOBIN: 14.3 g/dL (ref 13.0–17.0)
MCH: 29.8 pg (ref 26.0–34.0)
MCHC: 33.9 g/dL (ref 30.0–36.0)
MCV: 87.9 fL (ref 78.0–100.0)
Platelets: 195 10*3/uL (ref 150–400)
RBC: 4.8 MIL/uL (ref 4.22–5.81)
RDW: 13.2 % (ref 11.5–15.5)
WBC: 13.8 10*3/uL — ABNORMAL HIGH (ref 4.0–10.5)

## 2013-12-10 LAB — HEPARIN LEVEL (UNFRACTIONATED)
HEPARIN UNFRACTIONATED: 0.38 [IU]/mL (ref 0.30–0.70)
HEPARIN UNFRACTIONATED: 0.43 [IU]/mL (ref 0.30–0.70)

## 2013-12-10 MED ORDER — RIVAROXABAN 15 MG PO TABS
15.0000 mg | ORAL_TABLET | Freq: Two times a day (BID) | ORAL | Status: DC
  Administered 2013-12-10 – 2013-12-11 (×3): 15 mg via ORAL
  Filled 2013-12-10 (×5): qty 1

## 2013-12-10 MED ORDER — RIVAROXABAN (XARELTO) EDUCATION KIT FOR DVT/PE PATIENTS
PACK | Freq: Once | Status: AC
  Administered 2013-12-10: 12:00:00
  Filled 2013-12-10 (×2): qty 1

## 2013-12-10 MED ORDER — RIVAROXABAN 20 MG PO TABS: 20.0000 mg | ORAL_TABLET | Freq: Every day | ORAL | Status: DC

## 2013-12-10 NOTE — Progress Notes (Signed)
VASCULAR LAB PRELIMINARY  PRELIMINARY  PRELIMINARY  PRELIMINARY  Bilateral lower extremity venous Dopplers completed.    Preliminary report:  There is acute DVT noted in the left posterior tibial vein.  All other veins appear thrombus free.  Duel Conrad, RVT 12/10/2013, 11:20 AM

## 2013-12-10 NOTE — Discharge Instructions (Signed)
Information on my medicine - XARELTO (rivaroxaban)  This medication education was reviewed with me or my healthcare representative as part of my discharge preparation.  The pharmacist that spoke with me during my hospital stay was:  Eudelia Bunch, [place]? Xarelto was prescribed to treat blood clots that may have been found in the veins of your legs (deep vein thrombosis) or in your lungs (pulmonary embolism) and to reduce the risk of them occurring again.  What do you need to know about Xarelto? The starting dose is one 15 mg tablet taken TWICE daily with food for the FIRST 21 DAYS then on Saturday 8/22  the dose is changed to one 20 mg tablet taken ONCE A DAY with your evening meal.  DO NOT stop taking Xarelto without talking to the health care provider who prescribed the medication.  Refill your prescription for 20 mg tablets before you run out.  After discharge, you should have regular check-up appointments with your healthcare provider that is prescribing your Xarelto.  In the future your dose may need to be changed if your kidney function changes by a significant amount.  What do you do if you miss a dose? If you are taking Xarelto TWICE DAILY and you miss a dose, take it as soon as you remember. You may take two 15 mg tablets (total 30 mg) at the same time then resume your regularly scheduled 15 mg twice daily the next day.  If you are taking Xarelto ONCE DAILY and you miss a dose, take it as soon as you remember on the same day then continue your regularly scheduled once daily regimen the next day. Do not take two doses of Xarelto at the same time.   Important Safety Information Xarelto is a blood thinner medicine that can cause bleeding. You should call your healthcare provider right away if you experience any of the following:   Bleeding from an injury or your nose that does not stop.   Unusual colored urine (red or dark brown) or unusual  colored stools (red or black).   Unusual bruising for unknown reasons.   A serious fall or if you hit your head (even if there is no bleeding).  Some medicines may interact with Xarelto and might increase your risk of bleeding while on Xarelto. To help avoid this, consult your healthcare provider or pharmacist prior to using any new prescription or non-prescription medications, including herbals, vitamins, non-steroidal anti-inflammatory drugs (NSAIDs) and supplements.  This website has more information on Xarelto: [email or web address] on my medicine - XARELTO (rivaroxaban)

## 2013-12-10 NOTE — Progress Notes (Addendum)
ANTICOAGULATION CONSULT NOTE - Follow Up Consult  Pharmacy Consult for convert UFH to Xarelto Indication: pulmonary embolus  No Known Allergies  Patient Measurements: Height: _0  (175.3 cm) Weight: 238 lb 15.7 oz (108.4 kg) IBW/kg (Calculated) : 70.7 0  Vital Signs: Temp: 98 F (36.7 C) (08/01 0733) Temp src: Oral (08/01 0733) BP: 126/79 mmHg (08/01 0733) Pulse Rate: 81 (08/01 0733)  Labs:  Recent Labs  12/09/13 1648 12/10/13 0200 12/10/13 0746  HGB 15.0 14.3  --   HCT 44.2 42.2  --   PLT 201 195  --   HEPARINUNFRC  --  0.38 0.43  CREATININE 1.20 1.01  --   TROPONINI <0.30  --   --     Estimated Creatinine Clearance: 99.1 ml/min (by C-G formula based on Cr of 1.01).    Assessment: Pharmacy consulted to convert UFH to Xarelto for PE.  Wt 108 kg,  Currently therapeutic on UFH at 1550 units/hr.  Creat 0.43.  CBC stable, no bleeding reported.  Plan:  Stop heparin drip at time of first xarelto dose xarelto 15 mg po bid x 21 days Then on Saturday August 22, start xarelto 20 mg daily with supper Xarelto discharge kit ordered for pt (will contain a 30 day free card) Education completed with pt  Eudelia Bunch, Pharm.D. [phone] 12/10/2013 8:29 AM

## 2013-12-10 NOTE — Progress Notes (Signed)
ANTICOAGULATION CONSULT NOTE - Follow Up Consult  Pharmacy Consult for heparin Indication: pulmonary embolus  Labs:  Recent Labs  12/09/13 1648 12/10/13 0200  HGB 15.0 14.3  HCT 44.2 42.2  PLT 201 195  HEPARINUNFRC  --  0.38  CREATININE 1.20  --   TROPONINI <0.30  --     Assessment/Plan:  58yo male therapeutic on heparin with initial dosing for PE. Will continue gtt at current rate and confirm stable with additional level.   Wynona Neat, PharmD, BCPS  12/10/2013,2:25 AM

## 2013-12-10 NOTE — Progress Notes (Signed)
[facility] PROGRESS NOTE  Oscar Fuller VOJ:[id number] DOB: 02-28-1956 DOA: 12/09/2013 PCP: [name], MD  Assessment/Plan: 1. Acute PE -hemodynamics stable,  -Risk factor-recent long drive to [place] and back -discussed AC options, transition to Xarelto, needs 3-1mths -check dopplers  2. HTN -stable  3. R hip pain -being worked up per Ortho  4. OSA -CPAP  Code Status: Full Code Family Communication: none at bedside Disposition Plan: home tomorrow    HPI/Subjective:  Objective: Filed Vitals:   12/10/13 0733  BP: 126/79  Pulse: 81  Temp: 98 F (36.7 C)  Resp: 18    Intake/Output Summary (Last 24 hours) at 12/10/13 0754 Last data filed at 12/10/13 0700  Gross per 24 hour  Intake  152.5 ml  Output    500 ml  Net -347.5 ml   Filed Weights   12/09/13 1600 12/09/13 2120  Weight: 108.863 kg (240 lb) 108.4 kg (238 lb 15.7 oz)    Exam:   General:  AAOx3  Cardiovascular: S1S2/RRR  Respiratory: CTAB  Abdomen: soft, NT, BS present  Musculoskeletal: no edema c/c   Data Reviewed: Basic Metabolic Panel:  Recent Labs Lab 12/09/13 1648 12/10/13 0200  NA 142 140  K 4.1 4.1  CL 104 103  CO2 25 23  GLUCOSE 97 96  BUN 16 14  CREATININE 1.20 1.01  CALCIUM 9.7 9.0   Liver Function Tests: No results found for this basename: AST, ALT, ALKPHOS, BILITOT, PROT, ALBUMIN,  in the last 168 hours No results found for this basename: LIPASE, AMYLASE,  in the last 168 hours No results found for this basename: AMMONIA,  in the last 168 hours CBC:  Recent Labs Lab 12/09/13 1648 12/10/13 0200  WBC 11.8* 13.8*  HGB 15.0 14.3  HCT 44.2 42.2  MCV 88.2 87.9  PLT 201 195   Cardiac Enzymes:  Recent Labs Lab 12/09/13 1648  TROPONINI <0.30   BNP (last 3 results) No results found for this basename: PROBNP,  in the last 8760 hours CBG: No results found for this basename: GLUCAP,  in the last 168 hours  Recent Results (from the past 240 hour(s))  MRSA PCR  SCREENING     Status: None   Collection Time    12/09/13  9:19 PM      Result Value Ref Range Status   MRSA by PCR NEGATIVE  NEGATIVE Final   Comment:            The GeneXpert MRSA Assay (FDA     approved for NASAL specimens     only), is one component of a     comprehensive MRSA colonization     surveillance program. It is not     intended to diagnose MRSA     infection nor to guide or     monitor treatment for     MRSA infections.     Studies: Ct Angio Chest Pe W/cm &/or Wo Cm  12/09/2013   CLINICAL DATA:  Shortness of breath, chest pain at inspiration  EXAM: CT ANGIOGRAPHY CHEST WITH CONTRAST  TECHNIQUE: Multidetector CT imaging of the chest was performed using the standard protocol during bolus administration of intravenous contrast. Multiplanar CT image reconstructions and MIPs were obtained to evaluate the vascular anatomy.  CONTRAST:  144mL OMNIPAQUE IOHEXOL 350 MG/ML SOLN  COMPARISON:  Chest radiograph 07/19/2012 most recent exam  FINDINGS: Great vessels are normal in caliber. The examination is adequate for evaluation for acute pulmonary embolism up to and including the  3rd order pulmonary arteries. There are multiple central filling defects within the main and segmental pulmonary arteries bilaterally compatible with moderate volume overall of acute pulmonary embolism. The right ventricular: left ventricular diameter ratio is 1.0, which confers a greater risk of morbidity and mortality reflecting a mild degree of right heart strain.  Heart size is normal. No pericardial effusion. Trace left and small right pleural effusions are identified, with associated compressive atelectasis. An area of more focal wedge-shaped consolidation with internal air bronchogram formation at the right lung base could indicate pulmonary infarct given the presence of multiple pulmonary emboli. Central airways are patent. No lymphadenopathy. No acute osseous abnormality. Central mid thoracic kyphosis is  identified without focal compression deformity.  Review of the MIP images confirms the above findings.  IMPRESSION: Multiple diffuse bilateral acute pulmonary emboli with evidence of mild right heart strain.  Right basilar subsegmental atelectasis, infarct, or early pneumonia.  Small right and trace left pleural effusions.  Critical Value/emergent results were called by telephone at the time of interpretation on 12/09/2013 at 6:17 pm to Dr. Alfonzo Beers , who verbally acknowledged these results.   Electronically Signed   By: Conchita Paris M.D.   On: 12/09/2013 18:19    Scheduled Meds: . aspirin EC  81 mg Oral Daily  . atorvastatin  40 mg Oral q1800  . fluticasone  2 spray Each Nare Daily  . sertraline  50 mg Oral Daily  . sodium chloride  3 mL Intravenous Q12H  . tamsulosin  0.4 mg Oral Daily   Continuous Infusions: . heparin 1,550 Units/hr (12/09/13 2145)   Antibiotics Given (last 72 hours)   None      Principal Problem:   Bilateral pulmonary embolism Active Problems:   HYPERLIPIDEMIA   OSA (obstructive sleep apnea)   Greater trochanteric bursitis of right hip   Pulmonary embolism    Time spent:8min    [facility]  [facility] Pager [phone]. If 7PM-7AM, please contact night-coverage at [email or web address], password [facility] 12/10/2013, 7:54 AM  LOS: 1 day

## 2013-12-11 DIAGNOSIS — I82409 Acute embolism and thrombosis of unspecified deep veins of unspecified lower extremity: Secondary | ICD-10-CM | POA: Diagnosis present

## 2013-12-11 MED ORDER — RIVAROXABAN 20 MG PO TABS: 20.0000 mg | ORAL_TABLET | Freq: Every day | ORAL | Status: DC

## 2013-12-11 MED ORDER — RIVAROXABAN 15 MG PO TABS: 15.0000 mg | ORAL_TABLET | Freq: Two times a day (BID) | ORAL | Status: DC

## 2013-12-11 NOTE — Discharge Summary (Signed)
Physician Discharge Summary  Oscar Fuller WJX:[id number] DOB: 10-17-1955 DOA: 12/09/2013  PCP: [name], MD  Admit date: 12/09/2013 Discharge date: 12/11/2013  Time spent: 35 minutes  Recommendations for Outpatient Follow-up:  1. PCP in 1 week  Discharge Diagnoses:  Principal Problem:   Bilateral pulmonary embolism Active Problems:   HYPERLIPIDEMIA   OSA (obstructive sleep apnea)   Prediabetes   Greater trochanteric bursitis of right hip   DVT (deep venous thrombosis)   Discharge Condition: stable  Diet recommendation: heart healthy  Filed Weights   12/09/13 1600 12/09/13 2120  Weight: 108.863 kg (240 lb) 108.4 kg (238 lb 15.7 oz)    History of present illness:  Oscar Fuller is a 58 y.o. male with a history of hyperlipidemia who was seen by his PCP and referred to the ED due to continued intermittent right sided chest pain since Wednesday. He described having 10/10 pain on Wednesday, and the pain was worse with a deep breath.  He was evaluated in the ED and [facility] and found to have Bilateral Pulmonary Emboli with mild right heart strain on CTA of the chest.  He reported driving to [place] a week ago. No family history of clotting disorders. He also has had severe pain in his right hip for several months and is seeing Orthopedics   Hospital Course:  1. Acute PE -hemodynamics stable,  -Risk factor-recent long drive to [place] and back  -discussed Anticoagulation options, ans transitioned to Xarelto, needs at least 3-2mths of anticoagulation -Venous dopplers with small LLE DVT -discussed bleeding risk associated with this and recommended stoppng ASA  2. HTN  -stable   3. R hip pain  -being worked up per Ortho  -NO NSAIDs, hydrocodone PRN  4. OSA  -CPAP   Discharge Exam: Filed Vitals:   12/11/13 0435  BP: 130/92  Pulse: 88  Temp: 98 F (36.7 C)  Resp: 18    General: AAOx3 Cardiovascular: S1S2/RRR Respiratory: CTAB  Discharge Instructions You were cared for by  a hospitalist during your hospital stay. If you have any questions about your discharge medications or the care you received while you were in the hospital after you are discharged, you can call the unit and asked to speak with the hospitalist on call if the hospitalist that took care of you is not available. Once you are discharged, your primary care physician will handle any further medical issues. Please note that NO REFILLS for any discharge medications will be authorized once you are discharged, as it is imperative that you return to your primary care physician (or establish a relationship with a primary care physician if you do not have one) for your aftercare needs so that they can reassess your need for medications and monitor your lab values.  Discharge Instructions   Diet - low sodium heart healthy    Complete by:  As directed      Increase activity slowly    Complete by:  As directed             Medication List    STOP taking these medications       aspirin 81 MG tablet      TAKE these medications       atorvastatin 40 MG tablet  Commonly known as:  LIPITOR  Take 40 mg by mouth daily.     HYDROcodone-acetaminophen 5-325 MG per tablet  Commonly known as:  NORCO/VICODIN  Take 1 tablet by mouth every 6 (six) hours as needed for  moderate pain.     Rivaroxaban 15 MG Tabs tablet  Commonly known as:  XARELTO  Take 1 tablet (15 mg total) by mouth 2 (two) times daily with a meal.     rivaroxaban 20 MG Tabs tablet  Commonly known as:  XARELTO  Take 1 tablet (20 mg total) by mouth daily with supper.  Start taking on:  12/31/2013     sertraline 50 MG tablet  Commonly known as:  ZOLOFT  Take 50 mg by mouth daily.     tamsulosin 0.4 MG Caps capsule  Commonly known as:  FLOMAX  Take 1 capsule (0.4 mg total) by mouth daily.     zolpidem 10 MG tablet  Commonly known as:  AMBIEN  Take 10 mg by mouth at bedtime as needed for sleep.       No Known Allergies     Follow-up  Information   Follow up with [name], MD. Schedule an appointment as soon as possible for a visit in 1 week.   Specialty:  Internal Medicine   Contact information:   [phone] [address] [facility] [address] [place] [address] [phone]        The results of significant diagnostics from this hospitalization (including imaging, microbiology, ancillary and laboratory) are listed below for reference.    Significant Diagnostic Studies: Ct Angio Chest Pe W/cm &/or Wo Cm  12/09/2013   CLINICAL DATA:  Shortness of breath, chest pain at inspiration  EXAM: CT ANGIOGRAPHY CHEST WITH CONTRAST  TECHNIQUE: Multidetector CT imaging of the chest was performed using the standard protocol during bolus administration of intravenous contrast. Multiplanar CT image reconstructions and MIPs were obtained to evaluate the vascular anatomy.  CONTRAST:  165mL OMNIPAQUE IOHEXOL 350 MG/ML SOLN  COMPARISON:  Chest radiograph 07/19/2012 most recent exam  FINDINGS: Great vessels are normal in caliber. The examination is adequate for evaluation for acute pulmonary embolism up to and including the 3rd order pulmonary arteries. There are multiple central filling defects within the main and segmental pulmonary arteries bilaterally compatible with moderate volume overall of acute pulmonary embolism. The right ventricular: left ventricular diameter ratio is 1.0, which confers a greater risk of morbidity and mortality reflecting a mild degree of right heart strain.  Heart size is normal. No pericardial effusion. Trace left and small right pleural effusions are identified, with associated compressive atelectasis. An area of more focal wedge-shaped consolidation with internal air bronchogram formation at the right lung base could indicate pulmonary infarct given the presence of multiple pulmonary emboli. Central airways are patent. No lymphadenopathy. No acute osseous abnormality. Central mid thoracic kyphosis is identified without  focal compression deformity.  Review of the MIP images confirms the above findings.  IMPRESSION: Multiple diffuse bilateral acute pulmonary emboli with evidence of mild right heart strain.  Right basilar subsegmental atelectasis, infarct, or early pneumonia.  Small right and trace left pleural effusions.  Critical Value/emergent results were called by telephone at the time of interpretation on 12/09/2013 at 6:17 pm to Dr. Alfonzo Beers , who verbally acknowledged these results.   Electronically Signed   By: Conchita Paris M.D.   On: 12/09/2013 18:19    Microbiology: Recent Results (from the past 240 hour(s))  MRSA PCR SCREENING     Status: None   Collection Time    12/09/13  9:19 PM      Result Value Ref Range Status   MRSA by PCR NEGATIVE  NEGATIVE Final   Comment:  The GeneXpert MRSA Assay (FDA     approved for NASAL specimens     only), is one component of a     comprehensive MRSA colonization     surveillance program. It is not     intended to diagnose MRSA     infection nor to guide or     monitor treatment for     MRSA infections.     Labs: Basic Metabolic Panel:  Recent Labs Lab 12/09/13 1648 12/10/13 0200  NA 142 140  K 4.1 4.1  CL 104 103  CO2 25 23  GLUCOSE 97 96  BUN 16 14  CREATININE 1.20 1.01  CALCIUM 9.7 9.0   Liver Function Tests: No results found for this basename: AST, ALT, ALKPHOS, BILITOT, PROT, ALBUMIN,  in the last 168 hours No results found for this basename: LIPASE, AMYLASE,  in the last 168 hours No results found for this basename: AMMONIA,  in the last 168 hours CBC:  Recent Labs Lab 12/09/13 1648 12/10/13 0200  WBC 11.8* 13.8*  HGB 15.0 14.3  HCT 44.2 42.2  MCV 88.2 87.9  PLT 201 195   Cardiac Enzymes:  Recent Labs Lab 12/09/13 1648  TROPONINI <0.30   BNP: BNP (last 3 results) No results found for this basename: PROBNP,  in the last 8760 hours CBG: No results found for this basename: GLUCAP,  in the last 168  hours     Signed:  Devin Foskey  [facility] 12/11/2013, 9:34 AM

## 2013-12-11 NOTE — Progress Notes (Signed)
Patient refuses CPAP therapy at this time.  Patient states that if he has to stay in the hospital tomorrow night he will have his family bring his machine from home.

## 2013-12-12 ENCOUNTER — Emergency Department (HOSPITAL_COMMUNITY): Payer: BC Managed Care – PPO

## 2013-12-12 ENCOUNTER — Encounter (HOSPITAL_COMMUNITY): Payer: Self-pay | Admitting: Emergency Medicine

## 2013-12-12 ENCOUNTER — Emergency Department (HOSPITAL_COMMUNITY)
Admission: EM | Admit: 2013-12-12 | Discharge: 2013-12-12 | Disposition: A | Discharge status: Home / Self Care | Payer: BC Managed Care – PPO | Attending: Emergency Medicine | Admitting: Emergency Medicine

## 2013-12-12 DIAGNOSIS — E785 Hyperlipidemia, unspecified: Secondary | ICD-10-CM | POA: Insufficient documentation

## 2013-12-12 DIAGNOSIS — Z8669 Personal history of other diseases of the nervous system and sense organs: Secondary | ICD-10-CM | POA: Insufficient documentation

## 2013-12-12 DIAGNOSIS — R071 Chest pain on breathing: Secondary | ICD-10-CM | POA: Insufficient documentation

## 2013-12-12 DIAGNOSIS — I2699 Other pulmonary embolism without acute cor pulmonale: Secondary | ICD-10-CM

## 2013-12-12 DIAGNOSIS — Z79899 Other long term (current) drug therapy: Secondary | ICD-10-CM | POA: Insufficient documentation

## 2013-12-12 DIAGNOSIS — Z85828 Personal history of other malignant neoplasm of skin: Secondary | ICD-10-CM | POA: Insufficient documentation

## 2013-12-12 DIAGNOSIS — F341 Dysthymic disorder: Secondary | ICD-10-CM | POA: Insufficient documentation

## 2013-12-12 DIAGNOSIS — I10 Essential (primary) hypertension: Secondary | ICD-10-CM | POA: Insufficient documentation

## 2013-12-12 DIAGNOSIS — R079 Chest pain, unspecified: Secondary | ICD-10-CM | POA: Insufficient documentation

## 2013-12-12 DIAGNOSIS — N529 Male erectile dysfunction, unspecified: Secondary | ICD-10-CM | POA: Insufficient documentation

## 2013-12-12 DIAGNOSIS — Z7901 Long term (current) use of anticoagulants: Secondary | ICD-10-CM | POA: Insufficient documentation

## 2013-12-12 LAB — CBC
HEMATOCRIT: 42.2 % (ref 39.0–52.0)
HEMOGLOBIN: 14.6 g/dL (ref 13.0–17.0)
MCH: 30 pg (ref 26.0–34.0)
MCHC: 34.6 g/dL (ref 30.0–36.0)
MCV: 86.8 fL (ref 78.0–100.0)
Platelets: 221 10*3/uL (ref 150–400)
RBC: 4.86 MIL/uL (ref 4.22–5.81)
RDW: 13 % (ref 11.5–15.5)
WBC: 11.2 10*3/uL — ABNORMAL HIGH (ref 4.0–10.5)

## 2013-12-12 LAB — BASIC METABOLIC PANEL
ANION GAP: 12 (ref 5–15)
BUN: 15 mg/dL (ref 6–23)
CALCIUM: 9.1 mg/dL (ref 8.4–10.5)
CHLORIDE: 105 meq/L (ref 96–112)
CO2: 24 mEq/L (ref 19–32)
CREATININE: 0.99 mg/dL (ref 0.50–1.35)
GFR calc Af Amer: >90 mL/min (ref >90)
GFR calc non Af Amer: 90 mL/min — ABNORMAL LOW (ref >90)
Glucose, Bld: 111 mg/dL — ABNORMAL HIGH (ref 70–99)
Potassium: 3.8 mEq/L (ref 3.7–5.3)
Sodium: 141 mEq/L (ref 137–147)

## 2013-12-12 LAB — I-STAT TROPONIN, ED: TROPONIN I, POC: 0.01 ng/mL (ref 0.00–0.08)

## 2013-12-12 MED ORDER — HYDROMORPHONE HCL PF 1 MG/ML IJ SOLN
1.0000 mg | Freq: Once | INTRAMUSCULAR | Status: AC
Start: 2013-12-12 — End: 2013-12-12
  Administered 2013-12-12: 1 mg via INTRAVENOUS
  Filled 2013-12-12: qty 1

## 2013-12-12 MED ORDER — ONDANSETRON HCL 4 MG/2ML IJ SOLN
4.0000 mg | Freq: Once | INTRAMUSCULAR | Status: AC
  Administered 2013-12-12: 4 mg via INTRAVENOUS
  Filled 2013-12-12: qty 2

## 2013-12-12 MED ORDER — OXYCODONE-ACETAMINOPHEN 5-325 MG PO TABS
1.0000 | ORAL_TABLET | Freq: Four times a day (QID) | ORAL | Status: DC | PRN
Start: 2013-12-12 — End: 2014-02-14

## 2013-12-12 NOTE — ED Notes (Signed)
Pt states he feels like he has a catch in his chest that takes his breath away.  Left leg feels tight.  Just D/Cd from hospital for PE. And multiple DVT.

## 2013-12-12 NOTE — ED Provider Notes (Signed)
I saw and evaluated the patient, reviewed the resident's note and I agree with the findings and plan.   Date: 12/12/2013  Rate: 75  Rhythm: normal sinus rhythm  QRS Axis: normal  Intervals: normal  ST/T Wave abnormalities: normal  Conduction Disutrbances:none  Narrative Interpretation:   Old EKG Reviewed: unchanged   Pt with recent dx bil PE w right lower lung infarct/effusion, c/o persistent, pleuritic pain right lower chest. States it is the same pain he had when just admitted/discharged w dx PE.  Denies any new/new location of chest pain, denies worsening sob.  No increased cough or fever/chills.  Chest cta. Afeb. Cxr. Pain rx. Feel pain c/w recent changes noted on ct, specifically right lower lung effusion/?infarct.  rr 16, pulse ox 95% room air, hr 72, no increased wob.   Mirna Mires, MD 12/12/13 1048

## 2013-12-12 NOTE — ED Provider Notes (Signed)
CSN: [id number]     Arrival date & time 12/12/13  1607 History   First MD Initiated Contact with Patient 12/12/13 [id number]     Chief Complaint  Patient presents with  . Chest Pain    Hx of PE  . Shortness of Breath   Oscar Fuller is a 58 year old man discharged yesterday (8/2) for b/l PE on xarelto with LLE DVT, HTN, and HL brought this morning by EMS for R sided chest pain. He says the pain is the same as when he b/l PE presented on 7/31. It is on his R lateral chest, non-radiating, and worse with inspiration. It lasts up to 15-20 minutes and goes away on its own (nothing makes it better).   (Consider location/radiation/quality/duration/timing/severity/associated sxs/prior Treatment) Patient is a 58 y.o. male presenting with chest pain and shortness of breath. The history is provided by the patient and a relative.  Chest Pain Pain location:  R lateral chest Pain quality: sharp and stabbing   Pain quality: no pressure, not radiating and no tightness   Pain radiates to:  Does not radiate Pain radiates to the back: no   Pain severity:  Mild Onset quality:  Sudden Duration:  20 minutes Timing:  Intermittent Progression:  Waxing and waning Context: breathing and movement   Relieved by:  Nothing Worsened by:  Deep breathing Associated symptoms: shortness of breath   Associated symptoms: no abdominal pain, no diaphoresis, no dizziness, no fever, no headache, no nausea, no numbness, no palpitations and not vomiting   Shortness of Breath Associated symptoms: chest pain   Associated symptoms: no abdominal pain, no diaphoresis, no fever, no headaches and no vomiting     Past Medical History  Diagnosis Date  . Hyperlipidemia   . Increased prostate specific antigen (PSA) velocity     saw Dr Joelyn Oms, s/p Bx(-) 2002  . SCC (squamous cell carcinoma)     Dr Sherrye Payor, in the back  . ED (erectile dysfunction) 07/2008    used some cialis, symptoms not active at present   . Hypertension     was on  ACEi-diuretics  . Depression with anxiety   . Atypical chest pain     with ER visit in 8/11. ETT-myoview showed EF 61% no ischemia or infaraction 8/11  . OSA (obstructive sleep apnea) 06/08/2012    HST 05/2012:  AHI 34/hr with desat as low as 69%.   . Prediabetes 09/07/2013   Past Surgical History  Procedure Laterality Date  . Vasectomy    . Knee surgery  2009    left arthroscopy   Family History  Problem Relation Age of Onset  . Colon cancer Neg Hx   . Prostate cancer Neg Hx   . Coronary artery disease Father     stent 41, GF GM  . Heart attack Brother     F B M GM  . Hypertension Father     F B  . Lung cancer Father   . Glaucoma Mother   . Stroke Other     CVA GM  . Stomach cancer Paternal Grandfather   . Gout Brother   . Migraines Sister    History  Substance Use Topics  . Smoking status: Never Smoker   . Smokeless tobacco: Never Used  . Alcohol Use: No     Comment: occassional--rare    Review of Systems  Constitutional: Positive for chills. Negative for fever and diaphoresis.  Respiratory: Positive for shortness of breath. Negative for chest tightness.  Cardiovascular: Positive for chest pain. Negative for palpitations and leg swelling.  Gastrointestinal: Negative for nausea, vomiting and abdominal pain.  Genitourinary: Negative for flank pain.  Skin: Positive for color change and pallor.  Neurological: Negative for dizziness, light-headedness, numbness and headaches.      Allergies  Review of patient's allergies indicates no known allergies.  Home Medications   Prior to Admission medications   Medication Sig Start Date End Date Taking? Authorizing Provider  atorvastatin (LIPITOR) 40 MG tablet Take 40 mg by mouth daily.   Yes Historical Provider, MD  cyclobenzaprine (FLEXERIL) 5 MG tablet Take 5 mg by mouth 3 (three) times daily as needed for muscle spasms.   Yes Historical Provider, MD  etodolac (LODINE) 500 MG tablet Take 500 mg by mouth 2 (two) times  daily as needed (for pain).   Yes Historical Provider, MD  HYDROcodone-acetaminophen (NORCO/VICODIN) 5-325 MG per tablet Take 1 tablet by mouth every 6 (six) hours as needed for moderate pain.   Yes Historical Provider, MD  Rivaroxaban (XARELTO) 15 MG TABS tablet Take 1 tablet (15 mg total) by mouth 2 (two) times daily with a meal. 12/10/13 12/31/13 Yes Domenic Polite, MD  sertraline (ZOLOFT) 50 MG tablet Take 50 mg by mouth daily.   Yes Historical Provider, MD  tamsulosin (FLOMAX) 0.4 MG CAPS capsule Take 1 capsule (0.4 mg total) by mouth daily. 09/07/13  Yes Colon Branch, MD  zolpidem (AMBIEN) 10 MG tablet Take 10 mg by mouth at bedtime as needed for sleep.   Yes Historical Provider, MD  oxyCODONE-acetaminophen (PERCOCET/ROXICET) 5-325 MG per tablet Take 1-2 tablets by mouth every 6 (six) hours as needed for severe pain. 12/12/13   Kelby Aline, MD  rivaroxaban (XARELTO) 20 MG TABS tablet Take 1 tablet (20 mg total) by mouth daily with supper. 12/31/13   Domenic Polite, MD   BP 117/75  Pulse 75  Temp(Src) 97.5 F (36.4 C)  Resp 17  SpO2 97% Physical Exam  Constitutional: He is oriented to person, place, and time. He appears well-developed and well-nourished. He appears distressed.  HENT:  Mouth/Throat: Oropharynx is clear and moist.  Eyes: EOM are normal. Pupils are equal, round, and reactive to light. No scleral icterus.  Cardiovascular: Normal rate, regular rhythm, normal heart sounds and intact distal pulses.  Exam reveals no gallop and no friction rub.   No murmur heard. Pulmonary/Chest: Breath sounds normal. He has no wheezes. He has no rales. He exhibits no tenderness.  Abdominal: Soft. Bowel sounds are normal. He exhibits no distension. There is no tenderness. There is no rebound and no guarding.  Neurological: He is alert and oriented to person, place, and time. No cranial nerve deficit.  Skin: He is not diaphoretic.    ED Course  Procedures (including critical care time) Labs  Review Labs Reviewed  BASIC METABOLIC PANEL - Abnormal; Notable for the following:    Glucose, Bld 111 (*)    GFR calc non Af Amer 90 (*)    All other components within normal limits  CBC - Abnormal; Notable for the following:    WBC 11.2 (*)    All other components within normal limits  I-STAT TROPOININ, ED   Basic Metabolic Panel:  Recent Labs  12/10/13 0200 12/12/13 0902  NA 140 141  K 4.1 3.8  CL 103 105  CO2 23 24  GLUCOSE 96 111*  BUN 14 15  CREATININE 1.01 0.99  CALCIUM 9.0 9.1   CBC:  Recent Labs  12/10/13 0200 12/12/13 0902  WBC 13.8* 11.2*  HGB 14.3 14.6  HCT 42.2 42.2  MCV 87.9 86.8  PLT 195 221   Cardiac Enzymes: I-stat troponin 0.01  Imaging Review Dg Chest 2 View  12/12/2013   CLINICAL DATA:  Chest pain.  Shortness of breath.  EXAM: CHEST  2 VIEW  COMPARISON:  CT 12/09/2013.  FINDINGS: Mediastinum and hilar structures normal. Subsegmental atelectasis right lung base. No pleural effusion or pneumothorax. Cardiomegaly. No acute bony abnormality .  IMPRESSION: Subsegmental atelectasis right lung base. Exam otherwise unremarkable .   Electronically Signed   By: Marcello Moores  Register   On: 12/12/2013 08:42     EKG Interpretation None    NSR, no ST changes, unchanged from 7/31  MDM   Final diagnoses:  Pulmonary embolism  Chest pain on breathing    #Bilateral PE: Oscar Fuller was diagnosed with bilateral PE and LLE DVT on recent hospitalization (7/31-8/2). He reports this right sided pleuritic pain is the same as that on his initial presentation. He was discharged on xarelto. CTA on 7/31 with multiple diffuse b/l acute PE with L lung base focal wedge-shaped consolidation that may indicate pulmonary infarct. Repeat CTA not ordered as it we would not expect change in past three days. Physical exam normal and sating mid to high 90s on room air. EKG unchanged from 7/31 in NSR with no ST changes and i-stat troponin 0.1. CXR notable for R lung base atelectasis but  otherwise unremarkable. Chemistry normal. This pain is likely residual and secondary to his known b/l PE. He just began xarelto therapy on his recent admission. -continue xarelto 15 mg po BID with meal until 8/22 then 20 mg po qhs with meal per 8/2 discharge -f/u with PCP in 1-2 days -percocet x 20 pills as wife feels vicodin isn't controlling pain -incentive spirometer 10 deep breaths/hr awake  Kelby Aline, MD 12/12/13 1028

## 2013-12-12 NOTE — Discharge Instructions (Signed)
Take percocet as needed for pain but not at same time as tylenol or vicodin. Continue your blood thinner, use your incentive spirometer 10x/waking hour. Return to ER if new symptoms, new or worsening pain, trouble breathing, fever. Follow-up with your PCP in 1-2 days.

## 2013-12-12 NOTE — Care Management Note (Signed)
    Page 1 of 1   12/12/2013     2:58:47 PM CARE MANAGEMENT NOTE 12/12/2013  Patient:  Oscar Fuller,Oscar Fuller   Account Number:  [id number]  Date Initiated:  12/12/2013  Documentation initiated by:  Annella Prowell  Subjective/Objective Assessment:   Pt dc'd from [place] on 12/11/13 for PE, dc'd on Xarelto therapy.  Pt has Pharmacist, community through [employer].     Action/Plan:   Called pt at home; verified that pt had received copay card for free Xarelto x 1 year.  Pt states he has received this. No further questions or concerns.   Anticipated DC Date:     Anticipated DC Plan:        DC Planning Services  CM consult  Medication Assistance      Choice offered to / List presented to:             Status of service:  Completed, signed off Medicare Important Message given?   (If response is "NO", the following Medicare IM given date fields will be blank) Date Medicare IM given:   Medicare IM given by:   Date Additional Medicare IM given:   Additional Medicare IM given by:    Discharge Disposition:  HOME/SELF CARE  Per UR Regulation:    If discussed at Long Length of Stay Meetings, dates discussed:    Comments:

## 2013-12-12 NOTE — ED Notes (Signed)
MD at bedside. 

## 2013-12-12 NOTE — ED Notes (Signed)
Pt off floor to Xray.

## 2013-12-15 ENCOUNTER — Ambulatory Visit (INDEPENDENT_AMBULATORY_CARE_PROVIDER_SITE_OTHER): Payer: BC Managed Care – PPO | Admitting: Internal Medicine

## 2013-12-15 ENCOUNTER — Encounter: Payer: Self-pay | Admitting: Internal Medicine

## 2013-12-15 VITALS — BP 130/83 | HR 77 | Temp 98.0°F | Wt 241.0 lb

## 2013-12-15 DIAGNOSIS — M76899 Other specified enthesopathies of unspecified lower limb, excluding foot: Secondary | ICD-10-CM

## 2013-12-15 DIAGNOSIS — I2699 Other pulmonary embolism without acute cor pulmonale: Secondary | ICD-10-CM

## 2013-12-15 DIAGNOSIS — M7061 Trochanteric bursitis, right hip: Secondary | ICD-10-CM

## 2013-12-15 NOTE — Patient Instructions (Signed)
Next visit is for routine check up regards the clot

## 2013-12-15 NOTE — Assessment & Plan Note (Addendum)
Recently admitted with Left lower leg DVT and  Bilateral PE one week after a trip to [place]. The diagnoses, prognosis and treatment was discussed with the patient. From this point on he is at a higher risk for clots, prevention discussed. While on xarelto he is at high risk  of bleeding, recommend to discontinue etodolac and avoid risky situations. Will need anticoagulation for 4-6 months, return to the office in 3 months, will recheck the issue at time of his CPX Because the PE- DVT was provoked, I don't believe he needs eval by hematology. Patient to call if he has any problems

## 2013-12-15 NOTE — Assessment & Plan Note (Signed)
Patient reports he eventually had MRI and had a small hip fracture. Taking  hydrocodone or oxycodone, never takes both at the same time.

## 2013-12-15 NOTE — Progress Notes (Signed)
Subjective:    Patient ID: Oscar Fuller, male    DOB: [birth year], 58 y.o.   MRN: [id number]  DOS:  12/15/2013 Type of visit - description: Hospital followup History:  Admitted to the hospital, discharge summary reviewed, excerpts:   Admit date: 12/09/2013  Discharge date: 12/11/2013  Discharge Diagnoses:  Bilateral pulmonary embolism  DVT (deep venous thrombosis)    History of present illness:  Oscar Fuller is a 58 y.o. male with a history of hyperlipidemia who was seen by his PCP and referred to the ED due to continued intermittent right sided chest pain since Wednesday. He described having 10/10 pain on Wednesday, and the pain was worse with a deep breath.   Hospital Course:  1. Acute PE -hemodynamics stable,  -Risk factor-recent long drive to [place] and back  -discussed Anticoagulation options, ans transitioned to Xarelto, needs at least 3-8mths of anticoagulation  -Venous dopplers with small LLE DVT  -discussed bleeding risk associated with this and recommended stoppng ASA     ROS Since he left the hospital, he is doing better.  Did have chest pain 2 days ago, went to the ER, chest x-ray was a stable, at this point the chest pain is resolved. Good compliance with medications No difficulty breathing, nausea, vomiting, diarrhea. Denies any blood in the stools, blood in the urine or gum bleeding.   Past Medical History  Diagnosis Date  . Hyperlipidemia   . Increased prostate specific antigen (PSA) velocity     saw Dr Joelyn Oms, s/p Bx(-) 2002  . SCC (squamous cell carcinoma)     Dr Sherrye Payor, in the back  . ED (erectile dysfunction) 07/2008    used some cialis, symptoms not active at present   . Hypertension     was on ACEi-diuretics  . Depression with anxiety   . Atypical chest pain     with ER visit in 8/11. ETT-myoview showed EF 61% no ischemia or infaraction 8/11  . OSA (obstructive sleep apnea) 06/08/2012    HST 05/2012:  AHI 34/hr with desat as low as 69%.   . Prediabetes  09/07/2013  . Pulmonary embolism 11-2013    PE and L DVT after a trip    Past Surgical History  Procedure Laterality Date  . Vasectomy    . Knee surgery  2009    left arthroscopy    History   Social History  . Marital Status: Married    Spouse Name: N/A    Number of Children: 2  . Years of Education: N/A   Occupational History  . [employer]    Social History Main Topics  . Smoking status: Never Smoker   . Smokeless tobacco: Never Used  . Alcohol Use: No     Comment: occassional--rare  . Drug Use: No  . Sexual Activity: Not on file   Other Topics Concern  . Not on file   Social History Narrative   Married x 2  , 2nd wife [name] has a h/o SZ d/o               Medication List       This list is accurate as of: 12/15/13  5:50 PM.  Always use your most recent med list.               atorvastatin 40 MG tablet  Commonly known as:  LIPITOR  Take 40 mg by mouth daily.     cyclobenzaprine 5 MG tablet  Commonly known as:  FLEXERIL  Take 5 mg by mouth 3 (three) times daily as needed for muscle spasms.     HYDROcodone-acetaminophen 5-325 MG per tablet  Commonly known as:  NORCO/VICODIN  Take 1 tablet by mouth every 6 (six) hours as needed for moderate pain.     nitroGLYCERIN 0.2 mg/hr patch  Commonly known as:  NITRODUR - Dosed in mg/24 hr  1 patch as needed.     oxyCODONE-acetaminophen 5-325 MG per tablet  Commonly known as:  PERCOCET/ROXICET  Take 1-2 tablets by mouth every 6 (six) hours as needed for severe pain.     Rivaroxaban 15 MG Tabs tablet  Commonly known as:  XARELTO  Take 1 tablet (15 mg total) by mouth 2 (two) times daily with a meal.     rivaroxaban 20 MG Tabs tablet  Commonly known as:  XARELTO  Take 1 tablet (20 mg total) by mouth daily with supper.  Start taking on:  12/31/2013     sertraline 50 MG tablet  Commonly known as:  ZOLOFT  Take 50 mg by mouth daily.     tamsulosin 0.4 MG Caps capsule  Commonly known as:  FLOMAX  Take 1  capsule (0.4 mg total) by mouth daily.     zolpidem 10 MG tablet  Commonly known as:  AMBIEN  Take 10 mg by mouth at bedtime as needed for sleep.           Objective:   Physical Exam BP 130/83  Pulse 77  Temp(Src) 98 F (36.7 C) (Oral)  Wt 241 lb (109.317 kg)  SpO2 96%  General -- alert, well-developed, NAD.   Lungs -- normal respiratory effort, no intercostal retractions, no accessory muscle use, and normal breath sounds.  Heart-- normal rate, regular rhythm, no murmur.  Extremities-- no pretibial edema bilaterally ; calves symmetric, L leg slt larger just above the ankle but no tender  Neurologic--  alert & oriented X3. Speech normal, gait appropriate for age, strength symmetric and appropriate for age.   Psych-- Cognition and judgment appear intact. Cooperative with normal attention span and concentration. No anxious or depressed appearing.     Assessment & Plan:

## 2014-01-03 ENCOUNTER — Telehealth: Payer: Self-pay

## 2014-01-03 NOTE — Telephone Encounter (Signed)
Was sent to the ER, agree

## 2014-01-03 NOTE — Telephone Encounter (Signed)
Patient Information:  Caller Name: Gorman  Phone: [phone]  Patient: Oscar Fuller, Oscar Fuller  Gender: Male  DOB: Dec 17, 1955  Age: 58 Years  PCP: [name]  Office Follow Up:  Does the office need to follow up with this patient?: No  Instructions For The Office: N/A  RN Note:  Call transferred from [place], in office, for triage of patient. Patient was hospitalized for DVT left leg and Bilateral Pulmonary Embolism 07/31//15. Patient is currently taking Xarelto. States Xarelto was increased to 20mg . 01/03/14. Patient states he is currently traveling, by car, from [place]. Patient states he developed pain in bilateral lower anterior rib cage at approx. 1130 01/03/14. Patient states pain does not increase with deep inspiration. Patient denies dyspnea. Denies hemoptysis. Denies calf pain. Care advice given per guidelines. Patient advised to be seen immediately in the nearest Emergency Department. Patient advised not to drive self. Patient advised to call 911 if he developes dyspnea, chest pain or increase in sx. Patient verbalizes understanding and agreeable. Patient agrees to go to the nearest ED now for evaluation.  Symptoms  Reason For Call & Symptoms: Pain in bilateral lower, anterior ribs  Reviewed Health History In EMR: Yes  Reviewed Medications In EMR: Yes  Reviewed Allergies In EMR: Yes  Reviewed Surgeries / Procedures: Yes  Date of Onset of Symptoms: 01/03/2014  Guideline(s) Used:  Chest Pain  Disposition Per Guideline:   Go to ED Now  Reason For Disposition Reached:   History of prior "blood clot" in leg or lungs (i.e., deep vein thrombosis, pulmonary embolism)  Advice Given:  Call Back If:  Severe chest pain  Difficulty breathing  You become worse.  Patient Will Follow Care Advice:  YES

## 2014-01-03 NOTE — Telephone Encounter (Signed)
Oscar Fuller Spouse [name] called concerned about Oscar Fuller, he is having same symptoms as he had first of the month. Tightness in chest, he had clots in the lungs and was hospitalized. So I called call a nurse

## 2014-01-06 ENCOUNTER — Other Ambulatory Visit: Payer: Self-pay | Admitting: Internal Medicine

## 2014-01-10 ENCOUNTER — Telehealth: Payer: Self-pay

## 2014-01-10 NOTE — Telephone Encounter (Signed)
Spoke with Pt and gave him Dr. Ethel Rana recommendations, he will try tylenol and see if that helps.

## 2014-01-10 NOTE — Telephone Encounter (Signed)
LMOM for Pt to return call.  

## 2014-01-10 NOTE — Telephone Encounter (Signed)
Oscar Fuller [phone]  Waylon called he has just left his ortho doctor and they want to put him on Celebrex for his hip pain, they wanted him to check with Dr Larose Kells about him being on rivaroxaban (XARELTO) 20 MG TABS tablet and Celebrex

## 2014-01-10 NOTE — Telephone Encounter (Signed)
Please advise 

## 2014-01-10 NOTE — Telephone Encounter (Signed)
That may increase his risk of bleeding, rec to try tylenol; if needs something stronger, let me know

## 2014-01-30 ENCOUNTER — Other Ambulatory Visit: Payer: Self-pay

## 2014-01-30 ENCOUNTER — Encounter: Payer: Self-pay | Admitting: Gastroenterology

## 2014-01-30 MED ORDER — RIVAROXABAN 20 MG PO TABS: 20.0000 mg | ORAL_TABLET | Freq: Every day | ORAL | Status: DC

## 2014-02-06 ENCOUNTER — Other Ambulatory Visit: Payer: Self-pay

## 2014-02-06 MED ORDER — TAMSULOSIN HCL 0.4 MG PO CAPS: 0.4000 mg | ORAL_CAPSULE | Freq: Every day | ORAL | Status: DC

## 2014-02-14 ENCOUNTER — Ambulatory Visit (INDEPENDENT_AMBULATORY_CARE_PROVIDER_SITE_OTHER): Payer: BC Managed Care – PPO | Admitting: Internal Medicine

## 2014-02-14 ENCOUNTER — Encounter: Payer: Self-pay | Admitting: Internal Medicine

## 2014-02-14 VITALS — BP 142/88 | HR 94 | Temp 99.0°F | Wt 249.0 lb

## 2014-02-14 DIAGNOSIS — F329 Major depressive disorder, single episode, unspecified: Secondary | ICD-10-CM

## 2014-02-14 DIAGNOSIS — F418 Other specified anxiety disorders: Secondary | ICD-10-CM

## 2014-02-14 DIAGNOSIS — G4700 Insomnia, unspecified: Secondary | ICD-10-CM

## 2014-02-14 DIAGNOSIS — F419 Anxiety disorder, unspecified: Secondary | ICD-10-CM

## 2014-02-14 DIAGNOSIS — I82409 Acute embolism and thrombosis of unspecified deep veins of unspecified lower extremity: Secondary | ICD-10-CM

## 2014-02-14 DIAGNOSIS — Z23 Encounter for immunization: Secondary | ICD-10-CM

## 2014-02-14 DIAGNOSIS — I82402 Acute embolism and thrombosis of unspecified deep veins of left lower extremity: Secondary | ICD-10-CM

## 2014-02-14 HISTORY — DX: Insomnia, unspecified: G47.00

## 2014-02-14 MED ORDER — SERTRALINE HCL 100 MG PO TABS: 100.0000 mg | ORAL_TABLET | Freq: Every day | ORAL | Status: DC

## 2014-02-14 MED ORDER — ZOLPIDEM TARTRATE 10 MG PO TABS
10.0000 mg | ORAL_TABLET | Freq: Every evening | ORAL | Status: DC | PRN
Start: 2014-02-14 — End: 2015-03-05

## 2014-02-14 NOTE — Patient Instructions (Signed)
Increase sertraline to 100 mg daily Prescriptions for [facility]ertraline and amb[facility]

## 2014-02-14 NOTE — Assessment & Plan Note (Signed)
Refill Ambien

## 2014-02-14 NOTE — Assessment & Plan Note (Addendum)
See history of present illness, needs better control, under a lot of stress. Plan: Increase sertraline to 100 mg daily, has a followup with me next month, will reassess then. Recommend to see a counselor, information provided

## 2014-02-14 NOTE — Assessment & Plan Note (Signed)
On xarelto, still has occasional left calf pain and peri-ankle edema. Plan: Continue with xarelto; check a ultrasound to rule out residual or a  new clot and venous insufficiency

## 2014-02-14 NOTE — Progress Notes (Signed)
Subjective:    Patient ID: Oscar Fuller, male    DOB: [birth year], 58 y.o.   MRN: [id number]  DOS:  02/14/2014 Type of visit - description : To discuss several issues Interval history: Anxiety, depression, on sertraline for a while, under a ot of stress, at work and home. Wife states he is irritable. He wonders if could increase sertraline.  History of a PE, DVT on xarelto, still had some left ankle swelling and occasional calf pain, no claudication per se  Insomnia, needs a refill and Ambien Fatigue, sx started since he started xarelto, by 3 PM he "rans out of energy". Has a CPAP, on auto setting, good compliance, no apparent leak.  ROS Denies chest pain or difficulty breathing No nausea, vomiting, diarrhea or blood in the stools. No sputum production or hemoptysis No suicidal ideas No gross hematuria  Past Medical History  Diagnosis Date  . Hyperlipidemia   . Increased prostate specific antigen (PSA) velocity     saw Dr Joelyn Oms, s/p Bx(-) 2002  . SCC (squamous cell carcinoma)     Dr Sherrye Payor, in the back  . ED (erectile dysfunction) 07/2008    used some cialis, symptoms not active at present   . Hypertension     was on ACEi-diuretics  . Depression with anxiety   . Atypical chest pain     with ER visit in 8/11. ETT-myoview showed EF 61% no ischemia or infaraction 8/11  . OSA (obstructive sleep apnea) 06/08/2012    HST 05/2012:  AHI 34/hr with desat as low as 69%.   . Prediabetes 09/07/2013  . Pulmonary embolism 11-2013    PE and L DVT after a trip    Past Surgical History  Procedure Laterality Date  . Vasectomy    . Knee surgery  2009    left arthroscopy    History   Social History  . Marital Status: Married    Spouse Name: N/A    Number of Children: 2  . Years of Education: N/A   Occupational History  . [employer]    Social History Main Topics  . Smoking status: Never Smoker   . Smokeless tobacco: Never Used  . Alcohol Use: No     Comment: occassional--rare    . Drug Use: No  . Sexual Activity: Not on file   Other Topics Concern  . Not on file   Social History Narrative   Married x 2  , 2nd wife [name] has a h/o SZ d/o               Medication List       This list is accurate as of: 02/14/14  7:56 PM.  Always use your most recent med list.               atorvastatin 40 MG tablet  Commonly known as:  LIPITOR  Take 40 mg by mouth daily.     cyclobenzaprine 5 MG tablet  Commonly known as:  FLEXERIL  Take 5 mg by mouth 3 (three) times daily as needed for muscle spasms.     nitroGLYCERIN 0.2 mg/hr patch  Commonly known as:  NITRODUR - Dosed in mg/24 hr  1 patch as needed.     rivaroxaban 20 MG Tabs tablet  Commonly known as:  XARELTO  Take 1 tablet (20 mg total) by mouth daily with supper.     sertraline 100 MG tablet  Commonly known as:  ZOLOFT  Take 1 tablet (100  mg total) by mouth daily.     tamsulosin 0.4 MG Caps capsule  Commonly known as:  FLOMAX  Take 1 capsule (0.4 mg total) by mouth daily.     zolpidem 10 MG tablet  Commonly known as:  AMBIEN  Take 1 tablet (10 mg total) by mouth at bedtime as needed for sleep.           Objective:   Physical Exam BP 142/88  Pulse 94  Temp(Src) 99 F (37.2 C) (Oral)  Wt 249 lb (112.946 kg)  SpO2 94% General -- alert, well-developed, NAD.    Lungs -- normal respiratory effort, no intercostal retractions, no accessory muscle use, and normal breath sounds.  Heart-- normal rate, regular rhythm, no murmur.   Extremities-- calves symetric and not tender.  +/+++ L periankle edema Neurologic--  alert & oriented X3. Speech normal, gait appropriate for age, strength symmetric and appropriate for age.  Psych-- Cognition and judgment appear intact. Cooperative with normal attention span and concentration. No anxious or depressed appearing.     Assessment & Plan:     Fatigue, Review of systems does not point to a particular etiology, recent labs okay. Related to  depression/anxiety? Reassess in one month

## 2014-02-14 NOTE — Progress Notes (Signed)
Pre visit review using our clinic review tool, if applicable. No additional management support is needed unless otherwise documented below in the visit note. 

## 2014-02-15 ENCOUNTER — Ambulatory Visit (HOSPITAL_BASED_OUTPATIENT_CLINIC_OR_DEPARTMENT_OTHER)
Admission: RE | Admit: 2014-02-15 | Discharge: 2014-02-15 | Disposition: A | Discharge status: Home / Self Care | Payer: BC Managed Care – PPO | Source: Ambulatory Visit | Attending: Internal Medicine | Admitting: Internal Medicine

## 2014-02-15 DIAGNOSIS — I82402 Acute embolism and thrombosis of unspecified deep veins of left lower extremity: Secondary | ICD-10-CM | POA: Insufficient documentation

## 2014-02-15 DIAGNOSIS — I82409 Acute embolism and thrombosis of unspecified deep veins of unspecified lower extremity: Secondary | ICD-10-CM

## 2014-03-02 ENCOUNTER — Other Ambulatory Visit: Payer: Self-pay | Admitting: Internal Medicine

## 2014-03-02 ENCOUNTER — Other Ambulatory Visit: Payer: Self-pay

## 2014-03-10 ENCOUNTER — Telehealth: Payer: Self-pay | Admitting: Internal Medicine

## 2014-03-10 NOTE — Telephone Encounter (Signed)
A user error has taken place.

## 2014-03-13 ENCOUNTER — Encounter: Payer: Self-pay | Admitting: Internal Medicine

## 2014-03-13 ENCOUNTER — Ambulatory Visit (INDEPENDENT_AMBULATORY_CARE_PROVIDER_SITE_OTHER): Payer: BC Managed Care – PPO | Admitting: Internal Medicine

## 2014-03-13 VITALS — BP 141/88 | HR 66 | Temp 98.3°F | Ht 69.0 in | Wt 250.4 lb

## 2014-03-13 DIAGNOSIS — R030 Elevated blood-pressure reading, without diagnosis of hypertension: Secondary | ICD-10-CM

## 2014-03-13 DIAGNOSIS — G4733 Obstructive sleep apnea (adult) (pediatric): Secondary | ICD-10-CM

## 2014-03-13 DIAGNOSIS — I2699 Other pulmonary embolism without acute cor pulmonale: Secondary | ICD-10-CM

## 2014-03-13 DIAGNOSIS — Z0000 Encounter for general adult medical examination without abnormal findings: Secondary | ICD-10-CM

## 2014-03-13 LAB — ALT: ALT: 28 U/L (ref 0–53)

## 2014-03-13 LAB — CBC WITH DIFFERENTIAL/PLATELET
Basophils Absolute: 0 10*3/uL (ref 0.0–0.1)
Basophils Relative: 0.5 % (ref 0.0–3.0)
EOS ABS: 0.3 10*3/uL (ref 0.0–0.7)
EOS PCT: 3.6 % (ref 0.0–5.0)
HCT: 48.2 % (ref 39.0–52.0)
HEMOGLOBIN: 15.7 g/dL (ref 13.0–17.0)
LYMPHS PCT: 18.5 % (ref 12.0–46.0)
Lymphs Abs: 1.7 10*3/uL (ref 0.7–4.0)
MCHC: 32.6 g/dL (ref 30.0–36.0)
MCV: 89.8 fl (ref 78.0–100.0)
MONO ABS: 0.7 10*3/uL (ref 0.1–1.0)
Monocytes Relative: 7.5 % (ref 3.0–12.0)
NEUTROS ABS: 6.3 10*3/uL (ref 1.4–7.7)
Neutrophils Relative %: 69.9 % (ref 43.0–77.0)
Platelets: 206 10*3/uL (ref 150.0–400.0)
RBC: 5.37 Mil/uL (ref 4.22–5.81)
RDW: 14 % (ref 11.5–15.5)
WBC: 9.1 10*3/uL (ref 4.0–10.5)

## 2014-03-13 LAB — AST: AST: 25 U/L (ref 0–37)

## 2014-03-13 LAB — HEMOGLOBIN A1C: Hgb A1c MFr Bld: 5.8 % (ref 4.6–6.5)

## 2014-03-13 LAB — LIPID PANEL
CHOL/HDL RATIO: 3
Cholesterol: 106 mg/dL (ref 0–200)
HDL: 33.5 mg/dL — AB (ref >39.00)
LDL CALC: 56 mg/dL (ref 0–99)
NonHDL: 72.5
Triglycerides: 82 mg/dL (ref 0.0–149.0)
VLDL: 16.4 mg/dL (ref 0.0–40.0)

## 2014-03-13 LAB — PSA: PSA: 2.08 ng/mL (ref 0.10–4.00)

## 2014-03-13 MED ORDER — SOLIFENACIN SUCCINATE 5 MG PO TABS: 5.0000 mg | ORAL_TABLET | Freq: Every day | ORAL | Status: DC

## 2014-03-13 NOTE — Progress Notes (Signed)
Subjective:    Patient ID: Oscar Fuller, male    DOB: Jan 12, 1956, 58 y.o.   MRN: [id number]  DOS:  03/13/2014 Type of visit - description : cpx Interval history: Ran out of Flomax 3 days ago, symptoms are about the same w or w/o med, certain things -like changes in temperature- make him go to the bathroom quickly and very little urine is produce.    Ambulatory BPs 130/80, 140/90 sometimes    ROS Denies chest pain or difficulty breathing No dyspnea on excision No nausea, vomiting, diarrhea. No blood in the stools No cough or bronchial congestion No dysuria, gross hematuria or difficulty urinating  Past Medical History  Diagnosis Date  . Hyperlipidemia   . Increased prostate specific antigen (PSA) velocity     saw Dr Joelyn Oms, s/p Bx(-) 2002  . SCC (squamous cell carcinoma)     Dr Sherrye Payor, in the back  . ED (erectile dysfunction) 07/2008    used some cialis, symptoms not active at present   . Hypertension     was on ACEi-diuretics  . Depression with anxiety   . Atypical chest pain     with ER visit in 8/11. ETT-myoview showed EF 61% no ischemia or infaraction 8/11  . OSA (obstructive sleep apnea) 06/08/2012    HST 05/2012:  AHI 34/hr with desat as low as 69%.   . Prediabetes 09/07/2013  . Pulmonary embolism 11-2013    PE and L DVT after a trip    Past Surgical History  Procedure Laterality Date  . Vasectomy    . Knee surgery  2009    left arthroscopy    History   Social History  . Marital Status: Married    Spouse Name: N/A    Number of Children: 2  . Years of Education: N/A   Occupational History  . [employer]    Social History Main Topics  . Smoking status: Never Smoker   . Smokeless tobacco: Never Used  . Alcohol Use: No     Comment: occassional--rare  . Drug Use: No  . Sexual Activity: Not on file   Other Topics Concern  . Not on file   Social History Narrative   Married x 2  , 2nd wife Oscar Fuller has a h/o SZ d/o            Family History  Problem  Relation Age of Onset  . Colon cancer Neg Hx   . Prostate cancer Neg Hx   . Coronary artery disease Father     stent 13, GF GM  . Heart attack Brother     F B M GM  . Hypertension Father     F B  . Lung cancer Father   . Glaucoma Mother   . Stroke Other     CVA GM  . Stomach cancer Paternal Grandfather   . Gout Brother   . Migraines Sister        Medication List       This list is accurate as of: 03/13/14  6:29 PM.  Always use your most recent med list.               atorvastatin 40 MG tablet  Commonly known as:  LIPITOR  TAKE 1 TABLET DAILY     rivaroxaban 20 MG Tabs tablet  Commonly known as:  XARELTO  Take 1 tablet (20 mg total) by mouth daily with supper.     sertraline 100 MG tablet  Commonly  known as:  ZOLOFT  Take 1 tablet (100 mg total) by mouth daily.     solifenacin 5 MG tablet  Commonly known as:  VESICARE  Take 1 tablet (5 mg total) by mouth daily.     zolpidem 10 MG tablet  Commonly known as:  AMBIEN  Take 1 tablet (10 mg total) by mouth at bedtime as needed for sleep.           Objective:   Physical Exam BP 141/88 mmHg  Pulse 66  Temp(Src) 98.3 F (36.8 C) (Oral)  Ht 5\' 9"  (1.753 m)  Wt 250 lb 6 oz (113.569 kg)  BMI 36.96 kg/m2  SpO2 94%  General -- alert, well-developed, NAD.  Neck --no thyromegaly , normal carotid pulse  HEENT-- Not pale.   Lungs -- normal respiratory effort, no intercostal retractions, no accessory muscle use, and normal breath sounds.  Heart-- normal rate, regular rhythm, no murmur.  Abdomen-- Not distended, good bowel sounds,soft, non-tender. Rectal-- No external abnormalities noted. Normal sphincter tone. No rectal masses or tenderness. Stool brown  Prostate--Prostate gland firm and smooth, no enlargement, nodularity, tenderness, mass, asymmetry or induration. Extremities-- no pretibial edema bilaterally  Neurologic--  alert & oriented X3. Speech normal, gait appropriate for age, strength symmetric and  appropriate for age.  Psych-- Cognition and judgment appear intact. Cooperative with normal attention span and concentration. No anxious or depressed appearing.      Assessment & Plan:

## 2014-03-13 NOTE — Assessment & Plan Note (Addendum)
Td 2013 Had a flu shot    colonoscopy  08/2008, tubular adenoma, next is due---- refer to GI LUTS:initially responded to Flomax but he continue with symptoms.DRE WNL , check a PSA   trial w/ Vesicare , if no better---> urology?  patient has a FH of CAD ---->stress test neg 12-2009 Diet-exercise-wt loss: discussed today

## 2014-03-13 NOTE — Assessment & Plan Note (Signed)
Doing well, we again discussed diagnosis, the fact that he is high risk for future clots. Plan: Discontinue Xarelto  after 6 months ~ 06-2014 F/u 6 months

## 2014-03-13 NOTE — Patient Instructions (Signed)
Get your blood work before you leave   Check the  blood pressure  weekly  Be sure your blood pressure is between  145/85  and 110/65.  if it is consistently higher or lower, let me known    Please come back to the office in 6 months  for a routine check up , no  fasting     Take xarelto until ~ 06-2014  Try vesicare once a day , if no working or side effects--- call us

## 2014-03-13 NOTE — Progress Notes (Signed)
Pre visit review using our clinic review tool, if applicable. No additional management support is needed unless otherwise documented below in the visit note. 

## 2014-03-15 ENCOUNTER — Encounter: Payer: Self-pay | Admitting: Gastroenterology

## 2014-03-15 ENCOUNTER — Telehealth: Payer: Self-pay | Admitting: *Deleted

## 2014-03-15 NOTE — Telephone Encounter (Signed)
Vesicare is not covered by patient's insurance. Covered alternatives are listed below. Please advise. JG//CMA OXYBUTYNIN CHLORIDE TABS 5MG   OXYBUTYNIN CL ER TABS 5MG   OXYBUTYNIN CL ER TABS 10MG   OXYBUTYNIN CL ER TABS 15MG   TROSPIUM CHLORIDE TABS 20MG   TOLTERODINE TARTRATE TABS 1MG   TOLTERODINE TARTRATE TABS 2MG    TROSPIUM CHLORIDE ER CAPS 60MG 

## 2014-03-15 NOTE — Telephone Encounter (Signed)
Will try oxybutynin ER 10 mg daily, a prescription is ready to be send to his pharmacy of choice, # 90 and 1refill or # 30 and 6 refills Let patient know that due to  insurance restrictions we need to change medications

## 2014-03-17 MED ORDER — OXYBUTYNIN CHLORIDE ER 10 MG PO TB24: 10.0000 mg | ORAL_TABLET | Freq: Every day | ORAL | Status: DC

## 2014-03-17 NOTE — Telephone Encounter (Signed)
Called and spoke with patient and he was ok with the change to oxybutynin ER 10 mg. Script e-scribed to [facility]. JG//CMA

## 2014-03-28 ENCOUNTER — Ambulatory Visit (INDEPENDENT_AMBULATORY_CARE_PROVIDER_SITE_OTHER): Payer: BC Managed Care – PPO | Admitting: Gastroenterology

## 2014-03-28 ENCOUNTER — Telehealth: Payer: Self-pay | Admitting: *Deleted

## 2014-03-28 ENCOUNTER — Encounter: Payer: Self-pay | Admitting: Gastroenterology

## 2014-03-28 VITALS — BP 110/70 | HR 68 | Ht 68.11 in | Wt 249.1 lb

## 2014-03-28 DIAGNOSIS — Z7901 Long term (current) use of anticoagulants: Secondary | ICD-10-CM

## 2014-03-28 DIAGNOSIS — Z860100 Personal history of colon polyps, unspecified: Secondary | ICD-10-CM

## 2014-03-28 HISTORY — DX: Long term (current) use of anticoagulants: Z79.01

## 2014-03-28 HISTORY — DX: Personal history of colon polyps, unspecified: Z86.0100

## 2014-03-28 MED ORDER — MOVIPREP 100 G PO SOLR: 1.0000 | Freq: Once | ORAL | Status: DC

## 2014-03-28 NOTE — Telephone Encounter (Signed)
  03/28/2014   RE: Oscar Fuller Offer DOB: 1955/08/01 MRN: [id number]   Dear Dr. Larose Kells,    We have scheduled the above patient for an Colonoscopy. Our records show that he is on anticoagulation therapy.   Please advise as to how long the patient may come off his therapy of Xarelto prior to the procedure, which is scheduled for 05-09-2014.  Please route the completed form to Evette Georges., CMA.   Sincerely,    Hope Pigeon

## 2014-03-28 NOTE — Telephone Encounter (Signed)
lmom for patient to call back 

## 2014-03-28 NOTE — Telephone Encounter (Signed)
Called patient back Advised patient per Dr. Larose Kells okay to stop Xarelto 24 hrs prior to procedure Patient verbalized understanding

## 2014-03-28 NOTE — Telephone Encounter (Signed)
Pt returning kelly's call

## 2014-03-28 NOTE — Telephone Encounter (Signed)
Patient will stop Xarelto  February 2016, could he have the colonoscopy after February 2016?

## 2014-03-28 NOTE — Telephone Encounter (Signed)
Dr. Larose Kells,   We do not have a schedule out past February yet Patient will have to pay for another office visit if we move Colonoscopy date Patient does not want to have to come back and pay, wants to get it over with  Will it be okay to stop Xarelto 24 hrs prior to procedure Please advise

## 2014-03-28 NOTE — Progress Notes (Signed)
Reviewed and agree with management plan.  Drema Eddington T. Roquel Burgin, MD FACG 

## 2014-03-28 NOTE — Telephone Encounter (Signed)
I discussed this with the patient.  We do not have the schedule through February yet, which would mean that he would have to come back for another visit before then if we waited until then.  That's why we went ahead since he was already here today.  Only other option is to wait until he is off of the Xarelto and bring him back for a pre-visit with nurse at no charge, but that means his visit today was unnecessary and did not accomplish anything.  Thank you,  Jess

## 2014-03-28 NOTE — Telephone Encounter (Signed)
Yes

## 2014-03-28 NOTE — Progress Notes (Signed)
03/28/2014 Oscar Fuller [id number] [birth year]   HISTORY OF PRESENT ILLNESS:  This is a pleasant 58 year old male how is known to Dr. Fuller Plan for colonoscopy in 08/2008 at which time he had one polyp removed that was a tubular adenoma on pathology; repeat colonoscopy was recommended in 5 years.  He is here today to schedule procedure.  He has no GI complaints.  He is on Xarelto for DVT and PE discovered back in August after a long flight to [place] and [place].  Plan is to continue Xarelto for 6 months, finishing some time in February.   Past Medical History  Diagnosis Date  . Hyperlipidemia   . Increased prostate specific antigen (PSA) velocity     saw Dr Joelyn Oms, s/p Bx(-) 2002  . SCC (squamous cell carcinoma)     Dr Sherrye Payor, in the back  . ED (erectile dysfunction) 07/2008    used some cialis, symptoms not active at present   . Hypertension     was on ACEi-diuretics  . Depression with anxiety   . Atypical chest pain     with ER visit in 8/11. ETT-myoview showed EF 61% no ischemia or infaraction 8/11  . OSA (obstructive sleep apnea) 06/08/2012    HST 05/2012:  AHI 34/hr with desat as low as 69%.   . Prediabetes 09/07/2013  . Pulmonary embolism 11-2013    PE and L DVT after a trip   Past Surgical History  Procedure Laterality Date  . Vasectomy    . Knee surgery  2009    left arthroscopy    reports that he has never smoked. He has never used smokeless tobacco. He reports that he does not drink alcohol or use illicit drugs. family history includes Coronary artery disease in his father; Glaucoma in his mother; Gout in his brother; Heart attack in his brother; Hypertension in his father; Lung cancer in his father; Migraines in his sister; Stomach cancer in his paternal grandfather; Stroke in his other. There is no history of Colon cancer or Prostate cancer. No Known Allergies    Outpatient Encounter Prescriptions as of 03/28/2014  Medication Sig  . atorvastatin (LIPITOR) 40 MG  tablet TAKE 1 TABLET DAILY  . oxybutynin (DITROPAN XL) 10 MG 24 hr tablet Take 1 tablet (10 mg total) by mouth at bedtime.  . rivaroxaban (XARELTO) 20 MG TABS tablet Take 1 tablet (20 mg total) by mouth daily with supper.  . sertraline (ZOLOFT) 100 MG tablet Take 1 tablet (100 mg total) by mouth daily.  . solifenacin (VESICARE) 5 MG tablet Take 1 tablet (5 mg total) by mouth daily.  Marland Kitchen zolpidem (AMBIEN) 10 MG tablet Take 1 tablet (10 mg total) by mouth at bedtime as needed for sleep.     REVIEW OF SYSTEMS  : All other systems reviewed and negative except where noted in the History of Present Illness.   PHYSICAL EXAM: BP 110/70 mmHg  Pulse 68  Ht 5' 8.11" (1.73 m)  Wt 249 lb 2 oz (113.002 kg)  BMI 37.76 kg/m2 General: Well developed white male in no acute distress Head: Normocephalic and atraumatic Eyes:  Sclerae anicteric, conjunctiva pink. Ears: Normal auditory acuity. Lungs: Clear throughout to auscultation Heart: Regular rate and rhythm Abdomen: Soft, non-distended.  Normal bowel sounds.  Non-tender. Rectal:  Will be performed at the time of colonoscopy. Musculoskeletal: Symmetrical with no gross deformities  Skin: No lesions on visible extremities Extremities: No edema  Neurological: Alert oriented x 4,  grossly non-focal Psychological:  Alert and cooperative. Normal mood and affect  ASSESSMENT AND PLAN: -Personal history of colon polyp:  TA on colonoscopy in 08/2008.  Will schedule colonoscopy with Dr. Fuller Plan.  The risks, benefits, and alternatives were discussed with the patient and he consents to proceed. -Chronic anticoagulation for DVT and PE after a hip fracture and long flight:  On Xarelto until February.  The risks benefits and alternatives to a temporary hold of anti-coagulants/anti-platelets for the procedure were discussed with the patient he consents to proceed. Obtain clearance from Dr. Larose Kells for ok to hold Xarelto.

## 2014-03-28 NOTE — Addendum Note (Signed)
Addended by: Hope Pigeon A on: 03/28/2014 09:53 AM   Modules accepted: Orders

## 2014-03-28 NOTE — Telephone Encounter (Signed)
Oscar Fuller,   Dr. Larose Kells stated below patient will be off of Xarelto February 2016 can patient have procedure after February 2016 Please advise  Then I will call and ask patient

## 2014-03-28 NOTE — Patient Instructions (Signed)
You have been scheduled for a colonoscopy. Please follow written instructions given to you at your visit today.  Please pick up your prep kit at the pharmacy within the next 1-3 days. If you use inhalers (even only as needed), please bring them with you on the day of your procedure. Your physician has requested that you go to www.startemmi.com and enter the access code given to you at your visit today.( SENT TO YOUR E-MAIL) This web site gives a general overview about your procedure. However, you should still follow specific instructions given to you by our office regarding your preparation for the procedure.  

## 2014-04-08 ENCOUNTER — Emergency Department (HOSPITAL_COMMUNITY): Payer: BC Managed Care – PPO

## 2014-04-08 ENCOUNTER — Emergency Department (HOSPITAL_COMMUNITY)
Admission: EM | Admit: 2014-04-08 | Discharge: 2014-04-08 | Disposition: A | Discharge status: Home / Self Care | Payer: BC Managed Care – PPO | Attending: Emergency Medicine | Admitting: Emergency Medicine

## 2014-04-08 ENCOUNTER — Encounter (HOSPITAL_COMMUNITY): Payer: Self-pay | Admitting: *Deleted

## 2014-04-08 DIAGNOSIS — I10 Essential (primary) hypertension: Secondary | ICD-10-CM | POA: Insufficient documentation

## 2014-04-08 DIAGNOSIS — R079 Chest pain, unspecified: Secondary | ICD-10-CM

## 2014-04-08 DIAGNOSIS — Z86718 Personal history of other venous thrombosis and embolism: Secondary | ICD-10-CM | POA: Diagnosis not present

## 2014-04-08 DIAGNOSIS — Z86711 Personal history of pulmonary embolism: Secondary | ICD-10-CM | POA: Diagnosis not present

## 2014-04-08 DIAGNOSIS — Z859 Personal history of malignant neoplasm, unspecified: Secondary | ICD-10-CM | POA: Insufficient documentation

## 2014-04-08 DIAGNOSIS — Z79899 Other long term (current) drug therapy: Secondary | ICD-10-CM | POA: Diagnosis not present

## 2014-04-08 DIAGNOSIS — E785 Hyperlipidemia, unspecified: Secondary | ICD-10-CM | POA: Diagnosis not present

## 2014-04-08 DIAGNOSIS — Z8669 Personal history of other diseases of the nervous system and sense organs: Secondary | ICD-10-CM | POA: Insufficient documentation

## 2014-04-08 DIAGNOSIS — R05 Cough: Secondary | ICD-10-CM | POA: Insufficient documentation

## 2014-04-08 DIAGNOSIS — Z87438 Personal history of other diseases of male genital organs: Secondary | ICD-10-CM | POA: Insufficient documentation

## 2014-04-08 DIAGNOSIS — R0602 Shortness of breath: Secondary | ICD-10-CM | POA: Diagnosis not present

## 2014-04-08 DIAGNOSIS — F418 Other specified anxiety disorders: Secondary | ICD-10-CM | POA: Diagnosis not present

## 2014-04-08 HISTORY — DX: Acute embolism and thrombosis of unspecified deep veins of unspecified lower extremity: I82.409

## 2014-04-08 LAB — BASIC METABOLIC PANEL
Anion gap: 12 (ref 5–15)
BUN: 13 mg/dL (ref 6–23)
CO2: 27 meq/L (ref 19–32)
CREATININE: 1.07 mg/dL (ref 0.50–1.35)
Calcium: 9.4 mg/dL (ref 8.4–10.5)
Chloride: 103 mEq/L (ref 96–112)
GFR calc non Af Amer: 76 mL/min — ABNORMAL LOW (ref >90)
GFR, EST AFRICAN AMERICAN: 88 mL/min — AB (ref >90)
Glucose, Bld: 100 mg/dL — ABNORMAL HIGH (ref 70–99)
Potassium: 4.3 mEq/L (ref 3.7–5.3)
Sodium: 142 mEq/L (ref 137–147)

## 2014-04-08 LAB — CBC
HEMATOCRIT: 46.6 % (ref 39.0–52.0)
Hemoglobin: 15.8 g/dL (ref 13.0–17.0)
MCH: 29.6 pg (ref 26.0–34.0)
MCHC: 33.9 g/dL (ref 30.0–36.0)
MCV: 87.3 fL (ref 78.0–100.0)
Platelets: 188 10*3/uL (ref 150–400)
RBC: 5.34 MIL/uL (ref 4.22–5.81)
RDW: 13.2 % (ref 11.5–15.5)
WBC: 13.6 10*3/uL — AB (ref 4.0–10.5)

## 2014-04-08 LAB — I-STAT TROPONIN, ED: Troponin i, poc: 0 ng/mL (ref 0.00–0.08)

## 2014-04-08 LAB — PRO B NATRIURETIC PEPTIDE: Pro B Natriuretic peptide (BNP): 94.9 pg/mL (ref 0–125)

## 2014-04-08 MED ORDER — IOHEXOL 350 MG/ML SOLN
80.0000 mL | Freq: Once | INTRAVENOUS | Status: AC | PRN
  Administered 2014-04-08: 80 mL via INTRAVENOUS

## 2014-04-08 NOTE — Discharge Instructions (Signed)
-We are not sure what causes your chest pain, but we ruled out a heart attack or pulmonary embolism.  It is possible that it was just a muscle spasm. -You can take some ibuprofen or Tylenol as needed if you continue to have pain. -If you develop worsening pain or shortness of breath, please seek additional medical treatment.  Chest Pain (Nonspecific) It is often hard to give a specific diagnosis for the cause of chest pain. There is always a chance that your pain could be related to something serious, such as a heart attack or a blood clot in the lungs. You need to follow up with your health care provider for further evaluation. CAUSES   Heartburn.  Pneumonia or bronchitis.  Anxiety or stress.  Inflammation around your heart (pericarditis) or lung (pleuritis or pleurisy).  A blood clot in the lung.  A collapsed lung (pneumothorax). It can develop suddenly on its own (spontaneous pneumothorax) or from trauma to the chest.  Shingles infection (herpes zoster virus). The chest wall is composed of bones, muscles, and cartilage. Any of these can be the source of the pain.  The bones can be bruised by injury.  The muscles or cartilage can be strained by coughing or overwork.  The cartilage can be affected by inflammation and become sore (costochondritis). DIAGNOSIS  Lab tests or other studies may be needed to find the cause of your pain. Your health care provider may have you take a test called an ambulatory electrocardiogram (ECG). An ECG records your heartbeat patterns over a 24-hour period. You may also have other tests, such as:  Transthoracic echocardiogram (TTE). During echocardiography, sound waves are used to evaluate how blood flows through your heart.  Transesophageal echocardiogram (TEE).  Cardiac monitoring. This allows your health care provider to monitor your heart rate and rhythm in real time.  Holter monitor. This is a portable device that records your heartbeat and can  help diagnose heart arrhythmias. It allows your health care provider to track your heart activity for several days, if needed.  Stress tests by exercise or by giving medicine that makes the heart beat faster. TREATMENT   Treatment depends on what may be causing your chest pain. Treatment may include:  Acid blockers for heartburn.  Anti-inflammatory medicine.  Pain medicine for inflammatory conditions.  Antibiotics if an infection is present.  You may be advised to change lifestyle habits. This includes stopping smoking and avoiding alcohol, caffeine, and chocolate.  You may be advised to keep your head raised (elevated) when sleeping. This reduces the chance of acid going backward from your stomach into your esophagus. Most of the time, nonspecific chest pain will improve within 2-3 days with rest and mild pain medicine.  HOME CARE INSTRUCTIONS   If antibiotics were prescribed, take them as directed. Finish them even if you start to feel better.  For the next few days, avoid physical activities that bring on chest pain. Continue physical activities as directed.  Do not use any tobacco products, including cigarettes, chewing tobacco, or electronic cigarettes.  Avoid drinking alcohol.  Only take medicine as directed by your health care provider.  Follow your health care provider's suggestions for further testing if your chest pain does not go away.  Keep any follow-up appointments you made. If you do not go to an appointment, you could develop lasting (chronic) problems with pain. If there is any problem keeping an appointment, call to reschedule. SEEK MEDICAL CARE IF:   Your chest pain does  not go away, even after treatment.  You have a rash with blisters on your chest.  You have a fever. SEEK IMMEDIATE MEDICAL CARE IF:   You have increased chest pain or pain that spreads to your arm, neck, jaw, back, or abdomen.  You have shortness of breath.  You have an increasing  cough, or you cough up blood.  You have severe back or abdominal pain.  You feel nauseous or vomit.  You have severe weakness.  You faint.  You have chills. This is an emergency. Do not wait to see if the pain will go away. Get medical help at once. Call your local emergency services (911 in [place] Do not drive yourself to the hospital. MAKE SURE YOU:   Understand these instructions.  Will watch your condition.  Will get help right away if you are not doing well or get worse. Document Released: 02/05/2005 Document Revised: 05/03/2013 Document Reviewed: 12/02/2007 [facility] Patient Information 2015 [place], [place]. This information is not intended to replace advice given to you by your health care provider. Make sure you discuss any questions you have with your health care provider.

## 2014-04-08 NOTE — ED Notes (Signed)
The pt returned from c-t 

## 2014-04-08 NOTE — ED Notes (Signed)
To ct

## 2014-04-08 NOTE — ED Provider Notes (Signed)
CSN: [id number]     Arrival date & time 04/08/14  1311 History   First MD Initiated Contact with Patient 04/08/14 1345     Chief Complaint  Patient presents with  . Chest Pain   HPI Oscar Fuller is a 58 year old man with history of HLD, HTN, and pulmonary embolism in 7/15 now on Xarelto presenting with substernal chest discomfort radiating to the right side of his chest and intermittently to his left neck and shoulder.  He reports working in the yard yesterday and thinks he could have pulled a muscle, but he did not initially have any pain.  This morning at 10 am he developed right, substernal chest tightness that is worse with inspiration but has been constant since that time.  He also reports shortness of breath that is worse when he lies flat.  He says his symptoms are similar to when he had a PE earlier this year.  He reports an occasional cough that is also painful.  He reports left leg swelling since he was diagnosed with a PE, but this has not changed recently.  His previous DVT was diagnoses after flying to [place], but he denies any periods of immobility recently.  He denies hemoptysis, fevers, chills, nausea, or vomiting.   Past Medical History  Diagnosis Date  . Hyperlipidemia   . Increased prostate specific antigen (PSA) velocity     saw Dr Joelyn Oms, s/p Bx(-) 2002  . SCC (squamous cell carcinoma)     Dr Sherrye Payor, in the back  . ED (erectile dysfunction) 07/2008    used some cialis, symptoms not active at present   . Hypertension     was on ACEi-diuretics  . Depression with anxiety   . Atypical chest pain     with ER visit in 8/11. ETT-myoview showed EF 61% no ischemia or infaraction 8/11  . OSA (obstructive sleep apnea) 06/08/2012    HST 05/2012:  AHI 34/hr with desat as low as 69%.   . Prediabetes 09/07/2013  . Pulmonary embolism 11-2013    PE and L DVT after a trip  . DVT (deep venous thrombosis)    Past Surgical History  Procedure Laterality Date  . Vasectomy    . Knee  surgery  2009    left arthroscopy   Family History  Problem Relation Age of Onset  . Colon cancer Neg Hx   . Prostate cancer Neg Hx   . Coronary artery disease Father     stent 66, GF GM  . Heart attack Brother     F B M GM  . Hypertension Father     F B  . Lung cancer Father   . Glaucoma Mother   . Stroke Other     CVA GM  . Stomach cancer Paternal Grandfather   . Gout Brother   . Migraines Sister    History  Substance Use Topics  . Smoking status: Never Smoker   . Smokeless tobacco: Never Used  . Alcohol Use: No     Comment: occassional--rare    Review of Systems  Constitutional: Negative for fever, chills and appetite change.  HENT: Negative for congestion, rhinorrhea and sore throat.   Eyes: Negative for visual disturbance.  Respiratory: Positive for cough, chest tightness and shortness of breath. Negative for wheezing.   Cardiovascular: Positive for chest pain. Negative for palpitations and leg swelling.  Gastrointestinal: Negative for nausea, vomiting, abdominal pain, diarrhea and constipation.  Genitourinary: Negative for dysuria and difficulty  urinating.  Musculoskeletal: Negative for myalgias, back pain and arthralgias.  Skin: Negative for rash.  Neurological: Negative for dizziness, weakness, light-headedness and numbness.      Allergies  Review of patient's allergies indicates no known allergies.  Home Medications   Prior to Admission medications   Medication Sig Start Date End Date Taking? Authorizing Provider  acetaminophen (TYLENOL) 500 MG tablet Take 1,000 mg by mouth every 6 (six) hours as needed (pain).   Yes Historical Provider, MD  atorvastatin (LIPITOR) 40 MG tablet TAKE 1 TABLET DAILY 03/02/14  Yes Colon Branch, MD  rivaroxaban (XARELTO) 20 MG TABS tablet Take 1 tablet (20 mg total) by mouth daily with supper. 01/30/14  Yes Colon Branch, MD  sertraline (ZOLOFT) 100 MG tablet Take 1 tablet (100 mg total) by mouth daily. 02/14/14  Yes Colon Branch, MD   zolpidem (AMBIEN) 10 MG tablet Take 1 tablet (10 mg total) by mouth at bedtime as needed for sleep. 02/14/14  Yes Colon Branch, MD  MOVIPREP 100 G SOLR Take 1 kit (200 g total) by mouth once. 03/28/14   Laban Emperor. Zehr, PA-C  oxybutynin (DITROPAN XL) 10 MG 24 hr tablet Take 1 tablet (10 mg total) by mouth at bedtime. Patient not taking: Reported on 04/08/2014 03/17/14   Colon Branch, MD  solifenacin (VESICARE) 5 MG tablet Take 1 tablet (5 mg total) by mouth daily. Patient not taking: Reported on 04/08/2014 03/13/14   Colon Branch, MD   BP 122/78 mmHg  Pulse 88  Temp(Src) 98.8 F (37.1 C) (Oral)  Resp 18  SpO2 95% Physical Exam  Constitutional: He is oriented to person, place, and time. He appears well-developed and well-nourished. No distress.  Slightly anxious appearing.  HENT:  Head: Normocephalic and atraumatic.  Mouth/Throat: No oropharyngeal exudate.  Eyes: Conjunctivae and EOM are normal. Pupils are equal, round, and reactive to light. No scleral icterus.  Neck: Normal range of motion. Neck supple.  Cardiovascular: Normal rate, regular rhythm and normal heart sounds.   Pulmonary/Chest: Effort normal and breath sounds normal. No respiratory distress.  Abdominal: Soft. Bowel sounds are normal. He exhibits no distension. There is no tenderness.  Musculoskeletal: Normal range of motion. He exhibits no edema or tenderness.  Lymphadenopathy:    He has no cervical adenopathy.  Neurological: He is alert and oriented to person, place, and time. He exhibits normal muscle tone.  Skin: Skin is warm and dry. No rash noted. No erythema.    ED Course  Procedures (including critical care time) Labs Review Labs Reviewed  CBC - Abnormal; Notable for the following:    WBC 13.6 (*)    All other components within normal limits  BASIC METABOLIC PANEL - Abnormal; Notable for the following:    Glucose, Bld 100 (*)    GFR calc non Af Amer 76 (*)    GFR calc Af Amer 88 (*)    All other components  within normal limits  PRO B NATRIURETIC PEPTIDE  I-STAT TROPOININ, ED    Imaging Review Ct Angio Chest Pe W/cm &/or Wo Cm  04/08/2014   CLINICAL DATA:  Substernal chest pain, shortness of breath, cough. History of PE/DVT.  EXAM: CT ANGIOGRAPHY CHEST WITH CONTRAST  TECHNIQUE: Multidetector CT imaging of the chest was performed using the standard protocol during bolus administration of intravenous contrast. Multiplanar CT image reconstructions and MIPs were obtained to evaluate the vascular anatomy.  CONTRAST:  76m OMNIPAQUE IOHEXOL 350 MG/ML SOLN  COMPARISON:  12/09/2013  FINDINGS: No evidence of pulmonary embolism.  Mild focal/patchy ground-glass opacity in the right lung apex (series 6/ image 22), possibly scarring/atelectasis, less likely infection. Mild patchy opacities in the bilateral lower lobes, likely atelectasis. No pleural effusion or pneumothorax.  Visualized thyroid is unremarkable.  The heart is top-normal in size.  No pericardial effusion.  No suspicious mediastinal, hilar, or axillary lymphadenopathy.  Visualized upper abdomen is unremarkable.  Degenerative changes of the visualized thoracolumbar spine.  Review of the MIP images confirms the above findings.  IMPRESSION: No evidence of pulmonary embolism.  Focal patchy/ ground-glass opacity in the right lung apex, likely scarring/ atelectasis, less likely infection.  Mild patchy opacities in the bilateral lower lobes, likely atelectasis.   Electronically Signed   By: Julian Hy M.D.   On: 04/08/2014 16:08     EKG Interpretation   Date/Time:  Saturday April 08 2014 13:18:58 EST Ventricular Rate:  77 PR Interval:  156 QRS Duration: 86 QT Interval:  376 QTC Calculation: 425 R Axis:   27 Text Interpretation:  Normal sinus rhythm Normal ECG Confirmed by BEATON   MD, ROBERT ([id number]) on 04/08/2014 1:43:56 PM      MDM   Final diagnoses:  Chest pain, unspecified chest pain type   Concern for recurrent PE with symptoms  similar to previous presentation (Wells 4.5, moderate risk), will check CTA.  EKG and troponin negative.  4:21 pm: CTA negative for PE. Patient feeling much better, will discharge home.  Arman Filter, MD 04/08/14 1621  Dot Lanes, MD 04/08/14 [phone]

## 2014-04-08 NOTE — ED Notes (Addendum)
Pt in xray at this time. Family at bedside with no needs.

## 2014-04-08 NOTE — ED Notes (Signed)
The pt just  Returned from c-t minimal pain.     Waiting on c-t rersults

## 2014-04-08 NOTE — ED Notes (Signed)
Pt reports onset this am of mid upper chest discomfort, radiating into his left shoulder. Pt was working in yard yesterday, unsure if he pulled a muscle. Pain increases with movement and talking. ekg done at triage. Pt has recent hx of PE and is taking xarelto.

## 2014-04-08 NOTE — ED Notes (Signed)
Minor chest discomfort alert oriented skin warm and  Dry.

## 2014-04-08 NOTE — ED Notes (Signed)
Ready to have his c-t chest hx of pes

## 2014-04-19 ENCOUNTER — Encounter: Payer: Self-pay | Admitting: Gastroenterology

## 2014-05-09 ENCOUNTER — Encounter: Payer: Self-pay | Admitting: Gastroenterology

## 2014-05-09 ENCOUNTER — Ambulatory Visit (AMBULATORY_SURGERY_CENTER): Payer: BC Managed Care – PPO | Admitting: Gastroenterology

## 2014-05-09 VITALS — BP 105/75 | HR 67 | Temp 98.1°F | Resp 16 | Ht 68.0 in | Wt 249.0 lb

## 2014-05-09 DIAGNOSIS — Z86010 Personal history of colonic polyps: Secondary | ICD-10-CM

## 2014-05-09 DIAGNOSIS — D122 Benign neoplasm of ascending colon: Secondary | ICD-10-CM

## 2014-05-09 MED ORDER — SODIUM CHLORIDE 0.9 % IV SOLN: 500.0000 mL | INTRAVENOUS | Status: DC

## 2014-05-09 NOTE — Progress Notes (Signed)
Procedure ends, to recovery, report given and VSS. 

## 2014-05-09 NOTE — Progress Notes (Signed)
Called to room to assist during endoscopic procedure.  Patient ID and intended procedure confirmed with present staff. Received instructions for my participation in the procedure from the performing physician.  

## 2014-05-09 NOTE — Op Note (Signed)
[place]  [address] [place], [id number]   COLONOSCOPY PROCEDURE REPORT  PATIENT: Oscar, Fuller  MR#: [id number] BIRTHDATE: December 15, 1955 , 58  yrs. old GENDER: male ENDOSCOPIST: Ladene Artist, MD, [facility] PROCEDURE DATE:  05/09/2014 PROCEDURE:   Colonoscopy with biopsy First Screening Colonoscopy - Avg.  risk and is 50 yrs.  old or older - No.  Prior Negative Screening - Now for repeat screening. N/A  History of Adenoma - Now for follow-up colonoscopy & has been > or = to 3 yrs.  Yes hx of adenoma.  Has been 3 or more years since last colonoscopy.  Polyps Removed Today? Yes. ASA CLASS:   Class III INDICATIONS:surveillance colonoscopy based on a history of adenomatous colonic polyp(s). MEDICATIONS: Monitored anesthesia care and Propofol 270 mg IV DESCRIPTION OF PROCEDURE:   After the risks benefits and alternatives of the procedure were thoroughly explained, informed consent was obtained.  The digital rectal exam revealed no abnormalities of the rectum.   The LB [id number]  endoscope was introduced through the anus and advanced to the cecum, which was identified by both the appendix and ileocecal valve. No adverse events experienced.   The quality of the prep was adequate, using MoviPrep  The instrument was then slowly withdrawn as the colon was fully examined.  COLON FINDINGS: A sessile polyp measuring 4 mm in size was found in the ascending colon.  A polypectomy was performed with cold forceps.  The resection was complete, the polyp tissue was completely retrieved and sent to histology.   The examination was otherwise normal.  Retroflexed views revealed internal Grade I hemorrhoids. The time to cecum=2 minutes 04 seconds.  Withdrawal time=10 minutes 15 seconds.  The scope was withdrawn and the procedure completed. COMPLICATIONS: There were no immediate complications.  ENDOSCOPIC IMPRESSION: 1.   Sessile polyp in the ascending colon; polypectomy  performed with cold forceps 2.   Grade l internal hemorrhoids  RECOMMENDATIONS: 1.  Hold Aspirin and all other NSAIDS for 2 weeks. 2.  Await pathology results 3.  Repeat Colonoscopy in 5 years. 4.  Resume Xarelto in 2 days  eSigned:  Ladene Artist, MD, [facility] 05/09/2014 2:04 PM

## 2014-05-09 NOTE — Patient Instructions (Signed)
YOU HAD AN ENDOSCOPIC PROCEDURE TODAY AT THE [facility]: Refer to the procedure report that was given to you for any specific questions about what was found during the examination.  If the procedure report does not answer your questions, please call your gastroenterologist to clarify.  If you requested that your care partner not be given the details of your procedure findings, then the procedure report has been included in a sealed envelope for you to review at your convenience later.  YOU SHOULD EXPECT: Some feelings of bloating in the abdomen. Passage of more gas than usual.  Walking can help get rid of the air that was put into your GI tract during the procedure and reduce the bloating. If you had a lower endoscopy (such as a colonoscopy or flexible sigmoidoscopy) you may notice spotting of blood in your stool or on the toilet paper. If you underwent a bowel prep for your procedure, then you may not have a normal bowel movement for a few days.  DIET: Your first meal following the procedure should be a light meal and then it is ok to progress to your normal diet.  A half-sandwich or bowl of soup is an example of a good first meal.  Heavy or fried foods are harder to digest and may make you feel nauseous or bloated.  Likewise meals heavy in dairy and vegetables can cause extra gas to form and this can also increase the bloating.  Drink plenty of fluids but you should avoid alcoholic beverages for 24 hours.  ACTIVITY: Your care partner should take you home directly after the procedure.  You should plan to take it easy, moving slowly for the rest of the day.  You can resume normal activity the day after the procedure however you should NOT DRIVE or use heavy machinery for 24 hours (because of the sedation medicines used during the test).    SYMPTOMS TO REPORT IMMEDIATELY: A gastroenterologist can be reached at any hour.  During normal business hours, 8:30 AM to 5:00 PM Monday through Friday,  call [phone].  After hours and on weekends, please call the GI answering service at [phone] who will take a message and have the physician on call contact you.   Following lower endoscopy (colonoscopy or flexible sigmoidoscopy):  Excessive amounts of blood in the stool  Significant tenderness or worsening of abdominal pains  Swelling of the abdomen that is new, acute  Fever of 100F or higher  FOLLOW UP: If any biopsies were taken you will be contacted by phone or by letter within the next 1-3 weeks.  Call your gastroenterologist if you have not heard about the biopsies in 3 weeks.  Our staff will call the home number listed on your records the next business day following your procedure to check on you and address any questions or concerns that you may have at that time regarding the information given to you following your procedure. This is a courtesy call and so if there is no answer at the home number and we have not heard from you through the emergency physician on call, we will assume that you have returned to your regular daily activities without incident.  SIGNATURES/CONFIDENTIALITY: You and/or your care partner have signed paperwork which will be entered into your electronic medical record.  These signatures attest to the fact that that the information above on your After Visit Summary has been reviewed and is understood.  Full responsibility of the confidentiality of this   discharge information lies with you and/or your care-partner.  Await pathology  Restart Xarelto in 2 days  NO ASPIRIN, ASPIRIN CONTAINING PRODUCTS (GOODY OR BC POWDERS), NSAIDS (ADVIL, ALEVE, MOTRIN, IBUPROFEN) FOR 2 WEEKS- TYLENOL IS OK  Next colonoscopy in 5 years  Please read over handouts about polyps, hemorrhoids, and high fiber diets

## 2014-05-10 ENCOUNTER — Telehealth: Payer: Self-pay

## 2014-05-10 NOTE — Telephone Encounter (Signed)
  Follow up Call-  Call back number 05/09/2014  Post procedure Call Back phone  # [phone]  Permission to leave phone message Yes     Patient questions:  Do you have a fever, pain , or abdominal swelling? No. Pain Score  0 *  Have you tolerated food without any problems? Yes.    Have you been able to return to your normal activities? Yes.    Do you have any questions about your discharge instructions: Diet   No. Medications  No. Follow up visit  No.  Do you have questions or concerns about your Care? No.  Actions: * If pain score is 4 or above: No action needed, pain <4.  No problems per the pt. maw

## 2014-05-17 ENCOUNTER — Encounter: Payer: Self-pay | Admitting: Gastroenterology

## 2014-06-27 ENCOUNTER — Encounter: Payer: Self-pay | Admitting: Internal Medicine

## 2014-06-27 ENCOUNTER — Ambulatory Visit (HOSPITAL_BASED_OUTPATIENT_CLINIC_OR_DEPARTMENT_OTHER)
Admission: RE | Admit: 2014-06-27 | Discharge: 2014-06-27 | Disposition: A | Discharge status: Home / Self Care | Payer: BLUE CROSS/BLUE SHIELD | Source: Ambulatory Visit | Attending: Internal Medicine | Admitting: Internal Medicine

## 2014-06-27 ENCOUNTER — Ambulatory Visit (INDEPENDENT_AMBULATORY_CARE_PROVIDER_SITE_OTHER): Payer: BLUE CROSS/BLUE SHIELD | Admitting: Internal Medicine

## 2014-06-27 VITALS — BP 108/62 | HR 70 | Temp 98.0°F | Ht 68.0 in | Wt 251.5 lb

## 2014-06-27 DIAGNOSIS — W19XXXA Unspecified fall, initial encounter: Secondary | ICD-10-CM | POA: Insufficient documentation

## 2014-06-27 DIAGNOSIS — S20212A Contusion of left front wall of thorax, initial encounter: Secondary | ICD-10-CM

## 2014-06-27 DIAGNOSIS — S2242XA Multiple fractures of ribs, left side, initial encounter for closed fracture: Secondary | ICD-10-CM | POA: Insufficient documentation

## 2014-06-27 DIAGNOSIS — R0781 Pleurodynia: Secondary | ICD-10-CM | POA: Diagnosis present

## 2014-06-27 MED ORDER — KETOROLAC TROMETHAMINE 10 MG PO TABS: 10.0000 mg | ORAL_TABLET | Freq: Four times a day (QID) | ORAL | Status: DC | PRN

## 2014-06-27 MED ORDER — HYDROCODONE-ACETAMINOPHEN 7.5-300 MG PO TABS: 1.0000 | ORAL_TABLET | Freq: Three times a day (TID) | ORAL | Status: DC | PRN

## 2014-06-27 NOTE — Progress Notes (Signed)
Pre visit review using our clinic review tool, if applicable. No additional management support is needed unless otherwise documented below in the visit note. 

## 2014-06-27 NOTE — Patient Instructions (Signed)
Get the x-ray and the first floor  Take Toradol as neede for pain ; take with food to prevent stomach irritation, if you have any stomach problems, nausea, change in the color of stools: Stop toradol and let me know.  Take hydrocodone if the pain persists or is intense. Will cause drowsiness  Call if you are not gradually better in the next 2 weeks

## 2014-06-27 NOTE — Progress Notes (Signed)
Subjective:    Patient ID: Oscar Fuller, male    DOB: [birth year], 59 y.o.   MRN: [id number]  DOS:  06/27/2014 Type of visit - description : acute Interval history: Yesterday at around 3 PM he was shoveling snow at home, slipped and fell straight on his back. The pain is started a few hours later, is intense and located at the left-post  chest. Worse when he bends, laugh or cough. Standing does not cause pain. Has not taking meds for pain   Review of Systems Denies direct injury to the head to neck. No headache or neck pain No gross hematuria No bladder or bowel incontinence  Past Medical History  Diagnosis Date  . Hyperlipidemia   . Increased prostate specific antigen (PSA) velocity     saw Dr Joelyn Oms, s/p Bx(-) 2002  . SCC (squamous cell carcinoma)     Dr Sherrye Payor, in the back  . ED (erectile dysfunction) 07/2008    used some cialis, symptoms not active at present   . Hypertension     was on ACEi-diuretics  . Depression with anxiety   . Atypical chest pain     with ER visit in 8/11. ETT-myoview showed EF 61% no ischemia or infaraction 8/11  . OSA (obstructive sleep apnea) 06/08/2012    HST 05/2012:  AHI 34/hr with desat as low as 69%.   . Prediabetes 09/07/2013  . Pulmonary embolism 11-2013    PE and L DVT after a trip  . DVT (deep venous thrombosis)   . Sleep apnea     Past Surgical History  Procedure Laterality Date  . Vasectomy    . Knee surgery  2009    left arthroscopy    History   Social History  . Marital Status: Married    Spouse Name: N/A  . Number of Children: 2  . Years of Education: BS   Occupational History  . [employer]    Social History Main Topics  . Smoking status: Never Smoker   . Smokeless tobacco: Never Used  . Alcohol Use: No     Comment: occassional--rare  . Drug Use: No  . Sexual Activity: Not on file   Other Topics Concern  . Not on file   Social History Narrative   Married x 2  , 2nd wife [name] has a h/o SZ d/o               Medication List       This list is accurate as of: 06/27/14  8:04 PM.  Always use your most recent med list.               acetaminophen 500 MG tablet  Commonly known as:  TYLENOL  Take 1,000 mg by mouth every 6 (six) hours as needed (pain).     atorvastatin 40 MG tablet  Commonly known as:  LIPITOR  TAKE 1 TABLET DAILY     Hydrocodone-Acetaminophen 7.5-300 MG Tabs  Take 1 tablet by mouth 3 (three) times daily as needed.     ketorolac 10 MG tablet  Commonly known as:  TORADOL  Take 1 tablet (10 mg total) by mouth every 6 (six) hours as needed.     sertraline 100 MG tablet  Commonly known as:  ZOLOFT  Take 1 tablet (100 mg total) by mouth daily.     zolpidem 10 MG tablet  Commonly known as:  AMBIEN  Take 1 tablet (10 mg total) by mouth at bedtime as  needed for sleep.           Objective:   Physical Exam  Musculoskeletal:       Arms:  BP 108/62 mmHg  Pulse 70  Temp(Src) 98 F (36.7 C) (Oral)  Ht 5\' 8"  (1.727 m)  Wt 251 lb 8 oz (114.08 kg)  BMI 38.25 kg/m2  SpO2 97% General:   Well developed, well nourished .  Antalgic posture and gait noted HEENT:  Normocephalic . Face symmetric, atraumatic Lungs:  CTA B Normal respiratory effort, no intercostal retractions, no accessory muscle use. Heart: RRR,  no murmur.  Muscle skeletal: no pretibial edema bilaterally   No TTP at the cervical, thoracic or lumbar spine. See graphic Neurologic:  alert & oriented X3.  Speech normal, gait  unassisted Psych--  Cognition and judgment appear intact.  Cooperative with normal attention span and concentration.  Behavior appropriate. No anxious or depressed appearing.        Assessment & Plan:   Status post a fall, chest concussion. Suspect the patient has a chest contusion, fortunately he is not taking Xarelto any longer. No head or neck injury. Plan: Rib x-ray   Toradol, GI precautions discussed. Also  hydrocodone as needed, discuss drowsiness. See   instructions finally I do recommend issometric leg exercises and try to stay active despite the pain to prevent clots

## 2014-06-28 ENCOUNTER — Encounter: Payer: Self-pay | Admitting: Internal Medicine

## 2014-07-04 ENCOUNTER — Telehealth: Payer: Self-pay

## 2014-07-04 ENCOUNTER — Other Ambulatory Visit: Payer: Self-pay

## 2014-07-04 NOTE — Telephone Encounter (Signed)
-----   Message from Colon Branch, MD sent at 06/27/2014  8:06 PM EST ----- Regarding: Call the patient Open a phone note, was seen a week ago after a fall, has a rib fracture, please check on the patient

## 2014-07-04 NOTE — Telephone Encounter (Signed)
Spoke with Bailey Mech, she informed me that Pt is still in a lot of pain. Pt has returned to work, which he travels for work and is trying not to use Hydrocodone during the day due to drowsiness but has been taking at night with some relief. Bailey Mech mentioned that Dr. Larose Kells and Pt discussed the possibility of using the pain clinic via MyChart if needed. Tried to inform Bailey Mech the usage of Pain Clinic but still has some questions. Informed her I would let Dr. Larose Kells know of Pt's pain and if he would possibly call Robyn and Pt to discuss Pain Clinic if possible. Informed her that I would inform Dr. Larose Kells.

## 2014-07-04 NOTE — Telephone Encounter (Signed)
Spoke with the patient, he told me he had a hard time during the weekend but now is gradually improving , most of the pain is now  at night for which he is taking hydrocodone. We agreed he will call me if something else is needed

## 2014-07-06 ENCOUNTER — Other Ambulatory Visit: Payer: Self-pay | Admitting: Internal Medicine

## 2014-07-06 NOTE — Telephone Encounter (Signed)
Pt requesting refill on Xarelto, no longer on current med list. Should he still be taking? Please advise.

## 2014-07-06 NOTE — Telephone Encounter (Signed)
No , xarelto was d/c

## 2014-07-14 ENCOUNTER — Telehealth: Payer: Self-pay | Admitting: Internal Medicine

## 2014-07-14 MED ORDER — HYDROCODONE-ACETAMINOPHEN 7.5-300 MG PO TABS: 1.0000 | ORAL_TABLET | Freq: Three times a day (TID) | ORAL | Status: DC | PRN

## 2014-07-14 NOTE — Telephone Encounter (Signed)
Caller name: Earll, Dayvon Dax Relation to pt: self  Call back number: [phone] Pharmacy: [id number] - [place], [place] - [address] [phone] (Phone) [phone] (Fax)         Reason for call:  Pt requesting ketorolac (TORADOL) 10 MG tablet and Hydrocodone-Acetaminophen 7.5-300 MG TABS

## 2014-07-14 NOTE — Telephone Encounter (Signed)
Rx printed, awaiting signature by Dr. Paz.  

## 2014-07-14 NOTE — Telephone Encounter (Signed)
Okay to refill hydrocortisone # 40 Recommend not to take Toradol anymore, okay to take ibuprofen OTC

## 2014-07-14 NOTE — Telephone Encounter (Signed)
Pt is requesting refill on Hydrocodone and Toradol.  Last OV: 06/27/2014 Last Fill on Hydrocodone # 30 0RF Last Fill on Toradol #20 0RF UDS: 09/09/2013 Low risk for Ambien only  Please advise.

## 2014-07-14 NOTE — Telephone Encounter (Signed)
Addendum: Hydrocodone not Hydrocortisone

## 2014-07-17 NOTE — Telephone Encounter (Signed)
LMOM informing Pt that Hydrocodone is ready for pick up at front desk. Informed him Dr. Larose Kells recommends using Hydrocodone and OTC Ibuprofen for pain instead of continuing the Toradol. Informed him to call office if he has any questions.

## 2014-08-08 ENCOUNTER — Other Ambulatory Visit: Payer: Self-pay | Admitting: Internal Medicine

## 2014-08-11 ENCOUNTER — Ambulatory Visit: Payer: BLUE CROSS/BLUE SHIELD | Admitting: Pulmonary Disease

## 2014-08-24 ENCOUNTER — Ambulatory Visit (INDEPENDENT_AMBULATORY_CARE_PROVIDER_SITE_OTHER): Payer: BLUE CROSS/BLUE SHIELD | Admitting: Pulmonary Disease

## 2014-08-24 ENCOUNTER — Encounter: Payer: Self-pay | Admitting: Pulmonary Disease

## 2014-08-24 VITALS — BP 110/80 | HR 70 | Temp 97.9°F | Ht 69.0 in | Wt 250.6 lb

## 2014-08-24 DIAGNOSIS — G4733 Obstructive sleep apnea (adult) (pediatric): Secondary | ICD-10-CM | POA: Diagnosis not present

## 2014-08-24 NOTE — Progress Notes (Signed)
   Subjective:    Patient ID: Oscar Fuller, male    DOB: 1955-08-20, 59 y.o.   MRN: [id number]  HPI The patient comes in today for follow-up of his obstructive sleep apnea. CPAP compliantly, and is having no issues with his mask fit or pressure. He is behind on his supplies, and will need a new order sent to his home care company. He does want to try nasal pillows. He feels that he sleeps well with his device, and is satisfied with his daytime alertness.   Review of Systems  Constitutional: Negative for fever and unexpected weight change.  HENT: Negative for congestion, dental problem, ear pain, nosebleeds, postnasal drip, rhinorrhea, sinus pressure, sneezing, sore throat and trouble swallowing.   Eyes: Negative for redness and itching.  Respiratory: Negative for cough, chest tightness, shortness of breath and wheezing.   Cardiovascular: Negative for palpitations and leg swelling.  Gastrointestinal: Negative for nausea and vomiting.  Genitourinary: Negative for dysuria.  Musculoskeletal: Negative for joint swelling.  Skin: Negative for rash.  Neurological: Negative for headaches.  Hematological: Does not bruise/bleed easily.  Psychiatric/Behavioral: Negative for dysphoric mood. The patient is nervous/anxious.        Objective:   Physical Exam Obese male in no acute distress Nose without purulence or discharge noted Neck without lymphadenopathy or thyromegaly No skin breakdown or pressure necrosis from the C Pap mask Lower extremities without edema, no cyanosis Alert and oriented, moves all 4 extremities.       Assessment & Plan:

## 2014-08-24 NOTE — Patient Instructions (Signed)
Will send an order to your home care company for new supplies. Stay on cpap, and work on weight loss followup again in one year

## 2014-08-24 NOTE — Assessment & Plan Note (Signed)
The patient is doing very well with C Pap, but is in need of new supplies. He is sleeping well with his device, and is satisfied with his daytime alertness. Asked him to stay on his C Pap, and to work aggressively on weight loss.

## 2014-09-04 ENCOUNTER — Encounter: Payer: Self-pay | Admitting: Internal Medicine

## 2014-09-04 ENCOUNTER — Ambulatory Visit (INDEPENDENT_AMBULATORY_CARE_PROVIDER_SITE_OTHER): Payer: BLUE CROSS/BLUE SHIELD | Admitting: Internal Medicine

## 2014-09-04 VITALS — BP 122/68 | HR 72 | Temp 97.7°F | Ht 69.0 in | Wt 245.1 lb

## 2014-09-04 DIAGNOSIS — R079 Chest pain, unspecified: Secondary | ICD-10-CM

## 2014-09-04 NOTE — Progress Notes (Signed)
Subjective:    Patient ID: Oscar Fuller, male    DOB: [birth year], 59 y.o.   MRN: [id number]  DOS:  09/04/2014 Type of visit - description : acute Interval history: Patient is concerned because this morning noted left-sided distal chest pain when he takes a deep breath ("smilar to when i had a clot"), last a few seconds, no exertional symptoms.  He has a history of PE and in addition is flying to [place]  in 4 days, quite concerned about symptoms representing another clot.    Review of Systems  Denies fever chills No difficulty breathing, lower extremity edema or palpitations No dyspnea on exertion He does have some left ankle swelling (chronic) No recent trips   Past Medical History  Diagnosis Date  . Hyperlipidemia   . Increased prostate specific antigen (PSA) velocity     saw Dr Joelyn Oms, s/p Bx(-) 2002  . SCC (squamous cell carcinoma)     Dr Sherrye Payor, in the back  . ED (erectile dysfunction) 07/2008    used some cialis, symptoms not active at present   . Hypertension     was on ACEi-diuretics  . Depression with anxiety   . Atypical chest pain     with ER visit in 8/11. ETT-myoview showed EF 61% no ischemia or infaraction 8/11  . OSA (obstructive sleep apnea) 06/08/2012    HST 05/2012:  AHI 34/hr with desat as low as 69%.   . Prediabetes 09/07/2013  . Pulmonary embolism 11-2013    PE and L DVT after a trip  . DVT (deep venous thrombosis)   . Sleep apnea     Past Surgical History  Procedure Laterality Date  . Vasectomy    . Knee surgery  2009    left arthroscopy    History   Social History  . Marital Status: Married    Spouse Name: N/A  . Number of Children: 2  . Years of Education: BS   Occupational History  . [employer]    Social History Main Topics  . Smoking status: Never Smoker   . Smokeless tobacco: Never Used  . Alcohol Use: No     Comment: occassional--rare  . Drug Use: No  . Sexual Activity: Not on file   Other Topics Concern  . Not on file    Social History Narrative   Married x 2  , 2nd wife [name] has a h/o SZ d/o               Medication List       This list is accurate as of: 09/04/14 11:59 PM.  Always use your most recent med list.               acetaminophen 500 MG tablet  Commonly known as:  TYLENOL  Take 1,000 mg by mouth every 6 (six) hours as needed (pain).     atorvastatin 40 MG tablet  Commonly known as:  LIPITOR  Take 1 tablet (40 mg total) by mouth daily.     sertraline 100 MG tablet  Commonly known as:  ZOLOFT  Take 1 tablet (100 mg total) by mouth daily.     zolpidem 10 MG tablet  Commonly known as:  AMBIEN  Take 1 tablet (10 mg total) by mouth at bedtime as needed for sleep.           Objective:   Physical Exam BP 122/68 mmHg  Pulse 72  Temp(Src) 97.7 F (36.5 C) (Oral)  Ht  5\' 9"  (1.753 m)  Wt 245 lb 2 oz (111.188 kg)  BMI 36.18 kg/m2  SpO2 99%  General:   Well developed, well nourished . NAD.  HEENT:  Normocephalic . Face symmetric, atraumatic Lungs:  CTA B Normal respiratory effort, no intercostal retractions, no accessory muscle use. Heart: RRR,  no murmur.  Muscle skeletal: no pretibial edema bilaterally  Skin: Not pale. Not jaundice Neurologic:  alert & oriented X3.  Speech normal, gait appropriate for age and unassisted Psych--  Cognition and judgment appear intact.  Cooperative with normal attention span and concentration.  Behavior appropriate. No anxious or depressed appearing.       Assessment & Plan:    Chest pain, Mild (2/10) pleuritic chest pain, patient is extremely concerned about these represent a PE. We discussed the following -  CT chest today -do a d-dimer as screening - do nothing He really would like to do something so we elected to do a d-dimer. If it is negative his chances for his pain to represent a PE and verysmalll. Plan: D-dimer, further advice would results.  Additionally he is flying to [place], quite concerned about that as  well. I recommend aspirin 325 mg daily starting today, frequent walks during the airplane trip, use compression stockings and good hydration. Care was discussed with one of the hematologists, he agreed with my advice. The patient is very aware of how to recognize symptoms of clots. Marland Kitchen

## 2014-09-04 NOTE — Progress Notes (Signed)
Pre visit review using our clinic review tool, if applicable. No additional management support is needed unless otherwise documented below in the visit note. 

## 2014-09-04 NOTE — Patient Instructions (Signed)
Please do the blood work  To prevent clots take aspirin daily, move  frequently during the airplane trip, use compression stockings

## 2014-09-05 ENCOUNTER — Telehealth: Payer: Self-pay | Admitting: Internal Medicine

## 2014-09-05 ENCOUNTER — Ambulatory Visit (HOSPITAL_BASED_OUTPATIENT_CLINIC_OR_DEPARTMENT_OTHER)
Admission: RE | Admit: 2014-09-05 | Discharge: 2014-09-05 | Disposition: A | Discharge status: Home / Self Care | Payer: BLUE CROSS/BLUE SHIELD | Source: Ambulatory Visit | Attending: Internal Medicine | Admitting: Internal Medicine

## 2014-09-05 ENCOUNTER — Encounter (HOSPITAL_BASED_OUTPATIENT_CLINIC_OR_DEPARTMENT_OTHER): Payer: Self-pay

## 2014-09-05 DIAGNOSIS — Z86711 Personal history of pulmonary embolism: Secondary | ICD-10-CM | POA: Insufficient documentation

## 2014-09-05 DIAGNOSIS — R079 Chest pain, unspecified: Secondary | ICD-10-CM | POA: Insufficient documentation

## 2014-09-05 LAB — BASIC METABOLIC PANEL
BUN: 13 mg/dL (ref 6–23)
CO2: 27 meq/L (ref 19–32)
CREATININE: 1.05 mg/dL (ref 0.40–1.50)
Calcium: 9.4 mg/dL (ref 8.4–10.5)
Chloride: 109 mEq/L (ref 96–112)
GFR: 77.41 mL/min (ref >60.00)
Glucose, Bld: 81 mg/dL (ref 70–99)
Potassium: 4.1 mEq/L (ref 3.5–5.1)
SODIUM: 143 meq/L (ref 135–145)

## 2014-09-05 LAB — D-DIMER, QUANTITATIVE (NOT AT ARMC): D DIMER QUANT: 0.51 ug{FEU}/mL — AB (ref 0.00–0.48)

## 2014-09-05 MED ORDER — IOHEXOL 350 MG/ML SOLN
100.0000 mL | Freq: Once | INTRAVENOUS | Status: AC | PRN
  Administered 2014-09-05: 100 mL via INTRAVENOUS

## 2014-09-05 NOTE — Telephone Encounter (Signed)
Advise patient, d-dimer slightly positive, will proceed with a CT chest to rule out PE.

## 2014-09-05 NOTE — Telephone Encounter (Signed)
Pt has appt today (4/26) at 1400 for CT.

## 2014-09-05 NOTE — Telephone Encounter (Signed)
CT negative, patient aware.

## 2014-09-11 ENCOUNTER — Ambulatory Visit: Payer: BLUE CROSS/BLUE SHIELD | Admitting: Internal Medicine

## 2014-10-19 ENCOUNTER — Other Ambulatory Visit: Payer: Self-pay | Admitting: Internal Medicine

## 2015-01-01 IMAGING — CR DG HIP COMPLETE 2+V*R*
3 series · 3 of 3 positions shown · non-contrast
Comparison: None.

CLINICAL DATA: Injured right hip 1 month ago with pain

EXAM:
RIGHT HIP - COMPLETE 2+ VIEW

[view not recorded (1 of 3)]
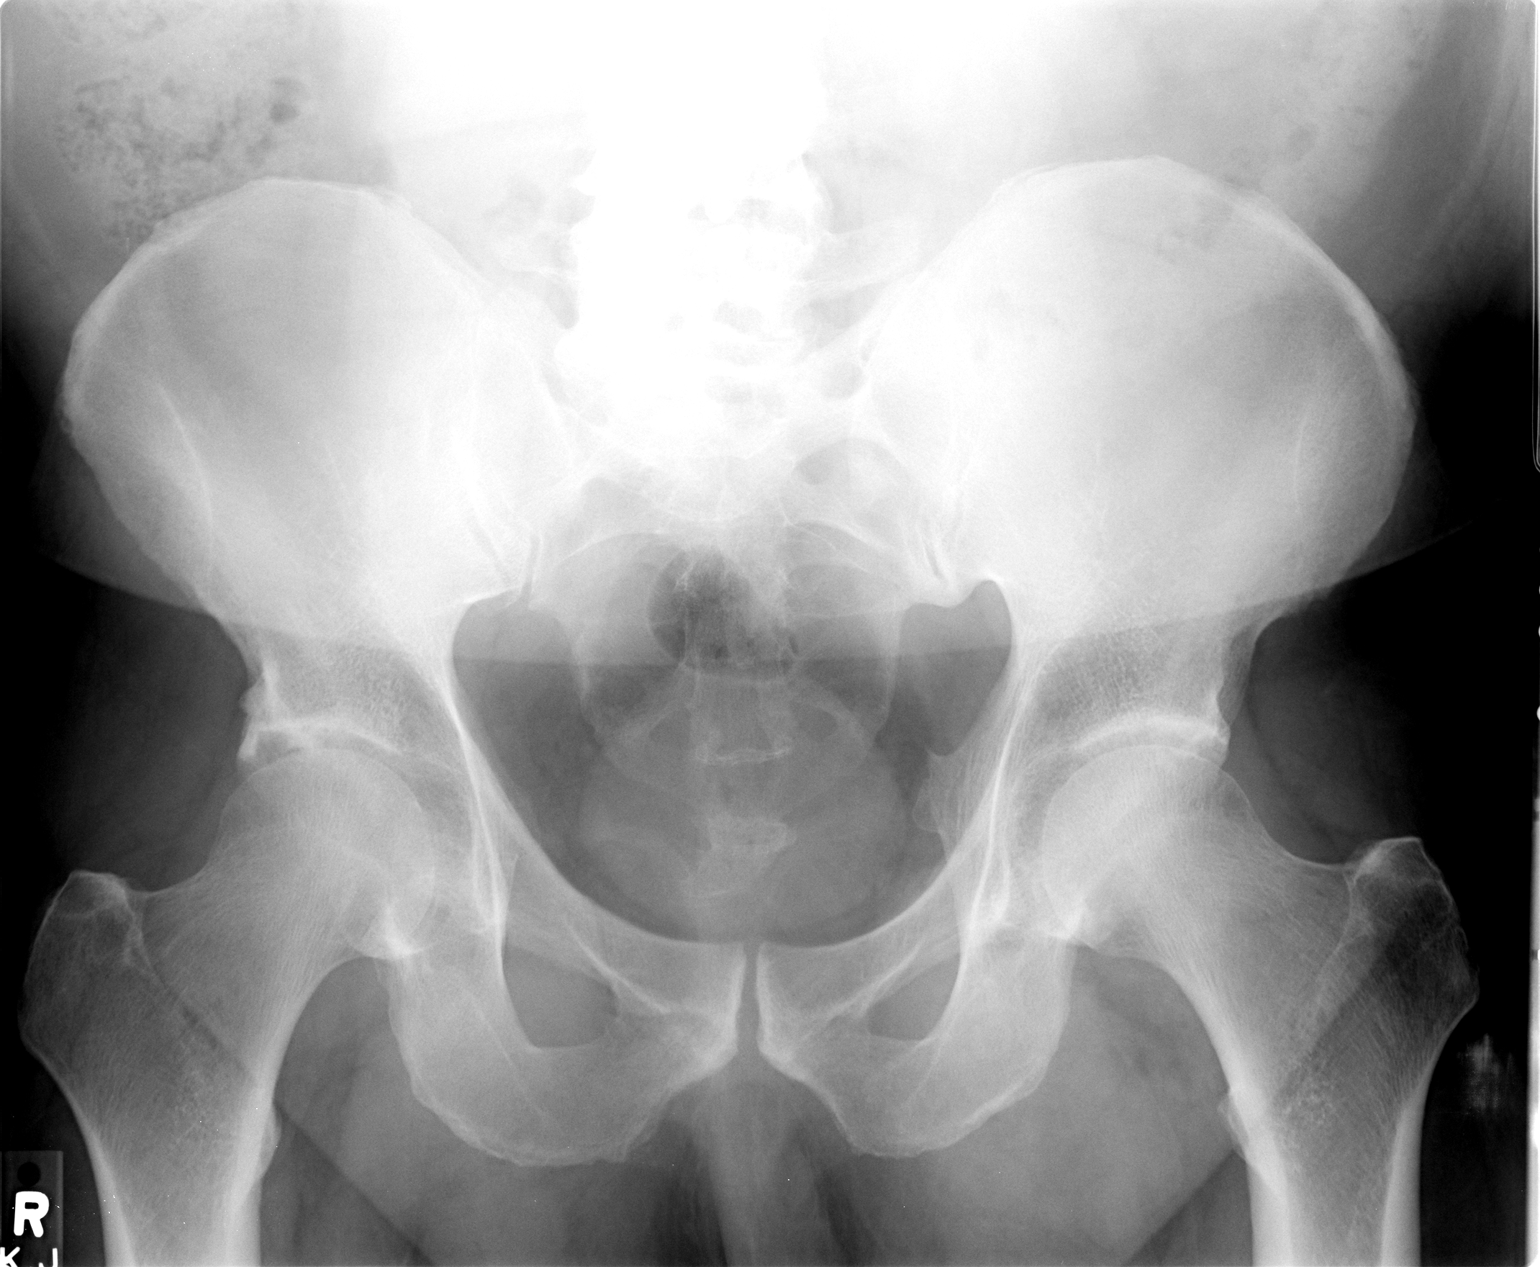

[view not recorded (2 of 3)]
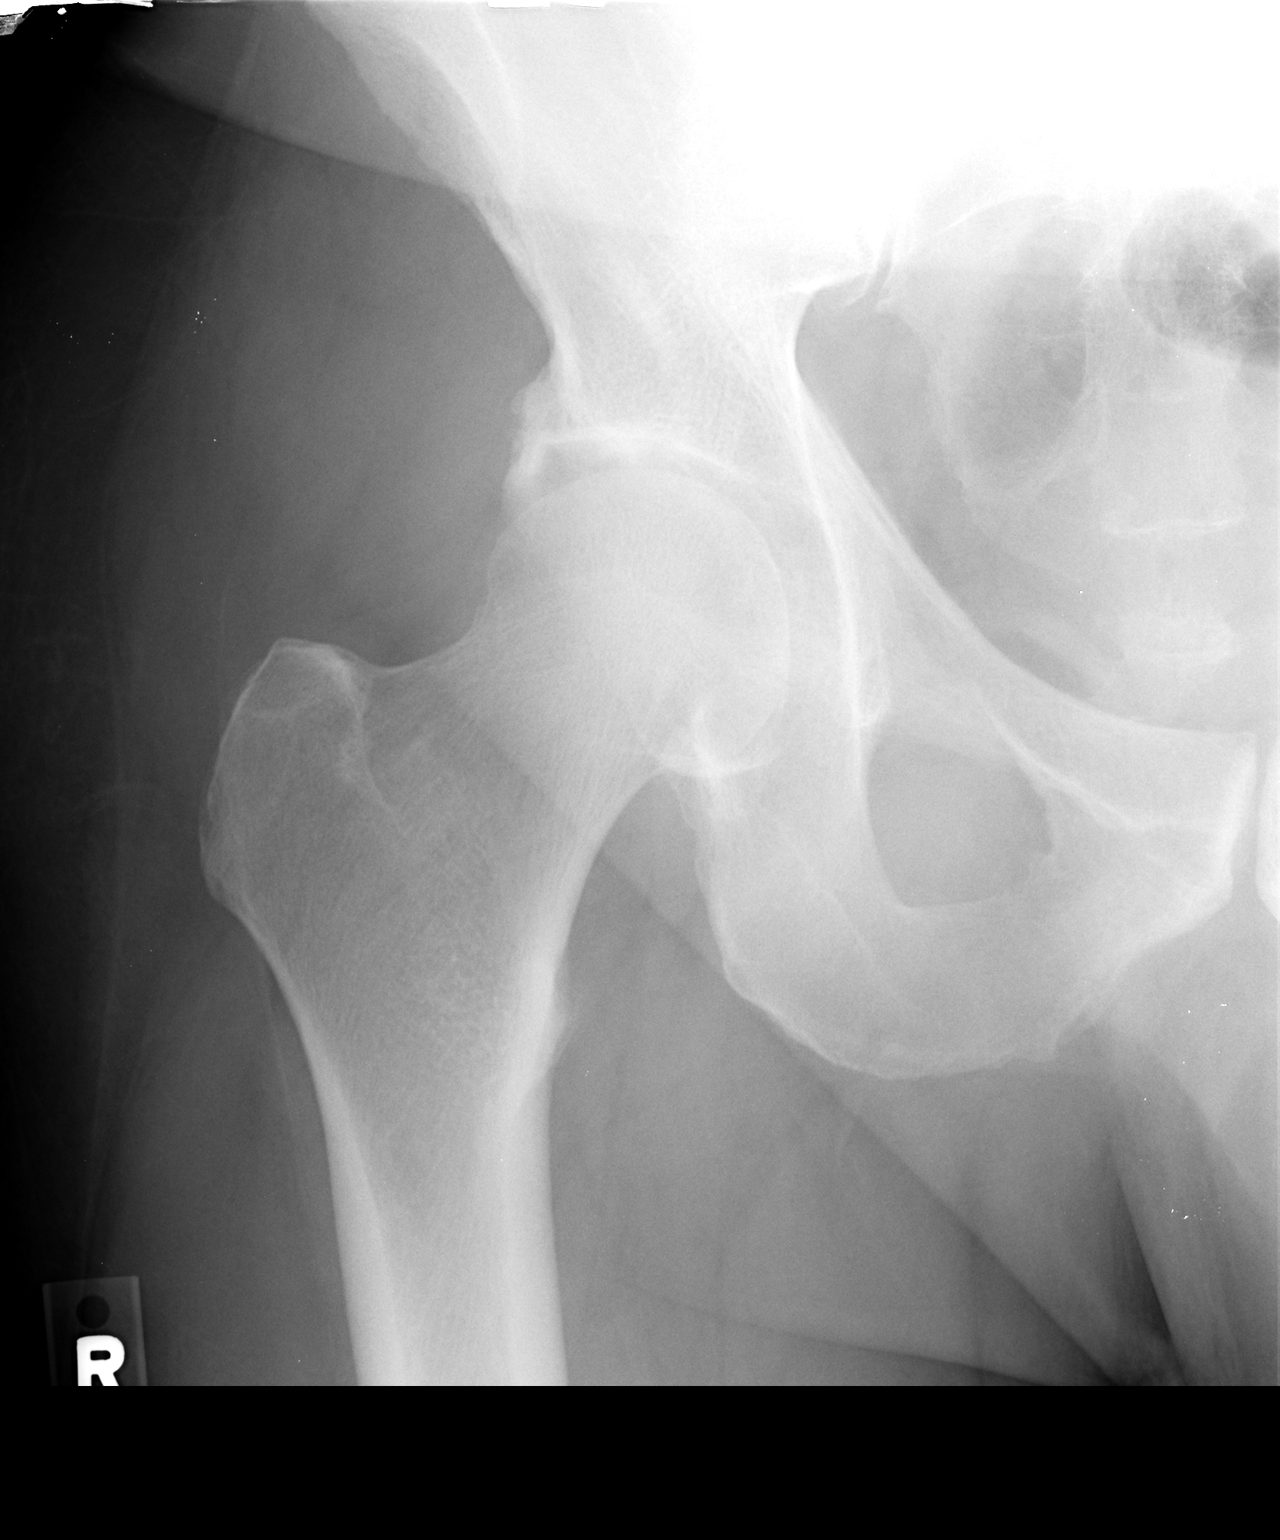

[view not recorded (3 of 3)]
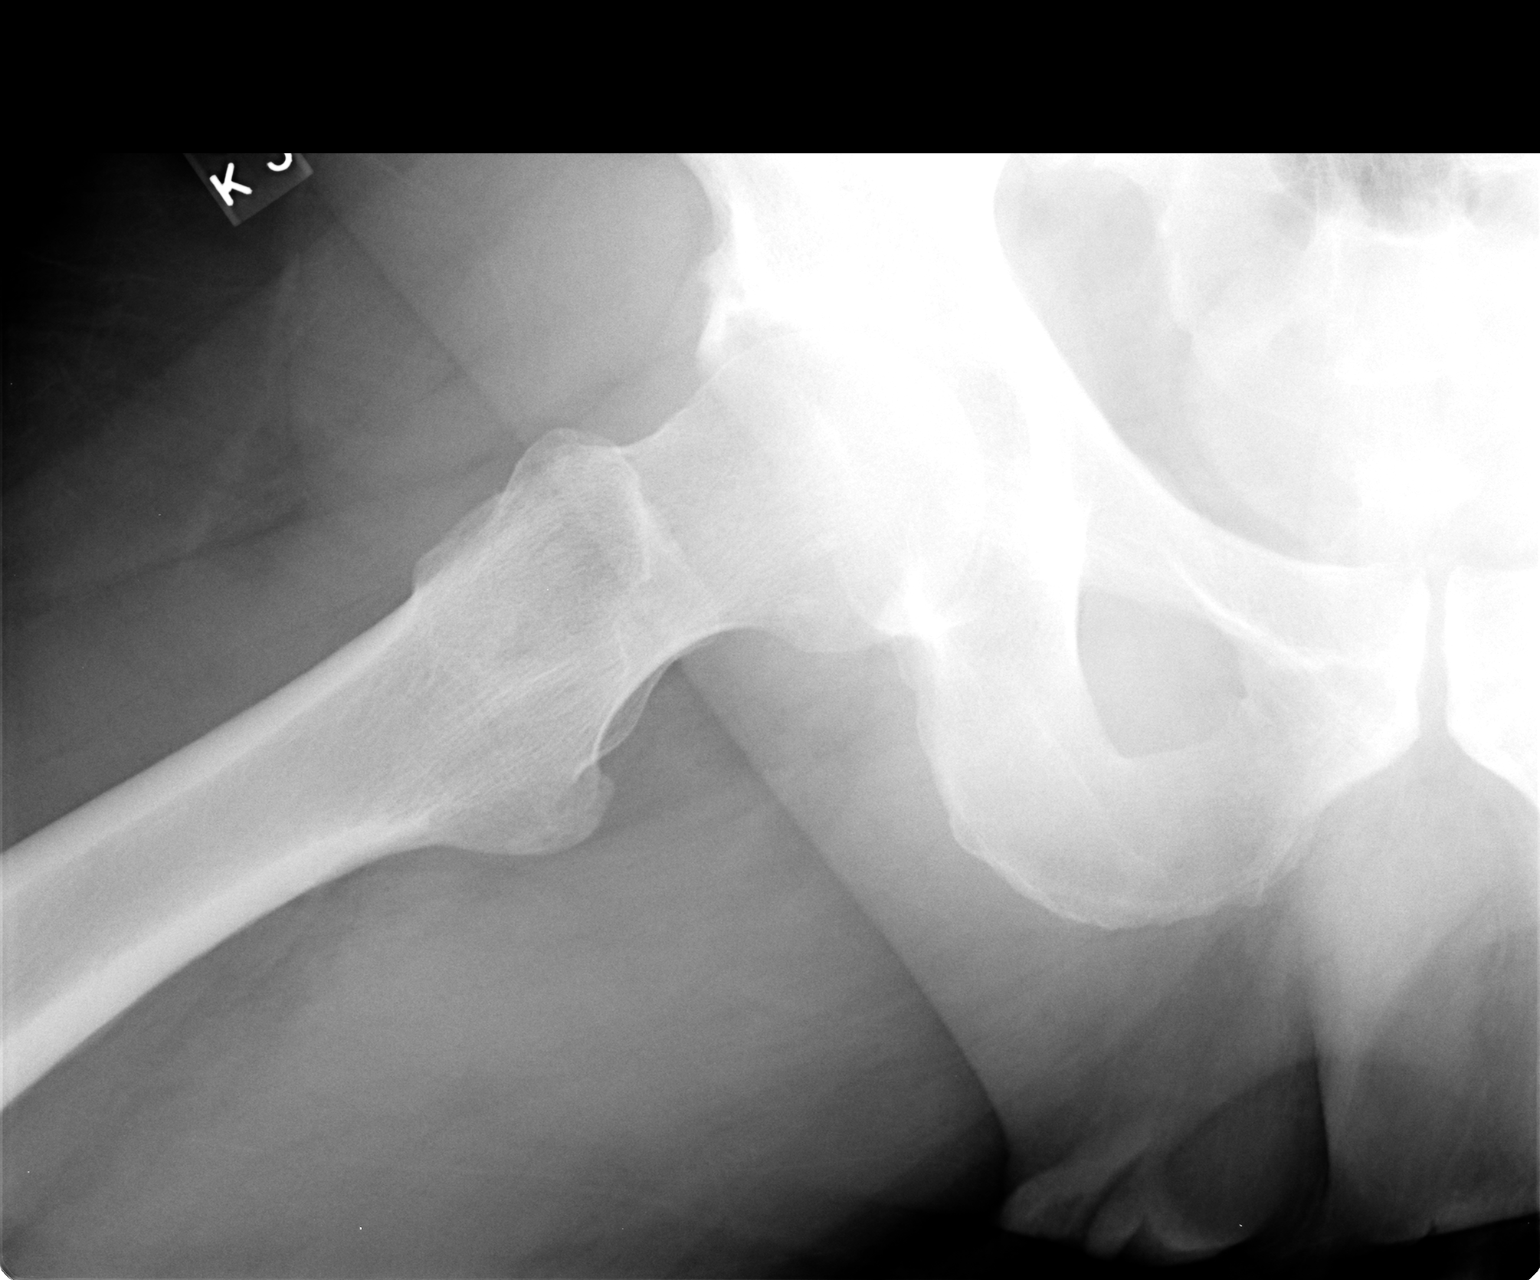

[3 of 3 positions shown; findings below may reference images not displayed]

FINDINGS: There is some degenerative spurring from the superior acetabulum of
the right hip. However no acute fracture is seen. The pelvic rami
are intact. The SI joints appear corticated.
IMPRESSION: Degenerative spurring from the right superior acetabulum. No acute
fracture.

## 2015-02-01 ENCOUNTER — Other Ambulatory Visit: Payer: Self-pay | Admitting: Internal Medicine

## 2015-02-01 ENCOUNTER — Other Ambulatory Visit: Payer: Self-pay

## 2015-02-26 ENCOUNTER — Encounter: Payer: Self-pay | Admitting: Internal Medicine

## 2015-02-26 ENCOUNTER — Ambulatory Visit (INDEPENDENT_AMBULATORY_CARE_PROVIDER_SITE_OTHER): Payer: BLUE CROSS/BLUE SHIELD | Admitting: Internal Medicine

## 2015-02-26 VITALS — BP 122/74 | HR 97 | Temp 97.5°F | Ht 69.0 in | Wt 235.5 lb

## 2015-02-26 DIAGNOSIS — I809 Phlebitis and thrombophlebitis of unspecified site: Secondary | ICD-10-CM | POA: Diagnosis not present

## 2015-02-26 DIAGNOSIS — Z09 Encounter for follow-up examination after completed treatment for conditions other than malignant neoplasm: Secondary | ICD-10-CM

## 2015-02-26 HISTORY — DX: Encounter for follow-up examination after completed treatment for conditions other than malignant neoplasm: Z09

## 2015-02-26 NOTE — Assessment & Plan Note (Signed)
Superficial phlebitis Findings consistent with superficial phlebitis, doubt a DVT on clinical grounds. We agreed on conservative treatment with aspirin, warm compresses. Prompt call back if he is not improving.

## 2015-02-26 NOTE — Progress Notes (Signed)
Pre visit review using our clinic review tool, if applicable. No additional management support is needed unless otherwise documented below in the visit note. 

## 2015-02-26 NOTE — Progress Notes (Signed)
Subjective:    Patient ID: Oscar Fuller, male    DOB: 1955/12/12, 59 y.o.   MRN: [id number]  DOS:  02/26/2015 Type of visit - description : Acute Interval history: Symptoms started 2 days ago with redness, swelling, pain at the inner aspect of the right thigh. Insect bite? He has actually not seen any insects or spiders. Last night she had a lot of sweats but no actual fever or chills. This morning the area looks better.   Review of Systems No chest pain or difficulty breathing No recent airplane trip or prolonged car trip. No calf pain or swelling  Past Medical History  Diagnosis Date  . Hyperlipidemia   . Increased prostate specific antigen (PSA) velocity     saw Dr Joelyn Oms, s/p Bx(-) 2002  . SCC (squamous cell carcinoma)     Dr Sherrye Payor, in the back  . ED (erectile dysfunction) 07/2008    used some cialis, symptoms not active at present   . Hypertension     was on ACEi-diuretics  . Depression with anxiety   . Atypical chest pain     with ER visit in 8/11. ETT-myoview showed EF 61% no ischemia or infaraction 8/11  . OSA (obstructive sleep apnea) 06/08/2012    HST 05/2012:  AHI 34/hr with desat as low as 69%.   . Prediabetes 09/07/2013  . Pulmonary embolism ([place]) 11-2013    PE and L DVT after a trip  . DVT (deep venous thrombosis) ([place])   . Sleep apnea     Past Surgical History  Procedure Laterality Date  . Vasectomy    . Knee surgery  2009    left arthroscopy    Social History   Social History  . Marital Status: Married    Spouse Name: N/A  . Number of Children: 2  . Years of Education: BS   Occupational History  . [employer]    Social History Main Topics  . Smoking status: Never Smoker   . Smokeless tobacco: Never Used  . Alcohol Use: No     Comment: occassional--rare  . Drug Use: No  . Sexual Activity: Not on file   Other Topics Concern  . Not on file   Social History Narrative   Married x 2  , 2nd wife [name] has a h/o SZ d/o                 Medication List       This list is accurate as of: 02/26/15  5:54 PM.  Always use your most recent med list.               acetaminophen 500 MG tablet  Commonly known as:  TYLENOL  Take 1,000 mg by mouth every 6 (six) hours as needed (pain).     atorvastatin 40 MG tablet  Commonly known as:  LIPITOR  Take 1 tablet (40 mg total) by mouth daily.     sertraline 100 MG tablet  Commonly known as:  ZOLOFT  Take 1 tablet (100 mg total) by mouth daily.     zolpidem 10 MG tablet  Commonly known as:  AMBIEN  Take 1 tablet (10 mg total) by mouth at bedtime as needed for sleep.           Objective:   Physical Exam  Constitutional: He is oriented to person, place, and time. He appears well-developed and well-nourished. No distress.  Musculoskeletal: He exhibits no edema.  Legs: Neurological: He is alert and oriented to person, place, and time.  Skin: He is not diaphoretic.  Psychiatric: He has a normal mood and affect. His behavior is normal. Judgment and thought content normal.   BP 122/74 mmHg  Pulse 97  Temp(Src) 97.5 F (36.4 C) (Oral)  Ht 5\' 9"  (1.753 m)  Wt 235 lb 8 oz (106.822 kg)  BMI 34.76 kg/m2  SpO2 96%     Assessment & Plan:    Assessment > Prediabetes  HTN  hyperlipidemia  depression, anxiety OSA 2014 H/o  PE, DVT 11-2013 after a trip  chest pain : stress test no ischemia 2011  increase PSA velocity BX 2002  Plan Superficial phlebitis Findings consistent with superficial phlebitis, doubt a DVT on clinical grounds. We agreed on conservative treatment with aspirin, warm compresses. Prompt call back if he is not improving.

## 2015-02-26 NOTE — Patient Instructions (Signed)
Aspirin 325 mg 3 times a day for one week, watch for stomach irritation, take it with food. Warm compresses Call if not improving in the next 3 or 4 days, call if you get worse or you have swelling at the calves     Phlebitis Phlebitis is soreness and swelling (inflammation) of a vein. This can occur in your arms, legs, or torso (trunk), as well as deeper inside your body. Phlebitis is usually not serious when it occurs close to the surface of the body. However, it can cause serious problems when it occurs in a vein deeper inside the body. CAUSES  Phlebitis can be triggered by various things, including:   Reduced blood flow through your veins. This can happen with:  Bed rest over a long period.  Long-distance travel.  Injury.  Surgery.  Being overweight (obese) or pregnant.  Having an IV tube put in the vein and getting certain medicines through the vein.  Cancer and cancer treatment.  Use of illegal drugs taken through the vein.  Inflammatory diseases.  Inherited (genetic) diseases that increase the risk of blood clots.  Hormone therapy, such as birth control pills. SIGNS AND SYMPTOMS   Red, tender, swollen, and painful area on your skin. Usually, the area will be long and narrow.  Firmness along the center of the affected area. This can indicate that a blood clot has formed.  Low-grade fever. DIAGNOSIS  A health care provider can usually diagnose phlebitis by examining the affected area and asking about your symptoms. To check for infection or blood clots, your health care provider may order blood tests or an ultrasound exam of the area. Blood tests and your family history may also indicate if you have an underlying genetic disease that causes blood clots. Occasionally, a piece of tissue is taken from the body (biopsy sample) if an unusual cause of phlebitis is suspected. TREATMENT  Treatment will vary depending on the severity of the condition and the area of the body  affected. Treatment may include:  Use of a warm compress or heating pad.  Use of compression stockings or bandages.  Anti-inflammatory medicines.  Removal of any IV tube that may be causing the problem.  Medicines that kill germs (antibiotics) if an infection is present.  Blood-thinning medicines if a blood clot is suspected or present.  In rare cases, surgery may be needed to remove damaged sections of vein. HOME CARE INSTRUCTIONS   Only take over-the-counter or prescription medicines as directed by your health care provider. Take all medicines exactly as prescribed.  Raise (elevate) the affected area above the level of your heart as directed by your health care provider.  Apply a warm compress or heating pad to the affected area as directed by your health care provider. Do not sleep with the heating pad.  Use compression stockings or bandages as directed. These will speed healing and prevent the condition from coming back.  If you are on blood thinners:  Get follow-up blood tests as directed by your health care provider.  Check with your health care provider before using any new medicines.  Carry a medical alert card or wear your medical alert jewelry to show that you are on blood thinners.  For phlebitis in the legs:  Avoid prolonged standing or bed rest.  Keep your legs moving. Raise your legs when sitting or lying.  Do not smoke.  Women, particularly those over the age of 79, should consider the risks and benefits of taking the  contraceptive pill. This kind of hormone treatment can increase your risk for blood clots.  Follow up with your health care provider as directed. SEEK MEDICAL CARE IF:   You have unusual bruising or any bleeding problems.  Your swelling or pain in the affected area is not improving.  You are on anti-inflammatory medicine, and you develop belly (abdominal) pain. SEEK IMMEDIATE MEDICAL CARE IF:   You have a sudden onset of chest pain or  difficulty breathing.  You have a fever or persistent symptoms for more than 2-3 days.  You have a fever and your symptoms suddenly get worse. MAKE SURE YOU:  Understand these instructions.  Will watch your condition.  Will get help right away if you are not doing well or get worse.   This information is not intended to replace advice given to you by your health care provider. Make sure you discuss any questions you have with your health care provider.   Document Released: 04/22/2001 Document Revised: 02/16/2013 Document Reviewed: 01/03/2013 Elsevier Interactive Patient Education [employer].

## 2015-03-02 ENCOUNTER — Telehealth: Payer: Self-pay | Admitting: Internal Medicine

## 2015-03-02 ENCOUNTER — Ambulatory Visit (HOSPITAL_BASED_OUTPATIENT_CLINIC_OR_DEPARTMENT_OTHER)
Admission: RE | Admit: 2015-03-02 | Discharge: 2015-03-02 | Disposition: A | Discharge status: Home / Self Care | Payer: BLUE CROSS/BLUE SHIELD | Source: Ambulatory Visit | Attending: Internal Medicine | Admitting: Internal Medicine

## 2015-03-02 ENCOUNTER — Telehealth: Payer: Self-pay

## 2015-03-02 DIAGNOSIS — M79651 Pain in right thigh: Secondary | ICD-10-CM | POA: Diagnosis present

## 2015-03-02 DIAGNOSIS — I82811 Embolism and thrombosis of superficial veins of right lower extremities: Secondary | ICD-10-CM | POA: Insufficient documentation

## 2015-03-02 DIAGNOSIS — I809 Phlebitis and thrombophlebitis of unspecified site: Secondary | ICD-10-CM

## 2015-03-02 MED ORDER — CEPHALEXIN 500 MG PO CAPS: 500.0000 mg | ORAL_CAPSULE | Freq: Four times a day (QID) | ORAL | Status: DC

## 2015-03-02 MED ORDER — RIVAROXABAN 20 MG PO TABS: 20.0000 mg | ORAL_TABLET | Freq: Every day | ORAL | Status: DC

## 2015-03-02 MED ORDER — RIVAROXABAN 15 MG PO TABS: 15.0000 mg | ORAL_TABLET | Freq: Two times a day (BID) | ORAL | Status: DC

## 2015-03-02 NOTE — Telephone Encounter (Signed)
Relation to HT:XHFS Call back number:[phone] Pharmacy:  Reason for call:  Patient was last seen 02/26/2015 as per AVS: Call if not improving in the next 3 or 4 days, call if you get worse or you have swelling at the calves, symtopms not improving please follow up with patient directly.

## 2015-03-02 NOTE — Telephone Encounter (Signed)
[place] Right Leg results

## 2015-03-02 NOTE — Telephone Encounter (Signed)
[place] Ultrasound R leg ordered STAT, r/o DVT. Keflex sent to pharmacy by Dr. Larose Kells.

## 2015-03-02 NOTE — Telephone Encounter (Signed)
Please advise 

## 2015-03-02 NOTE — Telephone Encounter (Signed)
Spoke with the patient, redness is ~ the same but g=has a similar area distal to the knee. Denies f/c, calf swellin or tenderness per se. Plan: Keflex Arrange a [place] R leg, r/o DVT for today ER if sx increase  F/u next week

## 2015-03-02 NOTE — Telephone Encounter (Signed)
Ultrasound of the right leg: occluded  saphenous vein from the thigh to slt below knee . No DVT. Had a DVT PE 11-2013, he is high risk for clots. Plan: -d/c aspirin, -start  xarelto 15 mg twice a day for 2 weeks then 20 mg daily. He is aware of anticoagulation risk as he took xarelto before. -Refer to hematology -Keflex already prescribed. -Call if symptoms increase, okay to cancel office visit next week if he is improving -warm compress -Written instructions provided

## 2015-03-05 ENCOUNTER — Encounter: Payer: Self-pay | Admitting: Internal Medicine

## 2015-03-05 ENCOUNTER — Ambulatory Visit (INDEPENDENT_AMBULATORY_CARE_PROVIDER_SITE_OTHER): Payer: BLUE CROSS/BLUE SHIELD | Admitting: Internal Medicine

## 2015-03-05 VITALS — BP 124/74 | HR 82 | Temp 98.1°F | Ht 69.0 in | Wt 234.1 lb

## 2015-03-05 DIAGNOSIS — I809 Phlebitis and thrombophlebitis of unspecified site: Secondary | ICD-10-CM

## 2015-03-05 DIAGNOSIS — Z09 Encounter for follow-up examination after completed treatment for conditions other than malignant neoplasm: Secondary | ICD-10-CM

## 2015-03-05 MED ORDER — ZOLPIDEM TARTRATE 10 MG PO TABS: 10.0000 mg | ORAL_TABLET | Freq: Every evening | ORAL | Status: DC | PRN

## 2015-03-05 NOTE — Progress Notes (Signed)
Pre visit review using our clinic review tool, if applicable. No additional management support is needed unless otherwise documented below in the visit note. 

## 2015-03-05 NOTE — Assessment & Plan Note (Signed)
Thrombophlebitis: Diagnosed last week with right leg thrombophlebitis, on Xarelto and Keflex, improving. Of concern is the chest pain he c/o; symptoms are mild, atypical for PE, not tachycardia and has no other symptoms. We talk about possibly doing a CT chest  but at the end we decided not to proceed with that. We will continue with same medications, pending hematology eval; will call if increase/change  chest pain, difficulty breathing, increased leg swelling.

## 2015-03-05 NOTE — Patient Instructions (Signed)
  Next visit  for a physical exam by November or December , fasting Please schedule an appointment at the front desk

## 2015-03-05 NOTE — Progress Notes (Signed)
Subjective:    Patient ID: Oscar Fuller, male    DOB: Oct 23, 1955, 59 y.o.   MRN: [id number]  DOS:  03/05/2015 Type of visit - description : Follow-up, here with his wife Interval history: Diagnosed last week with thrombophlebitis, on Xarelto and antibiotics. Area looks better but still slightly tender.   Review of Systems No fever chills No difficulty breathing, palpitations or dyspnea on exertion however   he feels mild tenderness at the lateral chest left and right, mostly when he moves his torso, on-off. No rash. No  blood in the stools or in the urine  Past Medical History  Diagnosis Date  . Hyperlipidemia   . Increased prostate specific antigen (PSA) velocity     saw Dr Joelyn Oms, s/p Bx(-) 2002  . SCC (squamous cell carcinoma)     Dr Sherrye Payor, in the back  . ED (erectile dysfunction) 07/2008    used some cialis, symptoms not active at present   . Hypertension     was on ACEi-diuretics  . Depression with anxiety   . Atypical chest pain     with ER visit in 8/11. ETT-myoview showed EF 61% no ischemia or infaraction 8/11  . OSA (obstructive sleep apnea) 06/08/2012    HST 05/2012:  AHI 34/hr with desat as low as 69%.   . Prediabetes 09/07/2013  . Pulmonary embolism ([place]) 11-2013    PE and L DVT after a trip  . DVT (deep venous thrombosis) ([place])   . Sleep apnea     Past Surgical History  Procedure Laterality Date  . Vasectomy    . Knee surgery  2009    left arthroscopy    Social History   Social History  . Marital Status: Married    Spouse Name: N/A  . Number of Children: 2  . Years of Education: BS   Occupational History  . [employer]    Social History Main Topics  . Smoking status: Never Smoker   . Smokeless tobacco: Never Used  . Alcohol Use: No     Comment: occassional--rare  . Drug Use: No  . Sexual Activity: Not on file   Other Topics Concern  . Not on file   Social History Narrative   Married x 2  , 2nd wife [name] has a h/o SZ d/o               Medication List       This list is accurate as of: 03/05/15  6:04 PM.  Always use your most recent med list.               acetaminophen 500 MG tablet  Commonly known as:  TYLENOL  Take 1,000 mg by mouth every 6 (six) hours as needed (pain).     atorvastatin 40 MG tablet  Commonly known as:  LIPITOR  Take 1 tablet (40 mg total) by mouth daily.     cephALEXin 500 MG capsule  Commonly known as:  KEFLEX  Take 1 capsule (500 mg total) by mouth 4 (four) times daily.     rivaroxaban 20 MG Tabs tablet  Commonly known as:  XARELTO  Take 1 tablet (20 mg total) by mouth daily with supper.     Rivaroxaban 15 MG Tabs tablet  Commonly known as:  XARELTO  Take 1 tablet (15 mg total) by mouth 2 (two) times daily with a meal.     sertraline 100 MG tablet  Commonly known as:  ZOLOFT  Take  1 tablet (100 mg total) by mouth daily.     zolpidem 10 MG tablet  Commonly known as:  AMBIEN  Take 1 tablet (10 mg total) by mouth at bedtime as needed for sleep.           Objective:   Physical Exam BP 124/74 mmHg  Pulse 82  Temp(Src) 98.1 F (36.7 C) (Oral)  Ht 5\' 9"  (1.753 m)  Wt 234 lb 2 oz (106.198 kg)  BMI 34.56 kg/m2  SpO2 99% General:   Well developed, well nourished . NAD.  HEENT:  Normocephalic . Face symmetric, atraumatic Lungs:  CTA B Normal respiratory effort, no intercostal retractions, no accessory muscle use. Heart: RRR,  no murmur.  no pretibial edema bilaterally  Abdomen:  Not distended, soft, non-tender. No rebound or rigidity.  Lower extremities: Area of  thrombophlebitis is less red, swollen, slightly TTP. Calves: R is 1/2 cm larger in circumference but no TTP. Skin: Not pale. Not jaundice Neurologic:  alert & oriented X3.  Speech normal, gait appropriate for age and unassisted Psych--  Cognition and judgment appear intact.  Cooperative with normal attention span and concentration.  Behavior appropriate. No anxious or depressed appearing.     Assessment & Plan:   Assessment > Prediabetes HTN Hyperlipidemia  depression, anxiety OSA 2014 Hematology --H/o  PE, DVT 11-2013 after a trip  --Thrombophlebitis 02-2015 chest pain : stress test no ischemia 2011  increase PSA velocity BX 2002  Plan  Thrombophlebitis: Diagnosed last week with right leg thrombophlebitis, on Xarelto and Keflex, improving. Of concern is the chest pain he c/o; symptoms are mild, atypical for PE, not tachycardia and has no other symptoms. We talk about possibly doing a CT chest  but at the end we decided not to proceed with that. We will continue with same medications, pending hematology eval; will call if increase/change  chest pain, difficulty breathing, increased leg swelling.  Today, I spent more than 25   min with the patient: >50% of the time counseling regards the diagnoses of thrombophlebitis, the need for anticoagulation and antibiotics, multiple questions answer to the best of my ability from the patient and wife.

## 2015-03-07 ENCOUNTER — Ambulatory Visit: Payer: BLUE CROSS/BLUE SHIELD | Admitting: Internal Medicine

## 2015-03-09 ENCOUNTER — Encounter: Payer: Self-pay | Admitting: Internal Medicine

## 2015-04-26 ENCOUNTER — Ambulatory Visit (HOSPITAL_BASED_OUTPATIENT_CLINIC_OR_DEPARTMENT_OTHER): Payer: BLUE CROSS/BLUE SHIELD

## 2015-04-26 ENCOUNTER — Ambulatory Visit (HOSPITAL_BASED_OUTPATIENT_CLINIC_OR_DEPARTMENT_OTHER): Payer: BLUE CROSS/BLUE SHIELD | Admitting: Hematology & Oncology

## 2015-04-26 ENCOUNTER — Ambulatory Visit: Payer: BLUE CROSS/BLUE SHIELD

## 2015-04-26 VITALS — BP 136/86 | HR 77 | Temp 97.8°F | Resp 16 | Wt 236.0 lb

## 2015-04-26 DIAGNOSIS — I82811 Embolism and thrombosis of superficial veins of right lower extremities: Secondary | ICD-10-CM | POA: Diagnosis not present

## 2015-04-26 DIAGNOSIS — I2699 Other pulmonary embolism without acute cor pulmonale: Secondary | ICD-10-CM

## 2015-04-26 DIAGNOSIS — I82409 Acute embolism and thrombosis of unspecified deep veins of unspecified lower extremity: Secondary | ICD-10-CM

## 2015-04-26 DIAGNOSIS — Z86711 Personal history of pulmonary embolism: Secondary | ICD-10-CM | POA: Diagnosis not present

## 2015-04-26 LAB — CBC WITH DIFFERENTIAL (CANCER CENTER ONLY)
BASO#: 0 10*3/uL (ref 0.0–0.2)
BASO%: 0.3 % (ref 0.0–2.0)
EOS%: 1.9 % (ref 0.0–7.0)
Eosinophils Absolute: 0.1 10*3/uL (ref 0.0–0.5)
HEMATOCRIT: 48.2 % (ref 38.7–49.9)
HGB: 16.9 g/dL (ref 13.0–17.1)
LYMPH#: 3 10*3/uL (ref 0.9–3.3)
LYMPH%: 39.1 % (ref 14.0–48.0)
MCH: 29.8 pg (ref 28.0–33.4)
MCHC: 35.1 g/dL (ref 32.0–35.9)
MCV: 85 fL (ref 82–98)
MONO#: 0.8 10*3/uL (ref 0.1–0.9)
MONO%: 10.1 % (ref 0.0–13.0)
NEUT#: 3.7 10*3/uL (ref 1.5–6.5)
NEUT%: 48.6 % (ref 40.0–80.0)
Platelets: 168 10*3/uL (ref 145–400)
RBC: 5.68 10*6/uL (ref 4.20–5.70)
RDW: 13.4 % (ref 11.1–15.7)
WBC: 7.6 10*3/uL (ref 4.0–10.0)

## 2015-04-26 LAB — CHCC SATELLITE - SMEAR

## 2015-04-26 NOTE — Progress Notes (Addendum)
Referral MD  Reason for Referral:  Past history of pulmonary emboli and recent right thigh superficial thrombus  Chief Complaint  Patient presents with  . New Evaluation  :  I have had a problem with blood clots.  HPI:  Mr. Bruderer Is a real nice 59 year old white male. He works for [employer]. He does do a lot of traveling.   his history dates back last year when he had some right sided chest wall pain. He had no cough. He had no real shortness of breath. He had no hemoptysis.   He ultimately had a CT angiogram done. This is an late July 2015. This showed diffuse bilateral acute pulmonary emboli with mild right heart strain.   of note, he drove down to [place] the week prior. He did have a Doppler of his legs. He is found to have a left lower extremity thrombus in the popliteal vein.   he was placed on Xarelto for 6 months. A follow-up CT angiogram done in late November did not show any evidence of residual  pulmonaryr emboli. A repeat Doppler of his legs showed no residual thrombus.   He now says that he is having some discomfort in the inner right thigh. He noticed some redness. There is no real swelling.   he had a Doppler done. This showed a thrombus in the greater saphenous vein.   He has been restarted on Xarelto.   He does not smoke. He does not have diabetes. He does travel a little bit with his job.   He does not take testosterone supplementation.   There is no family history of thromboembolic disease.   He is  Kindly refer to the [facility] for further recommendations for anticoagulation.   He's had no problems bleeding. His appetite is good. He's had no change in bowel or bladder habits. His last colonoscopy was about a year or so ago.   overall, his performance status is ECOG 0.                 Past Medical History  Diagnosis Date  . Hyperlipidemia   . Increased prostate specific antigen (PSA) velocity     saw Dr Joelyn Oms, s/p  Bx(-) 2002  . SCC (squamous cell carcinoma)     Dr Sherrye Payor, in the back  . ED (erectile dysfunction) 07/2008    used some cialis, symptoms not active at present   . Hypertension     was on ACEi-diuretics  . Depression with anxiety   . Atypical chest pain     with ER visit in 8/11. ETT-myoview showed EF 61% no ischemia or infaraction 8/11  . OSA (obstructive sleep apnea) 06/08/2012    HST 05/2012:  AHI 34/hr with desat as low as 69%.   . Prediabetes 09/07/2013  . Pulmonary embolism ([place]) 11-2013    PE and L DVT after a trip  . DVT (deep venous thrombosis) ([place])   . Sleep apnea   :  Past Surgical History  Procedure Laterality Date  . Vasectomy    . Knee surgery  2009    left arthroscopy  :   Current outpatient prescriptions:  .  atorvastatin (LIPITOR) 40 MG tablet, Take 1 tablet (40 mg total) by mouth daily., Disp: 90 tablet, Rfl: 0 .  rivaroxaban (XARELTO) 20 MG TABS tablet, Take 1 tablet (20 mg total) by mouth daily with supper. (Patient not taking: Reported on 03/05/2015), Disp: 30 tablet, Rfl: 0 .  sertraline (ZOLOFT)  100 MG tablet, Take 1 tablet (100 mg total) by mouth daily., Disp: 90 tablet, Rfl: 2 .  zolpidem (AMBIEN) 10 MG tablet, Take 1 tablet (10 mg total) by mouth at bedtime as needed for sleep., Disp: 90 tablet, Rfl: 1:  :  No Known Allergies:  Family History  Problem Relation Age of Onset  . Colon cancer Neg Hx   . Prostate cancer Neg Hx   . Coronary artery disease Father     stent 35, GF GM  . Hypertension Father     F B  . Lung cancer Father   . Heart attack Brother     F B M GM  . Glaucoma Mother   . Heart disease Mother   . Stroke Other     CVA GM  . Stomach cancer Paternal Grandfather   . Gout Brother   . Migraines Sister   :  Social History   Social History  . Marital Status: Married    Spouse Name: N/A  . Number of Children: 2  . Years of Education: BS   Occupational History  . [employer]    Social History Main Topics  . Smoking status:  Never Smoker   . Smokeless tobacco: Never Used  . Alcohol Use: No     Comment: occassional--rare  . Drug Use: No  . Sexual Activity: Not on file   Other Topics Concern  . Not on file   Social History Narrative   Married x 2  , 2nd wife [name] has a h/o SZ d/o         :  Pertinent items are noted in HPI.  Exam: @IPVITALS @  well-developed and well-nourished  In no obvious distress. Vital signs are temperature of 97 8. Pulse 77. Blood pressure 136/86. Weight is 236 pounds. Head and neck exam shows no ocular or oral lesions. There are no palpable cervical or supraclavicular lymph nodes. Lungs are clear to percussion and auscultation bilaterally. Cardiac exam regular rate and rhythm with no murmurs, rubs or bruits. Abdomen is soft. He has good bowel sounds. There is no fluid wave. There is no palpable liver or spleen tip. Back exam shows no tenderness over the spine, ribs or hips. Extremities shows no clubbing, cyanosis or edema. Neurological exam shows no focal neurological deficits. Skin exam shows no rashes, ecchymoses or petechia.   Recent Labs  04/26/15 1342  WBC 7.6  HGB 16.9  HCT 48.2  PLT 168   No results for input(s): NA, K, CL, CO2, GLUCOSE, BUN, CREATININE, CALCIUM in the last 72 hours.  Blood smear review:   none  Pathology:  none    Assessment and Plan:   Mr. Mcclements is a 59 year old white male with a past history of a pulmonary embolism. He is on anticoagulation for 6 months. He now has a superficial thrombus in the right greater saphenous vein.    I don't think we have to commit him to lifelong anticoagulation.   I think that from my point of view, I probably would've treated the pulmonary embolism with at least a year of therapy. That may have been adequate enough to preventative a second thromboembolic event from happening this year.    I will send off a hypercoagulable studies. I would think that they will come back as normal.   I will keep him on Xarelto for 6  months. He had the most recent superficial thrombus in October.   I will plan to get him back in April.  I will do a Doppler of his right leg the same day that I see him. If all looks good , and there is no residual thrombus noted, I would get him off Xarelto and then put him on  2 baby aspirin a day. I think that this would be very reasonable.   I spent about 45 minutes with he and his wife. They're both very nice. It was nice talking with them. Ice when our recommendations to them. They both understand very well.

## 2015-04-30 LAB — HEMOGLOBINOPATHY EVALUATION
HGB A: 97.3 % (ref 96.8–97.8)
Hemoglobin Other: 0 %
Hgb A2 Quant: 2.7 % (ref 2.2–3.2)
Hgb F Quant: 0 % (ref 0.0–2.0)
Hgb S Quant: 0 %

## 2015-05-01 ENCOUNTER — Other Ambulatory Visit: Payer: Self-pay | Admitting: Internal Medicine

## 2015-05-02 LAB — HYPERCOAGULABLE PANEL, COMPREHENSIVE
AntiThromb III Func: 121 % activity — ABNORMAL HIGH (ref 80–120)
Anticardiolipin IgG: <14 [GPL'U]
BETA-2-GLYCOPROTEIN I IGM: 9 SMU (ref <=20)
Beta-2 Glyco I IgG: <9 SGU (ref <=20)
PROTEIN S ANTIGEN, TOTAL: 106 % (ref 70–140)
Protein C Antigen: 140 % (ref 70–140)
Protein S Activity: 112 % (ref 70–150)

## 2015-05-02 LAB — RFX DRVVT SCR W/RFLX CONF 1:1 MIX: DRVVT SCREEN: 49 s — AB (ref <=45)

## 2015-05-02 LAB — RFLX HEXAGONAL PHASE CONFIRM: Hexagonal Phase Confirm: NEGATIVE

## 2015-05-02 LAB — RFX PTT-LA W/RFX TO HEX PHASE CONF: PTT-LA Screen: 44 s — ABNORMAL HIGH (ref <=40)

## 2015-05-02 LAB — RFLX DRVVT CONFRIM: Drvvt confirmation: NEGATIVE

## 2015-05-03 ENCOUNTER — Telehealth: Payer: Self-pay | Admitting: *Deleted

## 2015-05-03 NOTE — Telephone Encounter (Signed)
Transcribed message sent by Dr. Marin Olp.  Dr. Johnette Abraham left a message on patient cell phone regarding the results of his hypercoagulable studies. We do find that he has a heterozygous mutation of factor V Leiden. Because of this, he is hypercoagulable. I told him that he will stay on the Xarelto for 6 months. I don't think we have to make any changes. I will see him back next year. We will get a Doppler will we see him back. I told him he can always give [place] a call if he has new questions.    pete

## 2015-06-09 ENCOUNTER — Other Ambulatory Visit: Payer: Self-pay | Admitting: Internal Medicine

## 2015-06-11 ENCOUNTER — Other Ambulatory Visit: Payer: Self-pay | Admitting: Internal Medicine

## 2015-06-12 MED ORDER — RIVAROXABAN 20 MG PO TABS: 20.0000 mg | ORAL_TABLET | Freq: Every day | ORAL | Status: DC

## 2015-07-08 ENCOUNTER — Encounter: Payer: Self-pay | Admitting: Internal Medicine

## 2015-07-09 MED ORDER — SERTRALINE HCL 100 MG PO TABS
100.0000 mg | ORAL_TABLET | Freq: Every day | ORAL | Status: DC
Start: 2015-07-09 — End: 2015-09-17

## 2015-07-12 ENCOUNTER — Encounter: Payer: Self-pay | Admitting: Internal Medicine

## 2015-07-12 MED ORDER — RIVAROXABAN 20 MG PO TABS: 20.0000 mg | ORAL_TABLET | Freq: Every day | ORAL | Status: DC

## 2015-07-15 ENCOUNTER — Other Ambulatory Visit: Payer: Self-pay | Admitting: Internal Medicine

## 2015-07-16 NOTE — Telephone Encounter (Signed)
  Med denied per last Refill note.

## 2015-08-24 ENCOUNTER — Other Ambulatory Visit (HOSPITAL_BASED_OUTPATIENT_CLINIC_OR_DEPARTMENT_OTHER): Payer: BLUE CROSS/BLUE SHIELD

## 2015-08-24 ENCOUNTER — Ambulatory Visit: Payer: BLUE CROSS/BLUE SHIELD | Admitting: Hematology & Oncology

## 2015-08-24 ENCOUNTER — Other Ambulatory Visit: Payer: BLUE CROSS/BLUE SHIELD

## 2015-08-27 ENCOUNTER — Ambulatory Visit (HOSPITAL_BASED_OUTPATIENT_CLINIC_OR_DEPARTMENT_OTHER): Payer: BLUE CROSS/BLUE SHIELD

## 2015-08-27 ENCOUNTER — Ambulatory Visit: Payer: BLUE CROSS/BLUE SHIELD | Admitting: Hematology & Oncology

## 2015-08-27 ENCOUNTER — Other Ambulatory Visit: Payer: BLUE CROSS/BLUE SHIELD

## 2015-08-31 ENCOUNTER — Ambulatory Visit (HOSPITAL_BASED_OUTPATIENT_CLINIC_OR_DEPARTMENT_OTHER)
Admission: RE | Admit: 2015-08-31 | Discharge: 2015-08-31 | Disposition: A | Discharge status: Home / Self Care | Payer: BLUE CROSS/BLUE SHIELD | Source: Ambulatory Visit | Attending: Hematology & Oncology | Admitting: Hematology & Oncology

## 2015-08-31 DIAGNOSIS — I2699 Other pulmonary embolism without acute cor pulmonale: Secondary | ICD-10-CM | POA: Diagnosis not present

## 2015-08-31 DIAGNOSIS — I82409 Acute embolism and thrombosis of unspecified deep veins of unspecified lower extremity: Secondary | ICD-10-CM | POA: Diagnosis present

## 2015-08-31 DIAGNOSIS — I82591 Chronic embolism and thrombosis of other specified deep vein of right lower extremity: Secondary | ICD-10-CM | POA: Insufficient documentation

## 2015-08-31 DIAGNOSIS — I82401 Acute embolism and thrombosis of unspecified deep veins of right lower extremity: Secondary | ICD-10-CM | POA: Diagnosis not present

## 2015-09-06 ENCOUNTER — Ambulatory Visit (HOSPITAL_BASED_OUTPATIENT_CLINIC_OR_DEPARTMENT_OTHER): Payer: BLUE CROSS/BLUE SHIELD | Admitting: Hematology & Oncology

## 2015-09-06 ENCOUNTER — Encounter: Payer: Self-pay | Admitting: Hematology & Oncology

## 2015-09-06 ENCOUNTER — Other Ambulatory Visit (HOSPITAL_BASED_OUTPATIENT_CLINIC_OR_DEPARTMENT_OTHER): Payer: BLUE CROSS/BLUE SHIELD

## 2015-09-06 VITALS — BP 134/76 | HR 78 | Temp 98.1°F | Resp 16 | Ht 69.0 in | Wt 236.0 lb

## 2015-09-06 DIAGNOSIS — Z86711 Personal history of pulmonary embolism: Secondary | ICD-10-CM | POA: Diagnosis not present

## 2015-09-06 DIAGNOSIS — I2699 Other pulmonary embolism without acute cor pulmonale: Secondary | ICD-10-CM | POA: Diagnosis not present

## 2015-09-06 DIAGNOSIS — I82811 Embolism and thrombosis of superficial veins of right lower extremities: Secondary | ICD-10-CM | POA: Diagnosis not present

## 2015-09-06 DIAGNOSIS — I82409 Acute embolism and thrombosis of unspecified deep veins of unspecified lower extremity: Secondary | ICD-10-CM

## 2015-09-06 LAB — CBC WITH DIFFERENTIAL (CANCER CENTER ONLY)
BASO#: 0 10*3/uL (ref 0.0–0.2)
BASO%: 0.5 % (ref 0.0–2.0)
EOS%: 2.2 % (ref 0.0–7.0)
Eosinophils Absolute: 0.2 10*3/uL (ref 0.0–0.5)
HEMATOCRIT: 45.7 % (ref 38.7–49.9)
HEMOGLOBIN: 16.4 g/dL (ref 13.0–17.1)
LYMPH#: 3 10*3/uL (ref 0.9–3.3)
LYMPH%: 35.4 % (ref 14.0–48.0)
MCH: 30.9 pg (ref 28.0–33.4)
MCHC: 35.9 g/dL (ref 32.0–35.9)
MCV: 86 fL (ref 82–98)
MONO#: 0.8 10*3/uL (ref 0.1–0.9)
MONO%: 9.3 % (ref 0.0–13.0)
NEUT#: 4.5 10*3/uL (ref 1.5–6.5)
NEUT%: 52.6 % (ref 40.0–80.0)
Platelets: 181 10*3/uL (ref 145–400)
RBC: 5.3 10*6/uL (ref 4.20–5.70)
RDW: 13.1 % (ref 11.1–15.7)
WBC: 8.6 10*3/uL (ref 4.0–10.0)

## 2015-09-06 MED ORDER — RIVAROXABAN 10 MG PO TABS: 10.0000 mg | ORAL_TABLET | Freq: Every day | ORAL | Status: DC

## 2015-09-07 LAB — D-DIMER, QUANTITATIVE: D-DIMER: 0.3 mg/L FEU (ref 0.00–0.49)

## 2015-09-07 NOTE — Progress Notes (Signed)
Hematology and Oncology Follow Up Visit  Matthan Kivett Hamil [id number] March 07, 1956 60 y.o. 09/07/2015   Principle Diagnosis:   Thrombus of the right greater saphenous vein. Has history of pulmonary embolism  Current Therapy:    Xarelto 10 mg by mouth daily     Interim History:  Mr. Oscar Fuller is back for follow-up. He do well. He's not complaining of any pain or swelling in the right leg. He's had no cough. His had no shortness of breath. There's been no bleeding. He currently is on 20 mg a of Xarelto. I think we should put him on Xarelto at 10 mg a day.  We did go ahead and repeat a Doppler of his right leg. This showed no DVT. He has a persistent thrombus in the right greater saphenous vein.  He is traveling. He's having no problems with leg swelling. He is exercising.  He's had no nausea or vomiting. He's had no rashes. He's had no fatigue or weakness.  Medications:  Current outpatient prescriptions:  .  atorvastatin (LIPITOR) 40 MG tablet, Take 1 tablet (40 mg total) by mouth daily., Disp: 90 tablet, Rfl: 1 .  rivaroxaban (XARELTO) 10 MG TABS tablet, Take 1 tablet (10 mg total) by mouth daily with supper., Disp: 30 tablet, Rfl: 12 .  sertraline (ZOLOFT) 100 MG tablet, Take 1 tablet (100 mg total) by mouth daily., Disp: 30 tablet, Rfl: 1 .  zolpidem (AMBIEN) 10 MG tablet, Take 1 tablet (10 mg total) by mouth at bedtime as needed for sleep., Disp: 90 tablet, Rfl: 1  Allergies: No Known Allergies  Past Medical History, Surgical history, Social history, and Family History were reviewed and updated.  Review of Systems: As above  Physical Exam:  height is 5\' 9"  (1.753 m) and weight is 236 lb (107.049 kg). His oral temperature is 98.1 F (36.7 C). His blood pressure is 134/76 and his pulse is 78. His respiration is 16.   Wt Readings from Last 3 Encounters:  09/06/15 236 lb (107.049 kg)  04/26/15 236 lb (107.049 kg)  03/05/15 234 lb 2 oz (106.198 kg)     Well-developed well-nourished  white male in no obvious distress. Head and neck exam shows no ocular or oral lesions. He has no palpable cervical or supraclavicular lymph nodes. Lungs are clear. Cardiac exam regular rate and rhythm with no murmurs, rubs or bruits. Abdomen is soft. Has good bowel sounds. He has no fluid wave. There is no palpable liver or spleen tip. Extremities shows no clubbing, cyanosis or edema. There may be some slight nonpitting edema of the right lower leg. He has negative Homans sign. He has good range of motion of his joints. Neurological exam shows no focal neurological deficits.  Lab Results  Component Value Date   WBC 8.6 09/06/2015   HGB 16.4 09/06/2015   HCT 45.7 09/06/2015   MCV 86 09/06/2015   PLT 181 09/06/2015     Chemistry      Component Value Date/Time   NA 143 09/04/2014 1525   K 4.1 09/04/2014 1525   CL 109 09/04/2014 1525   CO2 27 09/04/2014 1525   BUN 13 09/04/2014 1525   CREATININE 1.05 09/04/2014 1525      Component Value Date/Time   CALCIUM 9.4 09/04/2014 1525   ALKPHOS 98 03/09/2013 1037   AST 25 03/13/2014 0839   ALT 28 03/13/2014 0839   BILITOT 0.8 03/09/2013 1037         Impression and Plan: Mr. Gelinas is  A 60 year old male. 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 His hypercoagulable studies have been normal.  Again, I think that 1 year maintenance Xarelto happy about idea. I think 10 mg a day would be reasonable.  I taught him about this. He is okay with this.  I want see him back in 6 months. I don't think we need any additional Doppler test on him.   Volanda Napoleon, MD 4/28/20177:13 AM

## 2015-09-12 ENCOUNTER — Ambulatory Visit: Payer: BLUE CROSS/BLUE SHIELD | Admitting: Pulmonary Disease

## 2015-09-16 ENCOUNTER — Other Ambulatory Visit: Payer: Self-pay | Admitting: Internal Medicine

## 2015-09-17 ENCOUNTER — Encounter: Payer: Self-pay | Admitting: Internal Medicine

## 2015-09-17 ENCOUNTER — Other Ambulatory Visit: Payer: Self-pay

## 2015-09-17 MED ORDER — SERTRALINE HCL 100 MG PO TABS: 100.0000 mg | ORAL_TABLET | Freq: Every day | ORAL | Status: DC

## 2015-11-10 ENCOUNTER — Other Ambulatory Visit: Payer: Self-pay | Admitting: Internal Medicine

## 2015-12-04 ENCOUNTER — Ambulatory Visit (INDEPENDENT_AMBULATORY_CARE_PROVIDER_SITE_OTHER): Payer: BLUE CROSS/BLUE SHIELD | Admitting: Pulmonary Disease

## 2015-12-04 ENCOUNTER — Encounter: Payer: Self-pay | Admitting: Pulmonary Disease

## 2015-12-04 VITALS — BP 128/88 | HR 100 | Ht 70.0 in | Wt 239.8 lb

## 2015-12-04 DIAGNOSIS — F518 Other sleep disorders not due to a substance or known physiological condition: Secondary | ICD-10-CM

## 2015-12-04 DIAGNOSIS — G4733 Obstructive sleep apnea (adult) (pediatric): Secondary | ICD-10-CM

## 2015-12-04 DIAGNOSIS — Z9989 Dependence on other enabling machines and devices: Principal | ICD-10-CM

## 2015-12-04 NOTE — Patient Instructions (Signed)
Please drop off the card from your CPAP machine and I will call with results  Follow up in 1 year

## 2015-12-04 NOTE — Progress Notes (Signed)
Current Outpatient Prescriptions on File Prior to Visit  Medication Sig  . atorvastatin (LIPITOR) 40 MG tablet Take 1 tablet (40 mg total) by mouth daily.  . rivaroxaban (XARELTO) 10 MG TABS tablet Take 1 tablet (10 mg total) by mouth daily with supper.  . sertraline (ZOLOFT) 100 MG tablet TAKE 1 TABLET (100 MG TOTAL) BY MOUTH DAILY.  Marland Kitchen zolpidem (AMBIEN) 10 MG tablet Take 1 tablet (10 mg total) by mouth at bedtime as needed for sleep.   No current facility-administered medications on file prior to visit.      Chief Complaint  Patient presents with  . Follow-up    Former [facility] pt: Wears CPAP most nights. Denies problems with pressure. Pt has 3 different masks that he is trying to pick between - Full face, nasal mask and nasal pillows. Discuss changing DME. Current DME: [employer].      Sleep tests HST 05/19/12 >> AHI 34, SpO2 low 69%  Past medical hx Depression, Anxiety, PE July 2015, DVT, ED, HTN, HLD  Past surgical hx, Allergies, Family hx, Social hx all reviewed.  Vital Signs BP 128/88 (BP Location: Left Arm, Cuff Size: Normal)   Pulse 100   Ht 5\' 10"  (1.778 m)   Wt 239 lb 12.8 oz (108.8 kg)   SpO2 96%   BMI 34.41 kg/m   History of Present Illness Oscar Fuller is a 60 y.o. male with obstructive sleep apnea.  He uses CPAP nightly.  He started with full face mask, then nasal mask.  Most recently has been using nasal pillows.  This is most comfortable.  He will go back to full face mask when he has sinus congestion >> not often.  He only gets about 5 hrs sleep per night.  Feels okay during the day.  He was using [place] >> this helped sleep more, but then he had hangover effect the next morning.  Physical Exam  General - No distress ENT - No sinus tenderness, no oral exudate, no LAN, MP 3, decreased AP diameter, scalloped tongue Cardiac - s1s2 regular, no murmur Chest - No wheeze/rales/dullness Back - No focal tenderness Abd - Soft, non-tender Ext - No edema Neuro - Normal  strength Skin - No rashes Psych - normal mood, and behavior   Assessment/Plan  Obstructive sleep apnea. - he is compliant with CPAP and reports benefit - he will drop off his SD card and I will call him with results  Short sleeper. - explained that he should try to dedicate more time to sleep if possible   Patient Instructions  Please drop off the card from your CPAP machine and I will call with results  Follow up in 1 year    Chesley Mires, MD [place] Pager:  [phone] 12/04/2015, 2:07 PM

## 2016-01-14 ENCOUNTER — Other Ambulatory Visit: Payer: Self-pay | Admitting: Family Medicine

## 2016-01-14 NOTE — Telephone Encounter (Signed)
Dr Paz patient 

## 2016-01-15 NOTE — Telephone Encounter (Signed)
I do not think I manage him please clarify,

## 2016-01-15 NOTE — Telephone Encounter (Signed)
Rx refilled today, 01/15/2016, by PCP.

## 2016-01-25 ENCOUNTER — Encounter: Payer: Self-pay | Admitting: Internal Medicine

## 2016-01-25 ENCOUNTER — Ambulatory Visit (INDEPENDENT_AMBULATORY_CARE_PROVIDER_SITE_OTHER): Payer: BLUE CROSS/BLUE SHIELD | Admitting: Internal Medicine

## 2016-01-25 VITALS — BP 128/80 | HR 90 | Temp 98.0°F | Resp 14 | Ht 70.0 in | Wt 244.0 lb

## 2016-01-25 DIAGNOSIS — Z125 Encounter for screening for malignant neoplasm of prostate: Secondary | ICD-10-CM | POA: Diagnosis not present

## 2016-01-25 DIAGNOSIS — R739 Hyperglycemia, unspecified: Secondary | ICD-10-CM

## 2016-01-25 DIAGNOSIS — R6882 Decreased libido: Secondary | ICD-10-CM

## 2016-01-25 DIAGNOSIS — F329 Major depressive disorder, single episode, unspecified: Secondary | ICD-10-CM

## 2016-01-25 DIAGNOSIS — N400 Enlarged prostate without lower urinary tract symptoms: Secondary | ICD-10-CM | POA: Diagnosis not present

## 2016-01-25 DIAGNOSIS — Z0001 Encounter for general adult medical examination with abnormal findings: Secondary | ICD-10-CM | POA: Diagnosis not present

## 2016-01-25 DIAGNOSIS — Z23 Encounter for immunization: Secondary | ICD-10-CM

## 2016-01-25 DIAGNOSIS — R5383 Other fatigue: Secondary | ICD-10-CM

## 2016-01-25 DIAGNOSIS — Z0000 Encounter for general adult medical examination without abnormal findings: Secondary | ICD-10-CM

## 2016-01-25 DIAGNOSIS — F418 Other specified anxiety disorders: Secondary | ICD-10-CM

## 2016-01-25 DIAGNOSIS — R399 Unspecified symptoms and signs involving the genitourinary system: Secondary | ICD-10-CM

## 2016-01-25 DIAGNOSIS — F419 Anxiety disorder, unspecified: Secondary | ICD-10-CM

## 2016-01-25 LAB — CBC WITH DIFFERENTIAL/PLATELET
BASOS PCT: 0.5 % (ref 0.0–3.0)
Basophils Absolute: 0 10*3/uL (ref 0.0–0.1)
Eosinophils Absolute: 0.2 10*3/uL (ref 0.0–0.7)
Eosinophils Relative: 2.8 % (ref 0.0–5.0)
HEMATOCRIT: 46.3 % (ref 39.0–52.0)
Hemoglobin: 16 g/dL (ref 13.0–17.0)
LYMPHS PCT: 31.6 % (ref 12.0–46.0)
Lymphs Abs: 2.5 10*3/uL (ref 0.7–4.0)
MCHC: 34.6 g/dL (ref 30.0–36.0)
MCV: 87.3 fl (ref 78.0–100.0)
MONOS PCT: 9.4 % (ref 3.0–12.0)
Monocytes Absolute: 0.8 10*3/uL (ref 0.1–1.0)
NEUTROS ABS: 4.5 10*3/uL (ref 1.4–7.7)
Neutrophils Relative %: 55.7 % (ref 43.0–77.0)
PLATELETS: 178 10*3/uL (ref 150.0–400.0)
RBC: 5.31 Mil/uL (ref 4.22–5.81)
RDW: 12.8 % (ref 11.5–15.5)
WBC: 8.1 10*3/uL (ref 4.0–10.5)

## 2016-01-25 LAB — COMPREHENSIVE METABOLIC PANEL
ALBUMIN: 4 g/dL (ref 3.5–5.2)
ALK PHOS: 88 U/L (ref 39–117)
ALT: 26 U/L (ref 0–53)
AST: 23 U/L (ref 0–37)
BILIRUBIN TOTAL: 0.6 mg/dL (ref 0.2–1.2)
BUN: 15 mg/dL (ref 6–23)
CALCIUM: 9.3 mg/dL (ref 8.4–10.5)
CO2: 29 mEq/L (ref 19–32)
CREATININE: 1.13 mg/dL (ref 0.40–1.50)
Chloride: 107 mEq/L (ref 96–112)
GFR: 70.77 mL/min (ref >60.00)
Glucose, Bld: 102 mg/dL — ABNORMAL HIGH (ref 70–99)
Potassium: 4.3 mEq/L (ref 3.5–5.1)
Sodium: 142 mEq/L (ref 135–145)
Total Protein: 6.8 g/dL (ref 6.0–8.3)

## 2016-01-25 LAB — URINALYSIS, ROUTINE W REFLEX MICROSCOPIC
BILIRUBIN URINE: NEGATIVE
Ketones, ur: NEGATIVE
LEUKOCYTES UA: NEGATIVE
NITRITE: NEGATIVE
Specific Gravity, Urine: 1.02 (ref 1.000–1.030)
TOTAL PROTEIN, URINE-UPE24: NEGATIVE
URINE GLUCOSE: NEGATIVE
Urobilinogen, UA: 0.2 (ref 0.0–1.0)
pH: 6 (ref 5.0–8.0)

## 2016-01-25 LAB — PSA: PSA: 3.34 ng/mL (ref 0.10–4.00)

## 2016-01-25 LAB — LIPID PANEL
CHOLESTEROL: 123 mg/dL (ref 0–200)
HDL: 35.4 mg/dL — ABNORMAL LOW (ref >39.00)
LDL Cholesterol: 56 mg/dL (ref 0–99)
NonHDL: 87.21
TRIGLYCERIDES: 157 mg/dL — AB (ref 0.0–149.0)
Total CHOL/HDL Ratio: 3
VLDL: 31.4 mg/dL (ref 0.0–40.0)

## 2016-01-25 LAB — HEMOGLOBIN A1C: Hgb A1c MFr Bld: 5.6 % (ref 4.6–6.5)

## 2016-01-25 LAB — FOLATE

## 2016-01-25 LAB — TSH: TSH: 1.17 u[IU]/mL (ref 0.35–4.50)

## 2016-01-25 LAB — VITAMIN B12: Vitamin B-12: 333 pg/mL (ref 211–911)

## 2016-01-25 MED ORDER — BUPROPION HCL 100 MG PO TABS: 100.0000 mg | ORAL_TABLET | Freq: Two times a day (BID) | ORAL | 1 refills | Status: DC

## 2016-01-25 NOTE — Patient Instructions (Signed)
GO TO THE LAB : Get the blood work     GO TO THE FRONT DESK Schedule your next appointment for a  Check up in 6 weeks   See a counselor  Add wellbutrin 100 mg: 1 tab a day x 2 weeks, then 2 tabs a day

## 2016-01-25 NOTE — Progress Notes (Signed)
Subjective:    Patient ID: Oscar Fuller, male    DOB: 1955/09/13, 60 y.o.   MRN: [id number]  DOS:  01/25/2016 Type of visit - description : CPX Interval history:  Complaining of lack of energy for several months: States his wife is telling him that his only interest is working and his grandchildren, the patient reports + stress, worried about many things. Has difficulty concentrating. When asked, admits to decreased libido and some ED for the last few months. Denies chest pain, dyspnea on exertion, edema. Uses the CPAP (consistently?).    Review of Systems  Constitutional: No fever. No chills. No unexplained wt changes. No unusual sweats  HEENT: No dental problems, no ear discharge, no facial swelling, no voice changes. No eye discharge, no eye  redness , no  intolerance to light   Respiratory: No wheezing , no  difficulty breathing. No cough , no mucus production  Cardiovascular: No CP, no leg swelling , no  Palpitations  GI: no nausea, no vomiting, no diarrhea , no  abdominal pain.  No blood in the stools. No dysphagia, no odynophagia    Endocrine: No polyphagia, no polyuria , no polydipsia  GU: No dysuria, gross hematuria, difficulty urinating. Admits to occasional urinary urgency, symptoms are not consistent.  + Nocturia at least one time every night.  Musculoskeletal: No joint swellings or unusual aches or pains  Skin: No change in the color of the skin, palor , no  Rash  Allergic, immunologic: No environmental allergies , no  food allergies  Neurological: No dizziness no  syncope. No headaches. No diplopia, no slurred, no slurred speech, no motor deficits, no facial  Numbness  Hematological: No enlarged lymph nodes, no easy bruising , no unusual bleedings  Psychiatry: No suicidal ideas, no hallucinations, no beavior problems, no confusion.    Past Medical History:  Diagnosis Date  . Anxiety   . Atypical chest pain    with ER visit in 8/11. ETT-myoview showed EF  61% no ischemia or infaraction 8/11  . Depression   . DVT (deep venous thrombosis) ([place])   . ED (erectile dysfunction) 07/2008   used some cialis, symptoms not active at present   . Hyperlipidemia   . Hypertension    was on ACEi-diuretics  . Increased prostate specific antigen (PSA) velocity    saw Dr Joelyn Oms, s/p Bx(-) 2002  . Prediabetes 09/07/2013  . Pulmonary embolism ([place]) 11-2013   PE and L DVT after a trip  . SCC (squamous cell carcinoma)    Dr Sherrye Payor, in the back  . Sleep apnea    HST 05/2012:  AHI 34/hr with desat as low as 69%.     Past Surgical History:  Procedure Laterality Date  . KNEE SURGERY  2009   left arthroscopy  . VASECTOMY      Social History   Social History  . Marital status: Married    Spouse name: N/A  . Number of children: 2  . Years of education: BS   Occupational History  . [employer]    Social History Main Topics  . Smoking status: Never Smoker  . Smokeless tobacco: Never Used  . Alcohol use No     Comment: occassional--rare  . Drug use: No  . Sexual activity: Not on file   Other Topics Concern  . Not on file   Social History Narrative   Married x 2  , 2nd wife [name] has a h/o SZ d/o  Family History  Problem Relation Age of Onset  . Coronary artery disease Father     stent 83, GF GM  . Hypertension Father     F B  . Lung cancer Father   . Glaucoma Mother   . Heart disease Mother   . Heart attack Brother     F B M GM  . Stroke Other     CVA GM  . Stomach cancer Paternal Grandfather   . Gout Brother   . Migraines Sister   . Colon cancer Neg Hx   . Prostate cancer Neg Hx        Medication List       Accurate as of 01/25/16  9:15 AM. Always use your most recent med list.          atorvastatin 40 MG tablet Commonly known as:  LIPITOR Take 1 tablet (40 mg total) by mouth daily.   rivaroxaban 10 MG Tabs tablet Commonly known as:  XARELTO Take 1 tablet (10 mg total) by mouth daily with supper.     sertraline 100 MG tablet Commonly known as:  ZOLOFT Take 1 tablet (100 mg total) by mouth daily.   zolpidem 10 MG tablet Commonly known as:  AMBIEN Take 1 tablet (10 mg total) by mouth at bedtime as needed for sleep.          Objective:   Physical Exam BP 128/80 (BP Location: Left Arm, Patient Position: Sitting, Cuff Size: Normal)   Pulse 90   Temp 98 F (36.7 C) (Oral)   Resp 14   Ht 5\' 10"  (1.778 m)   Wt 244 lb (110.7 kg)   SpO2 95%   BMI 35.01 kg/m   General:   Well developed, well nourished . NAD.  Neck: No  thyromegaly  HEENT:  Normocephalic . Face symmetric, atraumatic Lungs:  CTA B Normal respiratory effort, no intercostal retractions, no accessory muscle use. Heart: RRR,  no murmur.  No pretibial edema bilaterally  Abdomen:  Not distended, soft, non-tender. No rebound or rigidity.   Rectal:  External abnormalities: none. Normal sphincter tone. No rectal masses or tenderness.  Stool brown  Prostate: Prostate gland firm and smooth, + enlargement, no no dularity, tenderness, mass, asymmetry or induration.  Skin: Exposed areas without rash. Not pale. Not jaundice Neurologic:  alert & oriented X3.  Speech normal, gait appropriate for age and unassisted Strength symmetric and appropriate for age.  Psych: Cognition and judgment appear intact.  Cooperative with normal attention span and concentration.  Behavior appropriate. No anxious or depressed appearing.    Assessment & Plan:   Assessment > Prediabetes HTN Hyperlipidemia  depression, anxiety OSA 2014, Dr Halford Chessman Hematology Dr. a never, hypercoagulable studies --H/o  PE, DVT 11-2013 after a trip  --Thrombophlebitis 02-2015 chest pain : stress test no ischemia 2011 GU: BPH, LUTS, increased PSA velocity BX 2002  Plan  Prediabetes: Check in labs HTN: BP normal, on no medications. Fatigue: Complains of fatigue, decreased libido, a lot of stress at work, decreased concentration. DDX is large but  includes partially treated OSA, poor response to SSRIs, hypogonadism and others. Plan: Labs, see orders. Will include a testosterone level, be sure OSA is appropriately treated, treat depression. See below. Depression, anxiety: He c/o fatigue, the PHQ9 is 17, moderate depression.We had a long conversation about depression/anxiety, he probably needs to make some adjustments in his work to decrease his stress. Strongly encouraged to see a counselor, will add Wellbutrin, reassess in 6 weeks  BPH: BPH with L UTS, ongoing sx previously prescribed by me Vesicare and Flomax with poor response. Refer to urology Thrombophlebitis: Saw Dr Marin Olp [phone] rx maintenance  Xarelto for one year, 10 mg daily. RTC 6 weeks   In addition to CPX, more than 20 minutes were spent addressing fatigue, depression, BPH, treatment options and alternatives discussed with the patient. > 50% of the additional time counseling.

## 2016-01-25 NOTE — Assessment & Plan Note (Addendum)
Td 2013;  flu shot  today  colonoscopy  08/2008, tubular adenoma, colonoscopy 2015, next 2020 per GI letter Prostate cancer screening: DRE show mild enlargement, Checking a PSA  patient has a FH of CAD ---->stress test neg 12-2009 Diet-exercise-wt loss: discussed today   Labs: CMP, FLP, CBC, A1c, TSH, PSA, vitamin D, 123456, folic acid. UA and urine culture.

## 2016-01-25 NOTE — Progress Notes (Signed)
Pre visit review using our clinic review tool, if applicable. No additional management support is needed unless otherwise documented below in the visit note. 

## 2016-01-27 LAB — VITAMIN D 1,25 DIHYDROXY
VITAMIN D 1, 25 (OH) TOTAL: 31 pg/mL (ref 18–72)
Vitamin D2 1, 25 (OH)2: <8 pg/mL
Vitamin D3 1, 25 (OH)2: 31 pg/mL

## 2016-01-27 LAB — URINE CULTURE: ORGANISM ID, BACTERIA: NO GROWTH

## 2016-01-27 NOTE — Assessment & Plan Note (Signed)
Prediabetes: Check in labs HTN: BP normal, on no medications. Fatigue: Complains of fatigue, decreased libido, a lot of stress at work, decreased concentration. DDX is large but includes partially treated OSA, poor response to SSRIs, hypogonadism and others. Plan: Labs, see orders. Will include a testosterone level, be sure OSA is appropriately treated, treat depression. See below. Depression, anxiety: He c/o fatigue, the PHQ9 is 17, moderate depression.We had a long conversation about depression/anxiety, he probably needs to make some adjustments in his work to decrease his stress. Strongly encouraged to see a counselor, will add Wellbutrin, reassess in 6 weeks BPH: BPH with L UTS, ongoing sx previously prescribed by me Vesicare and Flomax with poor response. Refer to urology Thrombophlebitis: Saw Dr Marin Olp [phone] rx maintenance  Xarelto for one year, 10 mg daily. RTC 6 weeks

## 2016-01-28 LAB — TESTOS,TOTAL,FREE AND SHBG (FEMALE)
SEX HORMONE BINDING GLOB.: 32 nmol/L (ref 22–77)
TESTOSTERONE,FREE: 55.8 pg/mL (ref 35.0–155.0)
TESTOSTERONE,TOTAL,LC/MS/MS: 404 ng/dL (ref 250–1100)

## 2016-02-05 DIAGNOSIS — G4733 Obstructive sleep apnea (adult) (pediatric): Secondary | ICD-10-CM | POA: Diagnosis not present

## 2016-02-15 ENCOUNTER — Other Ambulatory Visit: Payer: Self-pay | Admitting: Internal Medicine

## 2016-03-07 ENCOUNTER — Other Ambulatory Visit: Payer: Self-pay

## 2016-03-07 MED ORDER — SERTRALINE HCL 100 MG PO TABS: 100.0000 mg | ORAL_TABLET | Freq: Every day | ORAL | 0 refills | Status: DC

## 2016-03-10 ENCOUNTER — Encounter: Payer: Self-pay | Admitting: Internal Medicine

## 2016-03-10 ENCOUNTER — Ambulatory Visit (INDEPENDENT_AMBULATORY_CARE_PROVIDER_SITE_OTHER): Payer: BLUE CROSS/BLUE SHIELD | Admitting: Internal Medicine

## 2016-03-10 ENCOUNTER — Other Ambulatory Visit (HOSPITAL_BASED_OUTPATIENT_CLINIC_OR_DEPARTMENT_OTHER): Payer: BLUE CROSS/BLUE SHIELD

## 2016-03-10 ENCOUNTER — Encounter: Payer: Self-pay | Admitting: Hematology & Oncology

## 2016-03-10 ENCOUNTER — Ambulatory Visit (HOSPITAL_BASED_OUTPATIENT_CLINIC_OR_DEPARTMENT_OTHER): Payer: BLUE CROSS/BLUE SHIELD | Admitting: Hematology & Oncology

## 2016-03-10 VITALS — BP 118/78 | HR 96 | Temp 98.6°F | Ht 69.0 in | Wt 247.0 lb

## 2016-03-10 VITALS — BP 150/84 | HR 103 | Temp 98.3°F | Wt 248.0 lb

## 2016-03-10 DIAGNOSIS — R5383 Other fatigue: Secondary | ICD-10-CM

## 2016-03-10 DIAGNOSIS — F418 Other specified anxiety disorders: Secondary | ICD-10-CM | POA: Diagnosis not present

## 2016-03-10 DIAGNOSIS — I82811 Embolism and thrombosis of superficial veins of right lower extremities: Secondary | ICD-10-CM

## 2016-03-10 DIAGNOSIS — Z7901 Long term (current) use of anticoagulants: Secondary | ICD-10-CM

## 2016-03-10 DIAGNOSIS — F329 Major depressive disorder, single episode, unspecified: Secondary | ICD-10-CM

## 2016-03-10 DIAGNOSIS — Z86711 Personal history of pulmonary embolism: Secondary | ICD-10-CM | POA: Diagnosis not present

## 2016-03-10 DIAGNOSIS — F419 Anxiety disorder, unspecified: Secondary | ICD-10-CM

## 2016-03-10 DIAGNOSIS — I82409 Acute embolism and thrombosis of unspecified deep veins of unspecified lower extremity: Secondary | ICD-10-CM

## 2016-03-10 DIAGNOSIS — F32A Depression, unspecified: Secondary | ICD-10-CM

## 2016-03-10 DIAGNOSIS — I82401 Acute embolism and thrombosis of unspecified deep veins of right lower extremity: Secondary | ICD-10-CM

## 2016-03-10 LAB — CBC WITH DIFFERENTIAL (CANCER CENTER ONLY)
BASO#: 0 10*3/uL (ref 0.0–0.2)
BASO%: 0.4 % (ref 0.0–2.0)
EOS%: 2.5 % (ref 0.0–7.0)
Eosinophils Absolute: 0.2 10*3/uL (ref 0.0–0.5)
HEMATOCRIT: 47.2 % (ref 38.7–49.9)
HEMOGLOBIN: 16.6 g/dL (ref 13.0–17.1)
LYMPH#: 2.6 10*3/uL (ref 0.9–3.3)
LYMPH%: 30.9 % (ref 14.0–48.0)
MCH: 30.5 pg (ref 28.0–33.4)
MCHC: 35.2 g/dL (ref 32.0–35.9)
MCV: 87 fL (ref 82–98)
MONO#: 0.6 10*3/uL (ref 0.1–0.9)
MONO%: 7.3 % (ref 0.0–13.0)
NEUT%: 58.9 % (ref 40.0–80.0)
NEUTROS ABS: 5.1 10*3/uL (ref 1.5–6.5)
Platelets: 181 10*3/uL (ref 145–400)
RBC: 5.44 10*6/uL (ref 4.20–5.70)
RDW: 13.1 % (ref 11.1–15.7)
WBC: 8.6 10*3/uL (ref 4.0–10.0)

## 2016-03-10 LAB — BASIC METABOLIC PANEL
Anion Gap: 8 mEq/L (ref 3–11)
BUN: 11.3 mg/dL (ref 7.0–26.0)
CALCIUM: 9.5 mg/dL (ref 8.4–10.4)
CHLORIDE: 108 meq/L (ref 98–109)
CO2: 28 meq/L (ref 22–29)
Creatinine: 1.3 mg/dL (ref 0.7–1.3)
EGFR: 60 mL/min/{1.73_m2} — AB (ref >90)
GLUCOSE: 99 mg/dL (ref 70–140)
POTASSIUM: 4.4 meq/L (ref 3.5–5.1)
SODIUM: 144 meq/L (ref 136–145)

## 2016-03-10 NOTE — Progress Notes (Signed)
Subjective:    Patient ID: Oscar Fuller, male    DOB: Mar 20, 1956, 60 y.o.   MRN: [id number]  DOS:  03/10/2016 Type of visit - description : Follow-up Interval history: He was seen the last time for a CPX, he also complained of severe fatigue and some anxiety. Wellbutrin was added. Emotionally he feels that the medicine had a positive impact, more "mellow" and less stressed. Fatigue however has not changed, he wakes up okay and as the day goes by he gets tired, could fall asleep after 10 minutes of inactivity. Not taking Ambien, falling asleep has not been a issue   Review of Systems   Past Medical History:  Diagnosis Date  . Anxiety   . Atypical chest pain    with ER visit in 8/11. ETT-myoview showed EF 61% no ischemia or infaraction 8/11  . Depression   . DVT (deep venous thrombosis) ([place])   . ED (erectile dysfunction) 07/2008   used some cialis, symptoms not active at present   . Hyperlipidemia   . Hypertension    was on ACEi-diuretics  . Increased prostate specific antigen (PSA) velocity    saw Dr Joelyn Oms, s/p Bx(-) 2002  . Prediabetes 09/07/2013  . Pulmonary embolism ([place]) 11-2013   PE and L DVT after a trip  . SCC (squamous cell carcinoma)    Dr Sherrye Payor, in the back  . Sleep apnea    HST 05/2012:  AHI 34/hr with desat as low as 69%.     Past Surgical History:  Procedure Laterality Date  . KNEE SURGERY  2009   left arthroscopy  . VASECTOMY      Social History   Social History  . Marital status: Married    Spouse name: [name]  . Number of children: 2  . Years of education: BS   Occupational History  . [employer]    Social History Main Topics  . Smoking status: Never Smoker  . Smokeless tobacco: Never Used  . Alcohol use No     Comment:    . Drug use: No  . Sexual activity: Not on file   Other Topics Concern  . Not on file   Social History Narrative   Married x 2  , 2nd wife [name] has a h/o SZ d/o               Medication List       Accurate  as of 03/10/16 11:33 AM. Always use your most recent med list.          atorvastatin 40 MG tablet Commonly known as:  LIPITOR Take 1 tablet (40 mg total) by mouth daily.   buPROPion 100 MG tablet Commonly known as:  WELLBUTRIN Take 1 tablet (100 mg total) by mouth 2 (two) times daily.   rivaroxaban 10 MG Tabs tablet Commonly known as:  XARELTO Take 1 tablet (10 mg total) by mouth daily with supper.   sertraline 100 MG tablet Commonly known as:  ZOLOFT Take 1 tablet (100 mg total) by mouth daily.   zolpidem 10 MG tablet Commonly known as:  AMBIEN Take 1 tablet (10 mg total) by mouth at bedtime as needed for sleep.          Objective:   Physical Exam BP 118/78 (BP Location: Left Arm, Patient Position: Sitting, Cuff Size: Large)   Pulse 96   Temp 98.6 F (37 C) (Oral)   Ht 5\' 9"  (1.753 m)   Wt 247 lb (112 kg)  SpO2 97%   BMI 36.48 kg/m  General:   Well developed, obese gentleman in no distress.  HEENT:  Normocephalic . Face symmetric, atraumatic Skin: Not pale. Not jaundice Neurologic:  alert & oriented X3.  Speech normal, gait appropriate for age and unassisted Psych--  Cognition and judgment appear intact.  Cooperative with normal attention span and concentration.  Behavior appropriate. No anxious or depressed appearing.      Assessment & Plan:  Assessment > Prediabetes HTN Hyperlipidemia  depression, anxiety OSA 2014, Dr Halford Chessman Hematology Dr. a never, hypercoagulable studies --H/o  PE, DVT 11-2013 after a trip  --Thrombophlebitis 02-2015 chest pain : stress test no ischemia 2011 GU: BPH, LUTS, increased PSA velocity BX 2002  Plan  Fatigue: Ongoing, workup has included CBC, testost,A1c, TSH, vitamins  ---> all OK. We have optimized anxiety/depression treatment and emotionally he feels subjectively better  Etiology not clear, could be partially treated OSA? Will send a message to Dr. Halford Chessman,  suggestions? Recheck CPAP settings?. Also will consider  further w/u. Depression anxiety: Slightly better.  RTC 3 months.

## 2016-03-10 NOTE — Progress Notes (Addendum)
Hematology and Oncology Follow Up Visit  Tyresse Makowski Reindel [id number] [birth year] 60 y.o. 03/10/2016   Principle Diagnosis:   Thrombus of the right greater saphenous vein. Has history of pulmonary embolism  Factor V Leiden - Heterozygous  Current Therapy:    Xarelto 10 mg by mouth daily     Interim History:  Mr. Tray is back for follow-up. He do well. He's not complaining of any pain or swelling in the right leg. He's had no cough. His had no shortness of breath. There's been no bleeding. He currently is on 20 mg a of Xarelto. I think we should put him on Xarelto at 10 mg a day.  We did go ahead and repeat a Doppler of his right leg. This showed no DVT. He has a persistent thrombus in the right greater saphenous vein.  We did repeat the Factor V  Leiden study. Surprising enough, he is heterozygous for the mutation.  He is traveling. He's having no problems with leg swelling. He is exercising. It would be nice if he did lose a little weight. I'm sure that he will be trying for this.  He had a pretty decent summer. He didn't do a whole lot. He is looking forward to the holidays coming up.  He's had no nausea or vomiting. He's had no rashes. He's had no fatigue or weakness.  Overall, his performance status is ECOG 0.  Medications:  Current Outpatient Prescriptions:  .  atorvastatin (LIPITOR) 40 MG tablet, Take 1 tablet (40 mg total) by mouth daily., Disp: 90 tablet, Rfl: 1 .  buPROPion (WELLBUTRIN) 100 MG tablet, Take 1 tablet (100 mg total) by mouth 2 (two) times daily., Disp: 60 tablet, Rfl: 1 .  rivaroxaban (XARELTO) 10 MG TABS tablet, Take 1 tablet (10 mg total) by mouth daily with supper., Disp: 30 tablet, Rfl: 12 .  sertraline (ZOLOFT) 100 MG tablet, Take 1 tablet (100 mg total) by mouth daily., Disp: 90 tablet, Rfl: 0  Allergies: No Known Allergies  Past Medical History, Surgical history, Social history, and Family History were reviewed and updated.  Review of Systems: As  above  Physical Exam:  weight is 248 lb (112.5 kg). His oral temperature is 98.3 F (36.8 C). His blood pressure is 150/84 (abnormal) and his pulse is 103 (abnormal).   Wt Readings from Last 3 Encounters:  03/10/16 248 lb (112.5 kg)  03/10/16 247 lb (112 kg)  01/25/16 244 lb (110.7 kg)     Well-developed well-nourished white male in no obvious distress. Head and neck exam shows no ocular or oral lesions. He has no palpable cervical or supraclavicular lymph nodes. Lungs are clear. Cardiac exam regular rate and rhythm with no murmurs, rubs or bruits. Abdomen is soft. Has good bowel sounds. He has no fluid wave. There is no palpable liver or spleen tip. Extremities shows no clubbing, cyanosis or edema. There may be some slight nonpitting edema of the right lower leg. He has negative Homans sign. He has good range of motion of his joints. Neurological exam shows no focal neurological deficits.  Lab Results  Component Value Date   WBC 8.6 03/10/2016   HGB 16.6 03/10/2016   HCT 47.2 03/10/2016   MCV 87 03/10/2016   PLT 181 03/10/2016     Chemistry      Component Value Date/Time   NA 142 01/25/2016 1019   K 4.3 01/25/2016 1019   CL 107 01/25/2016 1019   CO2 29 01/25/2016 1019   BUN  15 01/25/2016 1019   CREATININE 1.13 01/25/2016 1019      Component Value Date/Time   CALCIUM 9.3 01/25/2016 1019   ALKPHOS 88 01/25/2016 1019   AST 23 01/25/2016 1019   ALT 26 01/25/2016 1019   BILITOT 0.6 01/25/2016 1019         Impression and Plan: Mr. Siemon is A 60 year old male.Marland KitchenHe actually has a factor V Leiden mutation. He is heterozygous for this.  We will continue him on low-dose Xarelto until April 2018. At that point, we will see him back. We'll see if we have to continue him on that or get him off anticoagulation totally maybe get him on aspirin. Marland Kitchen   Volanda Napoleon, MD 10/30/20172:08 PM

## 2016-03-10 NOTE — Patient Instructions (Signed)
  GO TO THE FRONT DESK Schedule your next appointment for a  Check up in 3 months  

## 2016-03-10 NOTE — Progress Notes (Signed)
Pre visit review using our clinic review tool, if applicable. No additional management support is needed unless otherwise documented below in the visit note. 

## 2016-03-11 ENCOUNTER — Other Ambulatory Visit: Payer: Self-pay | Admitting: Pulmonary Disease

## 2016-03-11 DIAGNOSIS — G4733 Obstructive sleep apnea (adult) (pediatric): Secondary | ICD-10-CM

## 2016-03-11 DIAGNOSIS — Z9989 Dependence on other enabling machines and devices: Principal | ICD-10-CM

## 2016-03-11 LAB — D-DIMER, QUANTITATIVE (NOT AT ARMC): D-DIMER: 0.32 mg{FEU}/L (ref 0.00–0.49)

## 2016-03-11 NOTE — Assessment & Plan Note (Signed)
Fatigue: Ongoing, workup has included CBC, testost,A1c, TSH, vitamins  ---> all OK. We have optimized anxiety/depression treatment and emotionally he feels subjectively better  Etiology not clear, could be partially treated OSA? Will send a message to Dr. Halford Chessman,  suggestions? Recheck CPAP settings?. Also will consider further w/u. Depression anxiety: Slightly better.  RTC 3 months.

## 2016-03-11 NOTE — Progress Notes (Signed)
He is having persistent daytime sleepiness and reported this to his PCP.  Will get copy of his CPAP download to determine if he needs adjustment to CPAP setting.

## 2016-03-12 ENCOUNTER — Other Ambulatory Visit: Payer: Self-pay | Admitting: Internal Medicine

## 2016-03-14 LAB — FACTOR 5 LEIDEN

## 2016-03-24 DIAGNOSIS — R972 Elevated prostate specific antigen [PSA]: Secondary | ICD-10-CM | POA: Diagnosis not present

## 2016-04-04 ENCOUNTER — Other Ambulatory Visit: Payer: Self-pay | Admitting: Internal Medicine

## 2016-04-04 ENCOUNTER — Other Ambulatory Visit: Payer: Self-pay | Admitting: *Deleted

## 2016-04-04 ENCOUNTER — Encounter: Payer: Self-pay | Admitting: Hematology & Oncology

## 2016-04-04 DIAGNOSIS — I82409 Acute embolism and thrombosis of unspecified deep veins of unspecified lower extremity: Secondary | ICD-10-CM

## 2016-04-04 MED ORDER — RIVAROXABAN 10 MG PO TABS: 10.0000 mg | ORAL_TABLET | Freq: Every day | ORAL | 12 refills | Status: DC

## 2016-04-07 MED ORDER — SERTRALINE HCL 100 MG PO TABS: 100.0000 mg | ORAL_TABLET | Freq: Every day | ORAL | 1 refills | Status: DC

## 2016-04-16 ENCOUNTER — Other Ambulatory Visit: Payer: Self-pay | Admitting: Internal Medicine

## 2016-05-09 ENCOUNTER — Encounter: Payer: Self-pay | Admitting: Internal Medicine

## 2016-05-09 ENCOUNTER — Other Ambulatory Visit: Payer: Self-pay | Admitting: Internal Medicine

## 2016-05-09 MED ORDER — ATORVASTATIN CALCIUM 40 MG PO TABS: 40.0000 mg | ORAL_TABLET | Freq: Every day | ORAL | 1 refills | Status: DC

## 2016-06-10 ENCOUNTER — Ambulatory Visit (INDEPENDENT_AMBULATORY_CARE_PROVIDER_SITE_OTHER): Payer: BLUE CROSS/BLUE SHIELD | Admitting: Internal Medicine

## 2016-06-10 ENCOUNTER — Encounter: Payer: Self-pay | Admitting: Internal Medicine

## 2016-06-10 VITALS — BP 124/68 | HR 94 | Temp 98.0°F | Resp 14 | Ht 69.0 in | Wt 251.1 lb

## 2016-06-10 DIAGNOSIS — F329 Major depressive disorder, single episode, unspecified: Secondary | ICD-10-CM

## 2016-06-10 DIAGNOSIS — F418 Other specified anxiety disorders: Secondary | ICD-10-CM | POA: Diagnosis not present

## 2016-06-10 DIAGNOSIS — F419 Anxiety disorder, unspecified: Principal | ICD-10-CM

## 2016-06-10 DIAGNOSIS — R5383 Other fatigue: Secondary | ICD-10-CM | POA: Diagnosis not present

## 2016-06-10 DIAGNOSIS — F32A Depression, unspecified: Secondary | ICD-10-CM

## 2016-06-10 MED ORDER — ZOLPIDEM TARTRATE 10 MG PO TABS: 5.0000 mg | ORAL_TABLET | Freq: Every evening | ORAL | 0 refills | Status: DC | PRN

## 2016-06-10 NOTE — Progress Notes (Signed)
Pre visit review using our clinic review tool, if applicable. No additional management support is needed unless otherwise documented below in the visit note. 

## 2016-06-10 NOTE — Patient Instructions (Signed)
   GO TO THE FRONT DESK Schedule your next appointment for a  Check up in 6 months  

## 2016-06-10 NOTE — Progress Notes (Signed)
Subjective:    Patient ID: Oscar Fuller, male    DOB: 1955-07-30, 61 y.o.   MRN: [id number]  DOS:  06/10/2016 Type of visit - description : Follow-up from previous visit Interval history: Good medication compliance, not taking Ambien. Still has some difficulty sleeping and waking up in the middle of the night and has a hard time going back to sleep Still having issues keeping the CPAP mask all night.  Review of Systems Denies suicidal ideas  Past Medical History:  Diagnosis Date  . Anxiety   . Atypical chest pain    with ER visit in 8/11. ETT-myoview showed EF 61% no ischemia or infaraction 8/11  . Depression   . DVT (deep venous thrombosis) ([place])   . ED (erectile dysfunction) 07/2008   used some cialis, symptoms not active at present   . Hyperlipidemia   . Hypertension    was on ACEi-diuretics  . Increased prostate specific antigen (PSA) velocity    saw Dr Joelyn Oms, s/p Bx(-) 2002  . Prediabetes 09/07/2013  . Pulmonary embolism ([place]) 11-2013   PE and L DVT after a trip  . SCC (squamous cell carcinoma)    Dr Sherrye Payor, in the back  . Sleep apnea    HST 05/2012:  AHI 34/hr with desat as low as 69%.     Past Surgical History:  Procedure Laterality Date  . KNEE SURGERY  2009   left arthroscopy  . VASECTOMY      Social History   Social History  . Marital status: Married    Spouse name: [name]  . Number of children: 2  . Years of education: BS   Occupational History  . [employer]    Social History Main Topics  . Smoking status: Never Smoker  . Smokeless tobacco: Never Used  . Alcohol use No     Comment:    . Drug use: No  . Sexual activity: Not on file   Other Topics Concern  . Not on file   Social History Narrative   Married x 2  , 2nd wife [name] has a h/o SZ d/o             Allergies as of 06/10/2016   No Known Allergies     Medication List       Accurate as of 06/10/16 11:59 PM. Always use your most recent med list.          atorvastatin 40 MG  tablet Commonly known as:  LIPITOR Take 1 tablet (40 mg total) by mouth daily.   buPROPion 100 MG tablet Commonly known as:  WELLBUTRIN Take 1 tablet (100 mg total) by mouth 2 (two) times daily.   rivaroxaban 10 MG Tabs tablet Commonly known as:  XARELTO Take 1 tablet (10 mg total) by mouth daily with supper.   sertraline 100 MG tablet Commonly known as:  ZOLOFT Take 1 tablet (100 mg total) by mouth daily.   zolpidem 10 MG tablet Commonly known as:  AMBIEN Take 0.5-1 tablets (5-10 mg total) by mouth at bedtime as needed for sleep.          Objective:   Physical Exam BP 124/68 (BP Location: Left Arm, Patient Position: Sitting, Cuff Size: Normal)   Pulse 94   Temp 98 F (36.7 C) (Oral)   Resp 14   Ht 5\' 9"  (1.753 m)   Wt 251 lb 2 oz (113.9 kg)   SpO2 98%   BMI 37.08 kg/m  General:  Well developed, overweight appearing . NAD.  HEENT:  Normocephalic . Face symmetric, atraumatic Lungs:  CTA B Normal respiratory effort, no intercostal retractions, no accessory muscle use. Heart: RRR,  no murmur.  No pretibial edema bilaterally  Skin: Not pale. Not jaundice Neurologic:  alert & oriented X3.  Speech normal, gait appropriate for age and unassisted Psych--  Cognition and judgment appear intact.  Cooperative with normal attention span and concentration.  Behavior appropriate. No anxious or depressed appearing.      Assessment & Plan:   Assessment > Prediabetes HTN Hyperlipidemia  depression, anxiety OSA 2014, Dr Halford Chessman Hematology Dr. a never, hypercoagulable studies --H/o  PE, DVT 11-2013 after a trip  --Thrombophlebitis 02-2015 chest pain : stress test no ischemia 2011 GU: BPH, LUTS, increased PSA velocity BX 2002  Plan  Fatigue: doing about the same or slightly better. Rec observation Depression anxiety insomnia: Slightly better, no major issues, he feels is burn out from work and frustrated about the political environment in the country; we talk  about  possibly increase his medication but he declined, will call when ready. Does not like to do counseling. We agreed to have some Ambien to use as needed only PE DVT: We'll see hematology this year RTC 6 months

## 2016-06-11 NOTE — Assessment & Plan Note (Signed)
Fatigue: doing about the same or slightly better. Rec observation Depression anxiety insomnia: Slightly better, no major issues, he feels is burn out from work and frustrated about the political environment in the country; we talk  about possibly increase his medication but he declined, will call when ready. Does not like to do counseling. We agreed to have some Ambien to use as needed only PE DVT: We'll see hematology this year RTC 6 months

## 2016-09-08 ENCOUNTER — Ambulatory Visit (HOSPITAL_BASED_OUTPATIENT_CLINIC_OR_DEPARTMENT_OTHER): Payer: BLUE CROSS/BLUE SHIELD | Admitting: Hematology & Oncology

## 2016-09-08 ENCOUNTER — Other Ambulatory Visit (HOSPITAL_BASED_OUTPATIENT_CLINIC_OR_DEPARTMENT_OTHER): Payer: BLUE CROSS/BLUE SHIELD

## 2016-09-08 VITALS — BP 134/94 | HR 102 | Temp 97.8°F | Resp 17 | Wt 246.8 lb

## 2016-09-08 DIAGNOSIS — Z7901 Long term (current) use of anticoagulants: Secondary | ICD-10-CM | POA: Diagnosis not present

## 2016-09-08 DIAGNOSIS — D6851 Activated protein C resistance: Secondary | ICD-10-CM

## 2016-09-08 DIAGNOSIS — Z86711 Personal history of pulmonary embolism: Secondary | ICD-10-CM

## 2016-09-08 DIAGNOSIS — I82401 Acute embolism and thrombosis of unspecified deep veins of right lower extremity: Secondary | ICD-10-CM | POA: Diagnosis not present

## 2016-09-08 DIAGNOSIS — Z86718 Personal history of other venous thrombosis and embolism: Secondary | ICD-10-CM | POA: Diagnosis not present

## 2016-09-08 DIAGNOSIS — I2699 Other pulmonary embolism without acute cor pulmonale: Secondary | ICD-10-CM

## 2016-09-08 LAB — CBC WITH DIFFERENTIAL (CANCER CENTER ONLY)
BASO#: 0 10*3/uL (ref 0.0–0.2)
BASO%: 0.4 % (ref 0.0–2.0)
EOS ABS: 0.2 10*3/uL (ref 0.0–0.5)
EOS%: 2.6 % (ref 0.0–7.0)
HCT: 46.6 % (ref 38.7–49.9)
HGB: 16.1 g/dL (ref 13.0–17.1)
LYMPH#: 2.2 10*3/uL (ref 0.9–3.3)
LYMPH%: 27.7 % (ref 14.0–48.0)
MCH: 30.3 pg (ref 28.0–33.4)
MCHC: 34.5 g/dL (ref 32.0–35.9)
MCV: 88 fL (ref 82–98)
MONO#: 0.8 10*3/uL (ref 0.1–0.9)
MONO%: 10.4 % (ref 0.0–13.0)
NEUT#: 4.8 10*3/uL (ref 1.5–6.5)
NEUT%: 58.9 % (ref 40.0–80.0)
PLATELETS: 176 10*3/uL (ref 145–400)
RBC: 5.31 10*6/uL (ref 4.20–5.70)
RDW: 12.9 % (ref 11.1–15.7)
WBC: 8.1 10*3/uL (ref 4.0–10.0)

## 2016-09-08 NOTE — Progress Notes (Signed)
Hematology and Oncology Follow Up Visit  Oscar Fuller [id number] 09-Mar-1956 61 y.o. 09/08/2016   Principle Diagnosis:   Thrombus of the right greater saphenous vein. Has history of pulmonary embolism  Factor V Leiden - Heterozygous  Current Therapy:   Xarelto 10 mg by mouth daily - complete this today. Aspirin 162 mg by mouth daily     Interim History:  Oscar Fuller is back for follow-up. He is doing okay. He has had no complaints. There has been no issues with respect to the low-dose Xarelto. I think we can get him off Xarelto now and get him on low-dose aspirin at 162 mg.  He has had no cough or shortness of breath. He's had no issues with leg swelling. He's had no change in bowel or bladder habits. He's had no rashes.  He is planning on going to the beach for part of the summer. He has a RV. I told make sure he drinks a lot of water and that he wears sunscreen.   He is still working.     He had no problems with influenza over the wintertime.   Overall, his performance status is ECOG 0.  Medications:  Current Outpatient Prescriptions:  .  atorvastatin (LIPITOR) 40 MG tablet, Take 1 tablet (40 mg total) by mouth daily., Disp: 90 tablet, Rfl: 1 .  buPROPion (WELLBUTRIN) 100 MG tablet, Take 1 tablet (100 mg total) by mouth 2 (two) times daily., Disp: 60 tablet, Rfl: 2 .  rivaroxaban (XARELTO) 10 MG TABS tablet, Take 1 tablet (10 mg total) by mouth daily with supper., Disp: 30 tablet, Rfl: 12 .  sertraline (ZOLOFT) 100 MG tablet, Take 1 tablet (100 mg total) by mouth daily., Disp: 90 tablet, Rfl: 1 .  zolpidem (AMBIEN) 10 MG tablet, Take 0.5-1 tablets (5-10 mg total) by mouth at bedtime as needed for sleep., Disp: 30 tablet, Rfl: 0  Allergies: No Known Allergies  Past Medical History, Surgical history, Social history, and Family History were reviewed and updated.  Review of Systems: As above  Physical Exam:  weight is 246 lb 12.8 oz (111.9 kg). His oral temperature is 97.8 F  (36.6 C). His blood pressure is 134/94 (abnormal) and his pulse is 102 (abnormal). His respiration is 17 and oxygen saturation is 96%.   Wt Readings from Last 3 Encounters:  09/08/16 246 lb 12.8 oz (111.9 kg)  06/10/16 251 lb 2 oz (113.9 kg)  03/10/16 248 lb (112.5 kg)     Well-developed well-nourished white male in no obvious distress. Head and neck exam shows no ocular or oral lesions. He has no palpable cervical or supraclavicular lymph nodes. Lungs are clear. Cardiac exam regular rate and rhythm with no murmurs, rubs or bruits. Abdomen is soft. Has good bowel sounds. He has no fluid wave. There is no palpable liver or spleen tip. Extremities shows no clubbing, cyanosis or edema. There may be some slight nonpitting edema of the right lower leg. He has negative Homans sign. He has good range of motion of his joints. Neurological exam shows no focal neurological deficits.  Lab Results  Component Value Date   WBC 8.1 09/08/2016   HGB 16.1 09/08/2016   HCT 46.6 09/08/2016   MCV 88 09/08/2016   PLT 176 09/08/2016     Chemistry      Component Value Date/Time   NA 144 03/10/2016 1312   K 4.4 03/10/2016 1312   CL 107 01/25/2016 1019   CO2 28 03/10/2016 1312  BUN 11.3 03/10/2016 1312   CREATININE 1.3 03/10/2016 1312      Component Value Date/Time   CALCIUM 9.5 03/10/2016 1312   ALKPHOS 88 01/25/2016 1019   AST 23 01/25/2016 1019   ALT 26 01/25/2016 1019   BILITOT 0.6 01/25/2016 1019         Impression and Plan: Oscar Fuller is A 61 year old male.Marland KitchenHe actually has a factor V Leiden mutation. He is heterozygous for this.  I will get him back on aspirin. He'll take 2 baby aspirin a day. He has completed the year of low-dose Xarelto. He has done well.  I will see him back in 6 months. If all looks good, then we will let him go from the clinic.  Volanda Napoleon, MD 4/30/201812:56 PM

## 2016-09-09 LAB — D-DIMER, QUANTITATIVE: D-DIMER: 0.37 mg/L FEU (ref 0.00–0.49)

## 2016-10-16 ENCOUNTER — Other Ambulatory Visit: Payer: Self-pay | Admitting: Internal Medicine

## 2016-11-02 ENCOUNTER — Other Ambulatory Visit: Payer: Self-pay | Admitting: Internal Medicine

## 2016-11-17 ENCOUNTER — Other Ambulatory Visit: Payer: Self-pay | Admitting: Internal Medicine

## 2016-12-08 ENCOUNTER — Encounter: Payer: Self-pay | Admitting: Internal Medicine

## 2016-12-08 ENCOUNTER — Ambulatory Visit (INDEPENDENT_AMBULATORY_CARE_PROVIDER_SITE_OTHER): Payer: BLUE CROSS/BLUE SHIELD | Admitting: Internal Medicine

## 2016-12-08 VITALS — BP 127/84 | HR 80 | Temp 98.1°F | Ht 69.0 in | Wt 244.4 lb

## 2016-12-08 DIAGNOSIS — E6609 Other obesity due to excess calories: Secondary | ICD-10-CM

## 2016-12-08 DIAGNOSIS — R5383 Other fatigue: Secondary | ICD-10-CM

## 2016-12-08 DIAGNOSIS — G4733 Obstructive sleep apnea (adult) (pediatric): Secondary | ICD-10-CM | POA: Diagnosis not present

## 2016-12-08 DIAGNOSIS — Z6836 Body mass index (BMI) 36.0-36.9, adult: Secondary | ICD-10-CM | POA: Diagnosis not present

## 2016-12-08 NOTE — Assessment & Plan Note (Signed)
OSA: Reports good CPAP compliance, saw pulmonology 11-2015, reports CPAP was checked and everything was okay. Hypercoagulable state: Saw hematology 08-2016, Xarelto change to aspirin. Doing well. Very aware of PE risk;  takes frequent walks if driving > 2 hours and uses compression stocking on long drives. Fatigue: Going on for more at least  a year, does not affect his job performance or family life. ROS (-);  Labs 01-2016 including vitamins and testosterone levels were okay. Chronic problems seem control. Rx observation. Obesity: Diet and exercise advice RTC 3-4 months, CPX

## 2016-12-08 NOTE — Patient Instructions (Signed)
    GO TO THE FRONT DESK Schedule your next appointment for a  Physical exam

## 2016-12-08 NOTE — Progress Notes (Signed)
Subjective:    Patient ID: Oscar Fuller, male    DOB: 21-Sep-1955, 61 y.o.   MRN: [id number]  DOS:  12/08/2016 Type of visit - description : rov Interval history: No major concerns. Anxiety depression: Good compliance w/ medication, symptoms seem controlled OSA: On a CPAP, good compliance Note from hematology reviewed. Continue complained of fatigue described as generalized lack of energy.   Review of Systems Denies chest pain, difficulty breathing; no lower extremity edema, palpitations. No dyspnea on exertion.    Past Medical History:  Diagnosis Date  . Anxiety   . Atypical chest pain    with ER visit in 8/11. ETT-myoview showed EF 61% no ischemia or infaraction 8/11  . Depression   . DVT (deep venous thrombosis) ([place])   . ED (erectile dysfunction) 07/2008   used some cialis, symptoms not active at present   . Hyperlipidemia   . Hypertension    was on ACEi-diuretics  . Increased prostate specific antigen (PSA) velocity    saw Dr Oscar Fuller, s/p Bx(-) 2002  . Prediabetes 09/07/2013  . Pulmonary embolism ([place]) 11-2013   PE and L DVT after a trip  . SCC (squamous cell carcinoma)    Dr Oscar Fuller, in the back  . Sleep apnea    HST 05/2012:  AHI 34/hr with desat as low as 69%.     Past Surgical History:  Procedure Laterality Date  . KNEE SURGERY  2009   left arthroscopy  . VASECTOMY      Social History   Social History  . Marital status: Married    Spouse name: Oscar Fuller  . Number of children: 2  . Years of education: BS   Occupational History  . [employer]    Social History Main Topics  . Smoking status: Never Smoker  . Smokeless tobacco: Never Used  . Alcohol use No     Comment:    . Drug use: No  . Sexual activity: Not on file   Other Topics Concern  . Not on file   Social History Narrative   Married x 2  , 2nd wife Oscar Fuller has a h/o SZ d/o             Allergies as of 12/08/2016   No Known Allergies     Medication List       Accurate as of 12/08/16   9:22 PM. Always use your most recent med list.          aspirin EC 81 MG tablet Take 162 mg by mouth daily.   atorvastatin 40 MG tablet Commonly known as:  LIPITOR Take 1 tablet (40 mg total) by mouth daily.   buPROPion 100 MG tablet Commonly known as:  WELLBUTRIN Take 1 tablet (100 mg total) by mouth 2 (two) times daily.   sertraline 100 MG tablet Commonly known as:  ZOLOFT Take 1 tablet (100 mg total) by mouth daily.   zolpidem 10 MG tablet Commonly known as:  AMBIEN Take 0.5-1 tablets (5-10 mg total) by mouth at bedtime as needed for sleep.          Objective:   Physical Exam BP 127/84 (BP Location: Left Arm, Patient Position: Sitting, Cuff Size: Normal)   Pulse 80   Temp 98.1 F (36.7 C) (Oral)   Ht 5\' 9"  (1.753 m)   Wt 244 lb 6 oz (110.8 kg)   SpO2 96%   BMI 36.09 kg/m  General:   Well developed, overweight appearing . NAD.  HEENT:  Normocephalic . Face symmetric, atraumatic Lungs:  CTA B Normal respiratory effort, no intercostal retractions, no accessory muscle use. Heart: RRR,  no murmur.  no pretibial edema bilaterally  Abdomen:  Not distended, soft, non-tender. No rebound or rigidity.  Skin: Not pale. Not jaundice Neurologic:  alert & oriented X3.  Speech normal, gait appropriate for age and unassisted Psych--  Cognition and judgment appear intact.  Cooperative with normal attention span and concentration.  Behavior appropriate. No anxious or depressed appearing.    Assessment & Plan:  Assessment > Prediabetes HTN Hyperlipidemia  depression, anxiety OSA 2014, Dr Oscar Fuller Hematology Dr.Ennever, heterozygous for factor V Leiden --H/o  PE, DVT 11-2013 after a trip  --Thrombophlebitis 02-2015 -- off xarelto, on ASA 162 since 08-2016 CP:  stress test no ischemia 2011 GU: BPH, LUTS, increased PSA velocity BX 2002  Plan  OSA: Reports good CPAP compliance, saw pulmonology 11-2015, reports CPAP was checked and everything was okay. Hypercoagulable  state: Saw hematology 08-2016, Xarelto change to aspirin. Doing well. Very aware of PE risk;  takes frequent walks if driving > 2 hours and uses compression stocking on long drives. Fatigue: Going on for more at least  a year, does not affect his job performance or family life. ROS (-);  Labs 01-2016 including vitamins and testosterone levels were okay. Chronic problems seem control. Rx observation. Obesity: Diet and exercise advice RTC 3-4 months, CPX

## 2016-12-08 NOTE — Progress Notes (Signed)
Pre visit review using our clinic review tool, if applicable. No additional management support is needed unless otherwise documented below in the visit note. 

## 2017-02-09 ENCOUNTER — Encounter: Payer: BLUE CROSS/BLUE SHIELD | Admitting: Internal Medicine

## 2017-02-16 ENCOUNTER — Other Ambulatory Visit: Payer: Self-pay | Admitting: Internal Medicine

## 2017-03-09 ENCOUNTER — Other Ambulatory Visit (HOSPITAL_BASED_OUTPATIENT_CLINIC_OR_DEPARTMENT_OTHER): Payer: BLUE CROSS/BLUE SHIELD

## 2017-03-09 ENCOUNTER — Ambulatory Visit (HOSPITAL_BASED_OUTPATIENT_CLINIC_OR_DEPARTMENT_OTHER): Payer: BLUE CROSS/BLUE SHIELD | Admitting: Hematology & Oncology

## 2017-03-09 VITALS — BP 120/92 | HR 72 | Temp 98.4°F | Resp 16 | Wt 241.0 lb

## 2017-03-09 DIAGNOSIS — D6851 Activated protein C resistance: Secondary | ICD-10-CM | POA: Diagnosis not present

## 2017-03-09 DIAGNOSIS — I2699 Other pulmonary embolism without acute cor pulmonale: Secondary | ICD-10-CM | POA: Diagnosis not present

## 2017-03-09 DIAGNOSIS — Z86718 Personal history of other venous thrombosis and embolism: Secondary | ICD-10-CM

## 2017-03-09 DIAGNOSIS — Z86711 Personal history of pulmonary embolism: Secondary | ICD-10-CM | POA: Diagnosis not present

## 2017-03-09 DIAGNOSIS — I82629 Acute embolism and thrombosis of deep veins of unspecified upper extremity: Secondary | ICD-10-CM

## 2017-03-09 LAB — CBC WITH DIFFERENTIAL (CANCER CENTER ONLY)
BASO#: 0 10*3/uL (ref 0.0–0.2)
BASO%: 0.5 % (ref 0.0–2.0)
EOS ABS: 0.3 10*3/uL (ref 0.0–0.5)
EOS%: 3.6 % (ref 0.0–7.0)
HCT: 45.6 % (ref 38.7–49.9)
HEMOGLOBIN: 16 g/dL (ref 13.0–17.1)
LYMPH#: 2.8 10*3/uL (ref 0.9–3.3)
LYMPH%: 34.9 % (ref 14.0–48.0)
MCH: 30.1 pg (ref 28.0–33.4)
MCHC: 35.1 g/dL (ref 32.0–35.9)
MCV: 86 fL (ref 82–98)
MONO#: 0.8 10*3/uL (ref 0.1–0.9)
MONO%: 10.4 % (ref 0.0–13.0)
NEUT#: 4.1 10*3/uL (ref 1.5–6.5)
NEUT%: 50.6 % (ref 40.0–80.0)
Platelets: 167 10*3/uL (ref 145–400)
RBC: 5.31 10*6/uL (ref 4.20–5.70)
RDW: 12.9 % (ref 11.1–15.7)
WBC: 8.1 10*3/uL (ref 4.0–10.0)

## 2017-03-09 LAB — CMP (CANCER CENTER ONLY)
ALBUMIN: 3.7 g/dL (ref 3.3–5.5)
ALK PHOS: 104 U/L — AB (ref 26–84)
ALT: 34 U/L (ref 10–47)
AST: 33 U/L (ref 11–38)
BILIRUBIN TOTAL: 0.9 mg/dL (ref 0.20–1.60)
BUN, Bld: 14 mg/dL (ref 7–22)
CO2: 27 mEq/L (ref 18–33)
Calcium: 9.3 mg/dL (ref 8.0–10.3)
Chloride: 108 mEq/L (ref 98–108)
Creat: 1.2 mg/dl (ref 0.6–1.2)
GLUCOSE: 106 mg/dL (ref 73–118)
Potassium: 3.7 mEq/L (ref 3.3–4.7)
Sodium: 146 mEq/L — ABNORMAL HIGH (ref 128–145)
TOTAL PROTEIN: 6.6 g/dL (ref 6.4–8.1)

## 2017-03-09 NOTE — Progress Notes (Signed)
Hematology and Oncology Follow Up Visit  Oscar Fuller [id number] Jun 26, 1955 61 y.o. 03/09/2017   Principle Diagnosis:   Thrombus of the right greater saphenous vein. Has history of pulmonary embolism  Factor V Leiden - Heterozygous  Current Therapy:   Xarelto 10 mg by mouth daily - complete this today. Aspirin 162 mg by mouth daily     Interim History:  Oscar Fuller is back for follow-up. We last saw him back in April. He now is on aspirin. He completed his Xarelto for 1 year. He is done very well.  He's had no problems with his right leg. There is no chest pain. No shortness of breath. He has had no nausea or vomiting. She's had no bleeding. There's been no change in bowel or bladder habits.  He is still working. He does travel quite a bit. He does wear compression stockings when he travels.  Overall, his performance status is ECOG 0   Medications:  Current Outpatient Prescriptions:  .  aspirin EC 81 MG tablet, Take 162 mg by mouth daily., Disp: , Rfl:  .  atorvastatin (LIPITOR) 40 MG tablet, Take 1 tablet (40 mg total) by mouth daily., Disp: 90 tablet, Rfl: 0 .  buPROPion (WELLBUTRIN) 100 MG tablet, Take 1 tablet (100 mg total) by mouth 2 (two) times daily., Disp: 60 tablet, Rfl: 3 .  sertraline (ZOLOFT) 100 MG tablet, Take 1 tablet (100 mg total) by mouth daily., Disp: 90 tablet, Rfl: 1 .  zolpidem (AMBIEN) 10 MG tablet, Take 0.5-1 tablets (5-10 mg total) by mouth at bedtime as needed for sleep., Disp: 30 tablet, Rfl: 0  Allergies: No Known Allergies  Past Medical History, Surgical history, Social history, and Family History were reviewed and updated.  Review of Systems: As as stated in the interim history  Physical Exam:  vitals were not taken for this visit.  Wt Readings from Last 3 Encounters:  12/08/16 244 lb 6 oz (110.8 kg)  09/08/16 246 lb 12.8 oz (111.9 kg)  06/10/16 251 lb 2 oz (113.9 kg)     Well-developed and well-nourished white male. His vital signs show  a temperature of 98.4. Pulse 72. Blood pressure 120/92. Weight is not listed. Head and neck exam shows no ocular or oral lesions. There are no palpable cervical or supraclavicular lymph nodes. Lungs are clear. Cardiac exam regular rate and rhythm with no murmurs, rubs or bruits. Abdomen is soft. He has good bowel sounds. He is mildly obese. There is no fluid wave. There is no palpable liver or spleen tip. Back exam shows no tenderness over the spine, ribs or hips. Extremities shows some slight nonpitting edema of the right leg. He has no obvious venous cord. He has good range of motion of his joints. He has negative Homans sign bilaterally. Neurological exam shows no focal neurological deficits. Skin exam shows no rashes, ecchymoses or petechia.  Lab Results  Component Value Date   WBC 8.1 03/09/2017   HGB 16.0 03/09/2017   HCT 45.6 03/09/2017   MCV 86 03/09/2017   PLT 167 03/09/2017     Chemistry      Component Value Date/Time   NA 144 03/10/2016 1312   K 4.4 03/10/2016 1312   CL 107 01/25/2016 1019   CO2 28 03/10/2016 1312   BUN 11.3 03/10/2016 1312   CREATININE 1.3 03/10/2016 1312      Component Value Date/Time   CALCIUM 9.5 03/10/2016 1312   ALKPHOS 88 01/25/2016 1019   AST 23  01/25/2016 1019   ALT 26 01/25/2016 1019   BILITOT 0.6 01/25/2016 1019         Impression and Plan: Oscar Fuller is A 61 year old male.Oscar KitchenHe actually has a factor V Leiden mutation. He is heterozygous for this.  She's everything is doing okay, I think we can let him go from the clinic. I'm not sure we really are adding much to his medical care.  He is wearing compression stockings when he travels. He does drink quite a bit of water.  I told him that he can always come back to see [place] if he has any problems.  Volanda Napoleon, MD 10/29/20188:31 AM

## 2017-03-10 LAB — D-DIMER, QUANTITATIVE (NOT AT ARMC): D-DIMER: 0.87 mg{FEU}/L — AB (ref 0.00–0.49)

## 2017-03-25 ENCOUNTER — Telehealth: Payer: Self-pay | Admitting: Internal Medicine

## 2017-03-25 ENCOUNTER — Ambulatory Visit: Payer: BLUE CROSS/BLUE SHIELD | Admitting: Internal Medicine

## 2017-03-25 NOTE — Telephone Encounter (Signed)
Patient cancelled 4pm appointment stating he's not feeling well, charge or no charge

## 2017-03-25 NOTE — Telephone Encounter (Signed)
No charge. 

## 2017-03-31 ENCOUNTER — Other Ambulatory Visit: Payer: Self-pay

## 2017-03-31 MED ORDER — BUPROPION HCL 100 MG PO TABS: 100.0000 mg | ORAL_TABLET | Freq: Two times a day (BID) | ORAL | 0 refills | Status: DC

## 2017-04-20 ENCOUNTER — Encounter: Payer: Self-pay | Admitting: Internal Medicine

## 2017-04-20 ENCOUNTER — Other Ambulatory Visit: Payer: Self-pay | Admitting: Internal Medicine

## 2017-04-24 ENCOUNTER — Other Ambulatory Visit: Payer: Self-pay | Admitting: Hematology & Oncology

## 2017-04-24 DIAGNOSIS — I82409 Acute embolism and thrombosis of unspecified deep veins of unspecified lower extremity: Secondary | ICD-10-CM

## 2017-07-06 ENCOUNTER — Other Ambulatory Visit: Payer: Self-pay | Admitting: Internal Medicine

## 2017-07-18 IMAGING — US US EXTREM LOW VENOUS*R*
1 series · 13 of 24 positions shown · non-contrast
Comparison: None.

CLINICAL DATA: Superficial phlebitis.  Right distal thigh pain



[Series 1: us extrem low venous*right* · 0.09mm/px · 13 of 41 slices shown]
[im 1/41]
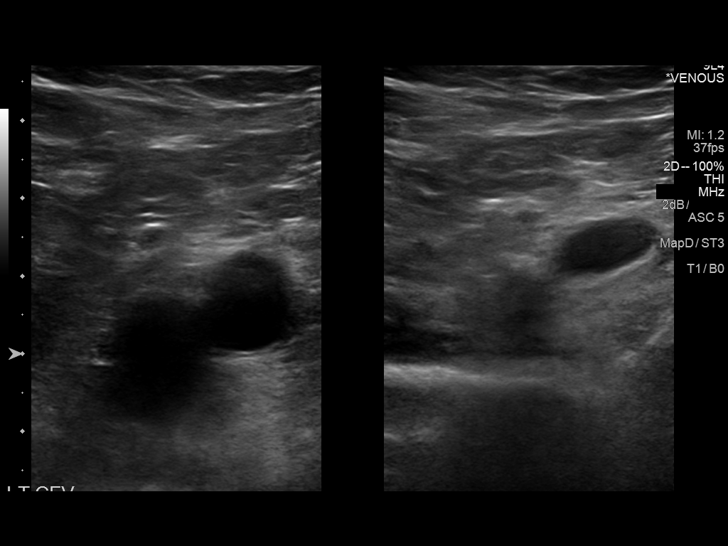
[im 4/41]
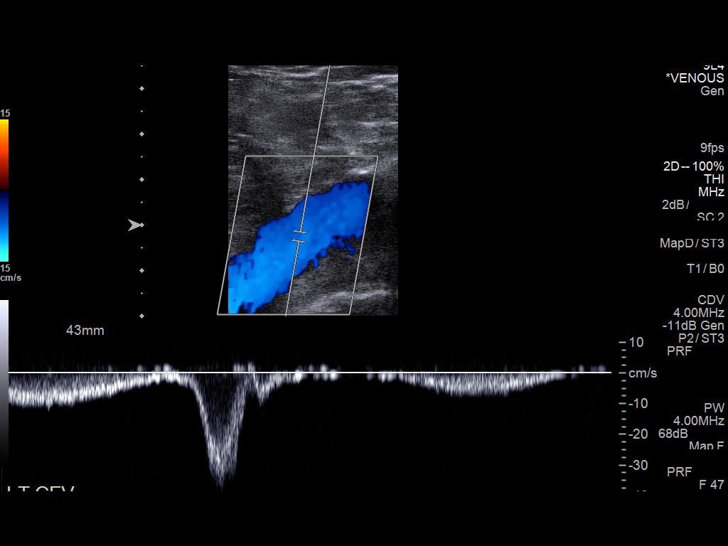
[im 7/41]
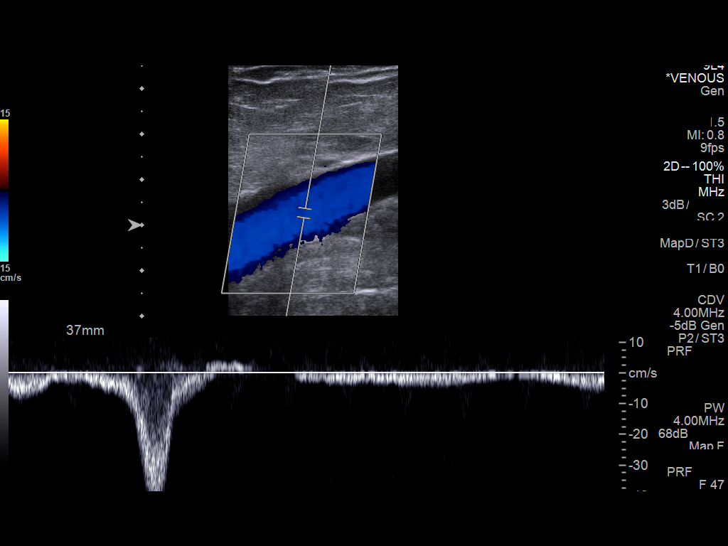
[im 11/41]
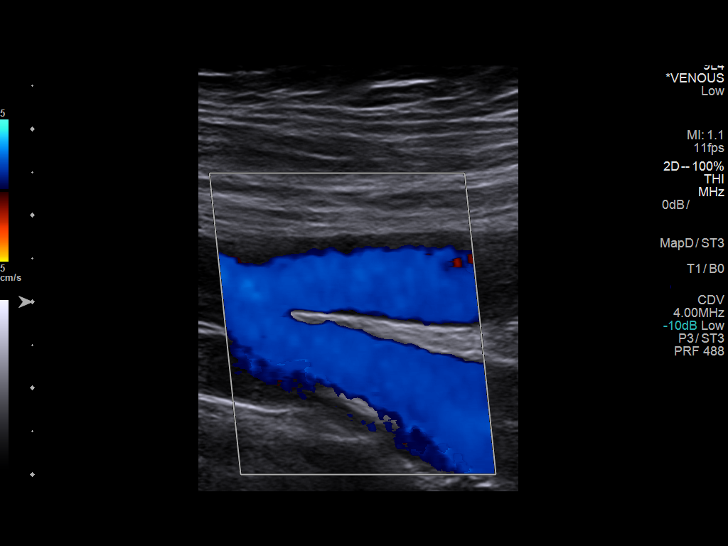
[im 14/41]
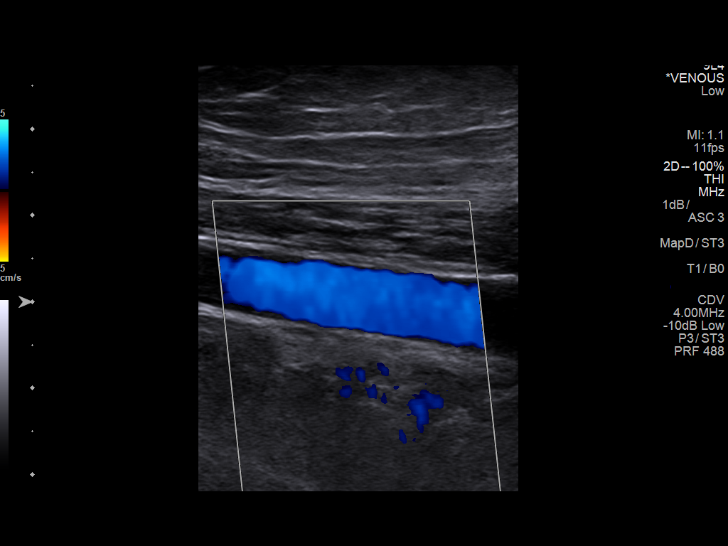
[im 18/41]
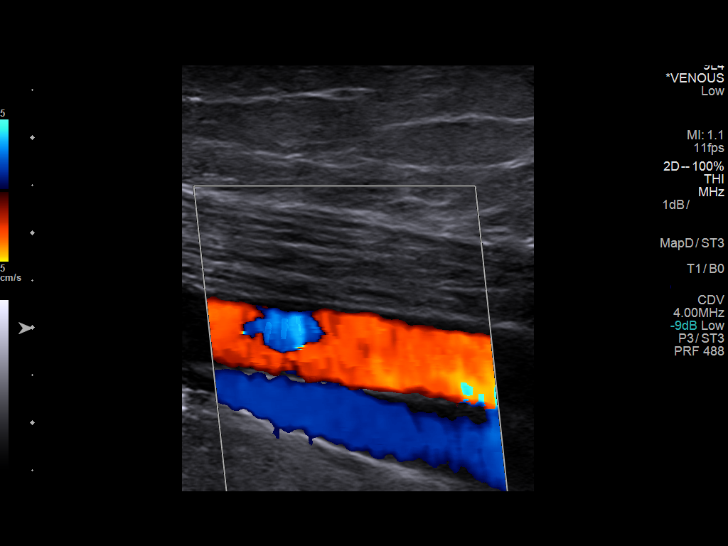
[im 21/41]
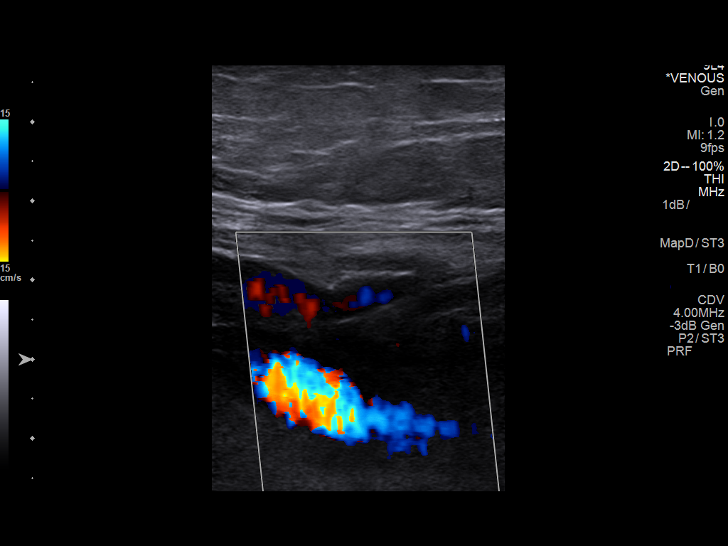
[im 23/41]
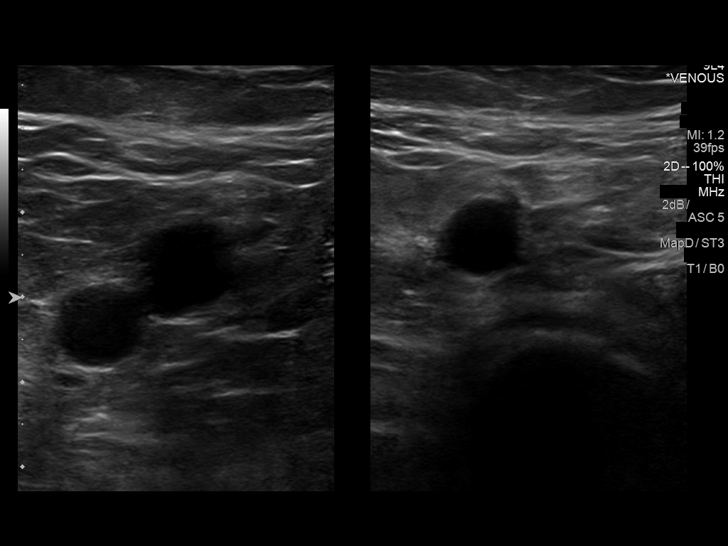
[im 27/41]
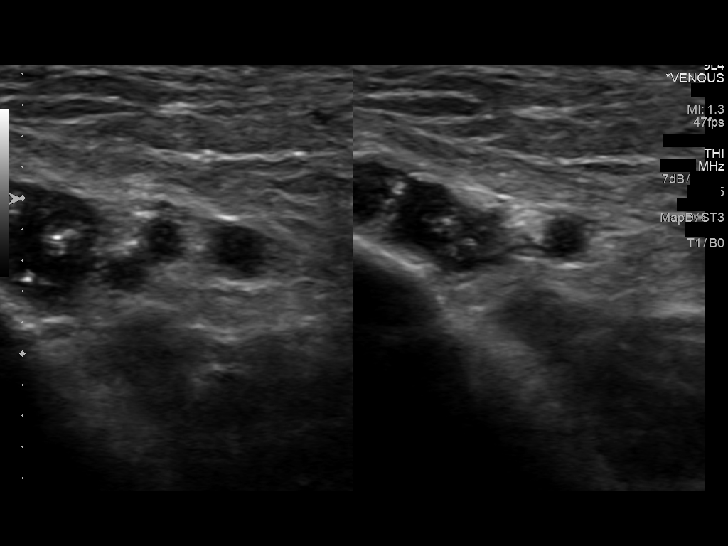
[im 30/41]
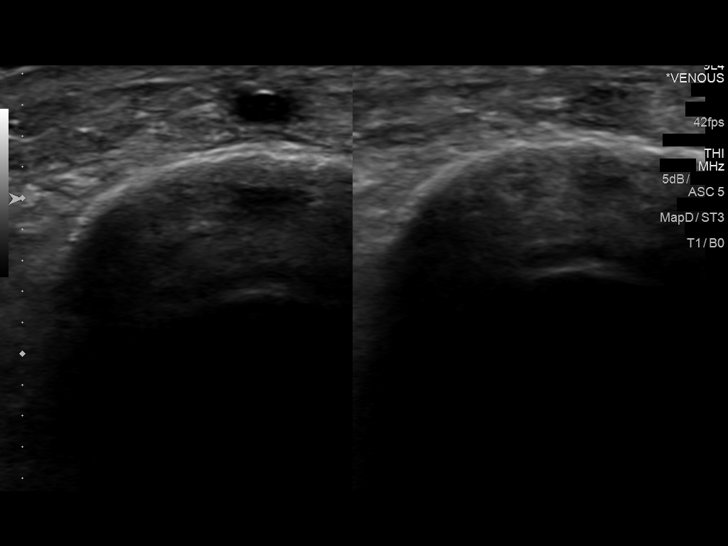
[im 34/41]
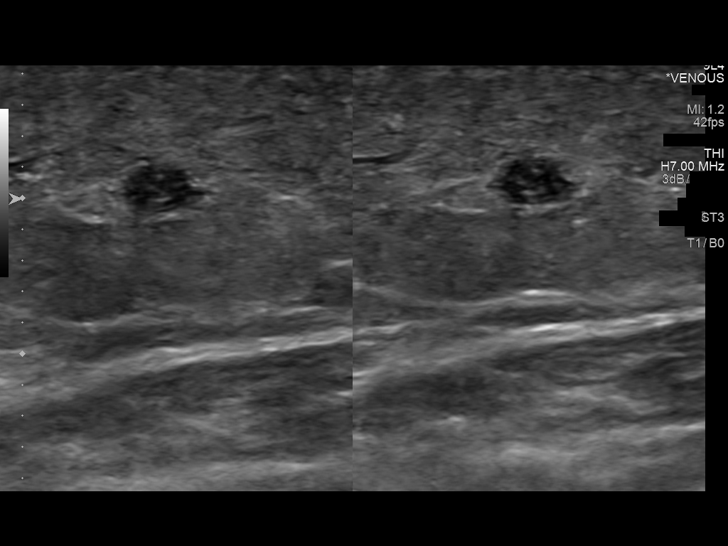
[im 37/41]
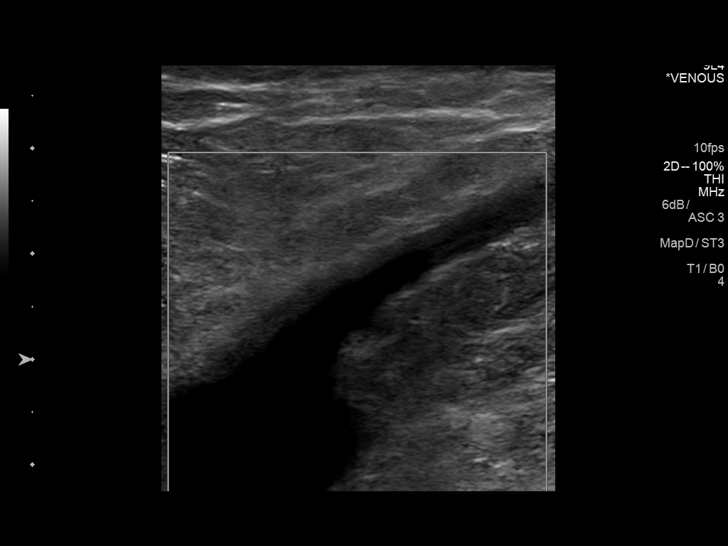
[im 41/41]
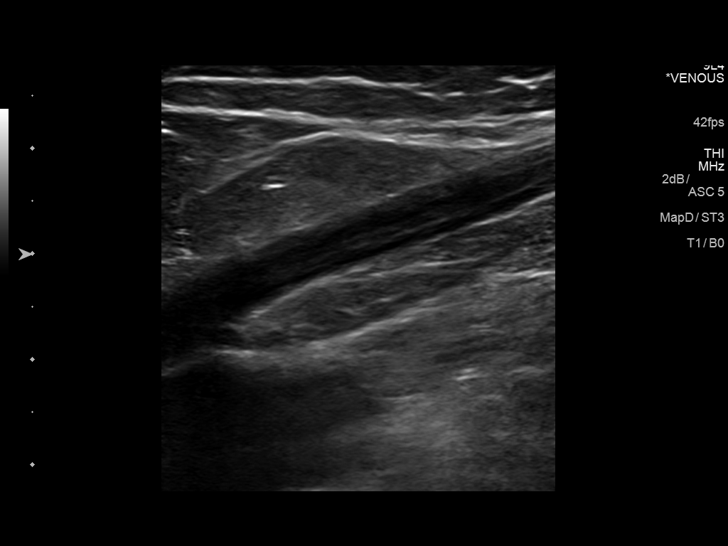

[13 of 24 positions shown; findings below may reference images not displayed]

FINDINGS: Contralateral Common Femoral Vein: Respiratory phasicity is normal
and symmetric with the symptomatic side. No evidence of thrombus.
Normal compressibility.

Common Femoral Vein: No evidence of thrombus. Normal
compressibility, respiratory phasicity and response to augmentation.

Saphenofemoral Junction: No evidence of thrombus. Normal
compressibility and flow on color Doppler imaging.

Profunda Femoral Vein: No evidence of thrombus. Normal
compressibility and flow on color Doppler imaging.

Femoral Vein: No evidence of thrombus. Normal compressibility,
respiratory phasicity and response to augmentation.

Popliteal Vein: No evidence of thrombus. Normal compressibility,
respiratory phasicity and response to augmentation.

Calf Veins: No evidence of thrombus. Normal compressibility and flow
on color Doppler imaging.

Superficial Great Saphenous Vein: Noncompressible. No internal color
flow.

Venous Reflux:  None.

Other Findings:  None.
IMPRESSION: 1. No evidence of deep venous thrombosis.
2. Occlusive thrombosis of the superficial greater saphenous vein
most consistent with superficial thrombophlebitis.

## 2017-07-29 ENCOUNTER — Other Ambulatory Visit: Payer: Self-pay | Admitting: Internal Medicine

## 2017-08-08 ENCOUNTER — Other Ambulatory Visit: Payer: Self-pay | Admitting: Internal Medicine

## 2017-08-17 DIAGNOSIS — J0120 Acute ethmoidal sinusitis, unspecified: Secondary | ICD-10-CM | POA: Diagnosis not present

## 2017-09-19 ENCOUNTER — Other Ambulatory Visit: Payer: Self-pay | Admitting: Internal Medicine

## 2017-09-24 ENCOUNTER — Telehealth: Payer: Self-pay | Admitting: *Deleted

## 2017-09-24 ENCOUNTER — Ambulatory Visit: Payer: Self-pay

## 2017-09-24 NOTE — Telephone Encounter (Signed)
Pt. Reports history of DVT that developed a PE. States he is having the same symptoms. C/o pain behind left knee and pain between both shoulders. States he was taken off Xarelto last October by Dr.Ennever. No availability with Dr. Larose Kells.Rod Holler ,[facility], states if there is an opening,ok to schedule pt. Offered appointment with another provider. Pt. States he will call Dr. Antonieta Pert office - states he does not want to go to ED.  Reason for Disposition . [1] Thigh or calf pain AND [2] only 1 side AND [3] present > 1 hour  Answer Assessment - Initial Assessment Questions 1. LOCATION and RADIATION: "Where is the pain located?"      Left knee - behind 2. QUALITY: "What does the pain feel like?"  (e.g., sharp, dull, aching, burning)     Dull ache 3. SEVERITY: "How bad is the pain?" "What does it keep you from doing?"   (Scale 1-10; or mild, moderate, severe)   -  MILD (1-3): doesn't interfere with normal activities    -  MODERATE (4-7): interferes with normal activities (e.g., work or school) or awakens from sleep, limping    -  SEVERE (8-10): excruciating pain, unable to do any normal activities, unable to walk     4-5 4. ONSET: "When did the pain start?" "Does it come and go, or is it there all the time?"     3 Days ago 5. RECURRENT: "Have you had this pain before?" If so, ask: "When, and what happened then?"     Yes - had abloos clot 6. SETTING: "Has there been any recent work, exercise or other activity that involved that part of the body?"      No 7. AGGRAVATING FACTORS: "What makes the knee pain worse?" (e.g., walking, climbing stairs, running)     n/a 8. ASSOCIATED SYMPTOMS: "Is there any swelling or redness of the knee?"     No 9. OTHER SYMPTOMS: "Do you have any other symptoms?" (e.g., chest pain, difficulty breathing, fever, calf pain)     Shoulder pain and rib cage in the middle 10. PREGNANCY: "Is there any chance you are pregnant?" "When was your last menstrual period?"       No  Protocols  used: KNEE PAIN-A-AH

## 2017-09-24 NOTE — Telephone Encounter (Signed)
Patient c/o pain between his shoulder blades on his back. He states "the pain isn't even as much pain, as it is an ache". He denies any respiratory symptoms.   Patient has a history of PE. He states this discomfort is NOT similar to when he had a PE. He is compliant with his ASA 162mg  daily. Denies any other symptoms.   Reviewed symptoms with Laverna Peace NP. At this time, further workup is not necessary. She would like patient to call the office back if symptoms worsen or if any respiratory symptoms present. Patient understands.

## 2017-09-25 ENCOUNTER — Ambulatory Visit: Payer: BLUE CROSS/BLUE SHIELD | Admitting: Medical

## 2017-09-28 ENCOUNTER — Ambulatory Visit (INDEPENDENT_AMBULATORY_CARE_PROVIDER_SITE_OTHER): Payer: BLUE CROSS/BLUE SHIELD | Admitting: Internal Medicine

## 2017-09-28 ENCOUNTER — Encounter: Payer: Self-pay | Admitting: Internal Medicine

## 2017-09-28 VITALS — BP 122/68 | HR 63 | Temp 97.8°F | Resp 14 | Ht 69.0 in | Wt 244.2 lb

## 2017-09-28 DIAGNOSIS — Z0000 Encounter for general adult medical examination without abnormal findings: Secondary | ICD-10-CM | POA: Diagnosis not present

## 2017-09-28 DIAGNOSIS — E785 Hyperlipidemia, unspecified: Secondary | ICD-10-CM | POA: Diagnosis not present

## 2017-09-28 DIAGNOSIS — Z1159 Encounter for screening for other viral diseases: Secondary | ICD-10-CM

## 2017-09-28 DIAGNOSIS — Z114 Encounter for screening for human immunodeficiency virus [HIV]: Secondary | ICD-10-CM

## 2017-09-28 DIAGNOSIS — R7303 Prediabetes: Secondary | ICD-10-CM

## 2017-09-28 LAB — COMPREHENSIVE METABOLIC PANEL
ALBUMIN: 4.3 g/dL (ref 3.5–5.2)
ALK PHOS: 83 U/L (ref 39–117)
ALT: 27 U/L (ref 0–53)
AST: 24 U/L (ref 0–37)
BUN: 12 mg/dL (ref 6–23)
CHLORIDE: 107 meq/L (ref 96–112)
CO2: 27 mEq/L (ref 19–32)
Calcium: 9.4 mg/dL (ref 8.4–10.5)
Creatinine, Ser: 1.22 mg/dL (ref 0.40–1.50)
GFR: 64.41 mL/min (ref >60.00)
Glucose, Bld: 98 mg/dL (ref 70–99)
POTASSIUM: 4.1 meq/L (ref 3.5–5.1)
SODIUM: 143 meq/L (ref 135–145)
Total Bilirubin: 0.9 mg/dL (ref 0.2–1.2)
Total Protein: 7.1 g/dL (ref 6.0–8.3)

## 2017-09-28 LAB — LIPID PANEL
CHOLESTEROL: 209 mg/dL — AB (ref 0–200)
HDL: 42 mg/dL (ref >39.00)
LDL CALC: 140 mg/dL — AB (ref 0–99)
NonHDL: 166.63
Total CHOL/HDL Ratio: 5
Triglycerides: 135 mg/dL (ref 0.0–149.0)
VLDL: 27 mg/dL (ref 0.0–40.0)

## 2017-09-28 LAB — HEMOGLOBIN A1C: Hgb A1c MFr Bld: 5.7 % (ref 4.6–6.5)

## 2017-09-28 LAB — PSA: PSA: 2.08 ng/mL (ref 0.10–4.00)

## 2017-09-28 MED ORDER — BUPROPION HCL 100 MG PO TABS: 100.0000 mg | ORAL_TABLET | Freq: Two times a day (BID) | ORAL | 2 refills | Status: DC

## 2017-09-28 MED ORDER — SERTRALINE HCL 100 MG PO TABS: 100.0000 mg | ORAL_TABLET | Freq: Every day | ORAL | 2 refills | Status: DC

## 2017-09-28 MED ORDER — ATORVASTATIN CALCIUM 40 MG PO TABS: 40.0000 mg | ORAL_TABLET | Freq: Every day | ORAL | 2 refills | Status: DC

## 2017-09-28 NOTE — Assessment & Plan Note (Signed)
-  Td 2013  -CCS: Colonoscopy  08/2008, tubular adenoma, colonoscopy 2015, next 2020 per GI letter -Prostate cancer screening: DRE wnl today, check a PSA -(+)  FH of CAD ---->stress test neg 12-2009 -Diet-exercise: Discussed -Labs: CMP, FLP, A1c, PSA, HIV, hep C

## 2017-09-28 NOTE — Progress Notes (Signed)
Pre visit review using our clinic review tool, if applicable. No additional management support is needed unless otherwise documented below in the visit note. 

## 2017-09-28 NOTE — Patient Instructions (Signed)
GO TO THE LAB : Get the blood work     GO TO THE FRONT DESK Schedule your next appointment for a   physical exam in 1 year.  Sooner depending on results.

## 2017-09-28 NOTE — Progress Notes (Signed)
Subjective:    Patient ID: Oscar Fuller, male    DOB: [birth year], 62 y.o.   MRN: [id number]  DOS:  09/28/2017 Type of visit - description : cpx Interval history: No major concerns.   Review of Systems About 10 days ago was doing some yard work, after that noted tingling at the third and fourth tip of the fingers, right hand.  Symptoms are mild.  No associated elbow or neck pain.  Other than above, a 14 point review of systems is negative     Past Medical History:  Diagnosis Date  . Anxiety   . Atypical chest pain    with ER visit in 8/11. ETT-myoview showed EF 61% no ischemia or infaraction 8/11  . Depression   . DVT (deep venous thrombosis) ([place])   . ED (erectile dysfunction) 07/2008   used some cialis, symptoms not active at present   . Hyperlipidemia   . Hypertension    was on ACEi-diuretics  . Increased prostate specific antigen (PSA) velocity    saw Dr Joelyn Oms, s/p Bx(-) 2002  . Prediabetes 09/07/2013  . Pulmonary embolism ([place]) 11-2013   PE and L DVT after a trip  . SCC (squamous cell carcinoma)    Dr Sherrye Payor, in the back  . Sleep apnea    HST 05/2012:  AHI 34/hr with desat as low as 69%.     Past Surgical History:  Procedure Laterality Date  . KNEE SURGERY  2009   left arthroscopy  . VASECTOMY      Social History   Socioeconomic History  . Marital status: Married    Spouse name: [name]  . Number of children: 2  . Years of education: BS  . Highest education level: Not on file  Occupational History  . Occupation: Psychologist, prison and probation services  Social Needs  . Financial resource strain: Not on file  . Food insecurity:    Worry: Not on file    Inability: Not on file  . Transportation needs:    Medical: Not on file    Non-medical: Not on file  Tobacco Use  . Smoking status: Never Smoker  . Smokeless tobacco: Never Used  Substance and Sexual Activity  . Alcohol use: No    Alcohol/week: 0.0 oz    Comment:    . Drug use: No  . Sexual activity: Not on file  Lifestyle  .  Physical activity:    Days per week: Not on file    Minutes per session: Not on file  . Stress: Not on file  Relationships  . Social connections:    Talks on phone: Not on file    Gets together: Not on file    Attends religious service: Not on file    Active member of club or organization: Not on file    Attends meetings of clubs or organizations: Not on file    Relationship status: Not on file  . Intimate partner violence:    Fear of current or ex partner: Not on file    Emotionally abused: Not on file    Physically abused: Not on file    Forced sexual activity: Not on file  Other Topics Concern  . Not on file  Social History Narrative   Married x 2  , 2nd wife [name] has a h/o SZ d/o         Family History  Problem Relation Age of Onset  . Coronary artery disease Father        stent  70, GF GM  . Hypertension Father        F B  . Lung cancer Father   . Glaucoma Mother   . Heart disease Mother   . Heart attack Brother        F B M GM  . Stroke Other        CVA GM  . Stomach cancer Paternal Grandfather   . Gout Brother   . Migraines Sister   . Colon cancer Neg Hx   . Prostate cancer Neg Hx      Allergies as of 09/28/2017   No Known Allergies     Medication List        Accurate as of 09/28/17 11:59 PM. Always use your most recent med list.          Aspirin 162.5 MG Cp24 Take 162 mg by mouth daily.   atorvastatin 40 MG tablet Commonly known as:  LIPITOR Take 1 tablet (40 mg total) by mouth daily.   buPROPion 100 MG tablet Commonly known as:  WELLBUTRIN Take 1 tablet (100 mg total) by mouth 2 (two) times daily.   sertraline 100 MG tablet Commonly known as:  ZOLOFT Take 1 tablet (100 mg total) by mouth daily.   zolpidem 10 MG tablet Commonly known as:  AMBIEN Take 0.5-1 tablets (5-10 mg total) by mouth at bedtime as needed for sleep.          Objective:   Physical Exam BP 122/68 (BP Location: Left Arm, Patient Position: Sitting, Cuff Size:  Normal)   Pulse 63   Temp 97.8 F (36.6 C) (Oral)   Resp 14   Ht 5\' 9"  (1.753 m)   Wt 244 lb 4 oz (110.8 kg)   SpO2 97%   BMI 36.07 kg/m  General:   Well developed, well nourished . NAD.  Neck: No  thyromegaly  HEENT:  Normocephalic . Face symmetric, atraumatic Lungs:  CTA B Normal respiratory effort, no intercostal retractions, no accessory muscle use. Heart: RRR,  no murmur.  No pretibial edema bilaterally  Abdomen:  Not distended, soft, non-tender. No rebound or rigidity.   Skin: Exposed areas without rash. Not pale. Not jaundice Rectal: External abnormalities: none. Normal sphincter tone. No rectal masses or tenderness.  No stools found Prostate: Prostate gland firm and smooth, no enlargement, nodularity, tenderness, mass, asymmetry or induration neurologic:  alert & oriented X3.  Speech normal, gait appropriate for age and unassisted Strength symmetric and appropriate for age.  Psych: Cognition and judgment appear intact.  Cooperative with normal attention span and concentration.  Behavior appropriate. No anxious or depressed appearing.     Assessment & Plan:   Assessment Prediabetes HTN Hyperlipidemia  depression, anxiety OSA 2014, Dr Halford Chessman Hematology Dr.Ennever, heterozygous for factor V Leiden --H/o  PE, DVT 11-2013 after a trip  --Thrombophlebitis 02-2015 -- off xarelto, on ASA 162 since 08-2016 CP:  stress test no ischemia 2011 GU: BPH, LUTS, increased PSA velocity BX 2002  Plan  Prediabetes: Check a A1c HTN: BP is very good, with no medications Hyperlipidemia: Out of Lipitor for a month, will check a FLP and refill Lipitor.  Consider check labs in 6 months. Depression anxiety: Controlled on Wellbutrin and Zoloft Heterozygous for factor V Leiden.  On aspirin, no symptoms. RTC 1 year

## 2017-09-29 LAB — HIV ANTIBODY (ROUTINE TESTING W REFLEX): HIV 1&2 Ab, 4th Generation: NONREACTIVE

## 2017-09-29 LAB — HEPATITIS C ANTIBODY
Hepatitis C Ab: NONREACTIVE
SIGNAL TO CUT-OFF: 0.03 (ref <1.00)

## 2017-09-29 NOTE — Assessment & Plan Note (Signed)
Prediabetes: Check a A1c HTN: BP is very good, with no medications Hyperlipidemia: Out of Lipitor for a month, will check a FLP and refill Lipitor.  Consider check labs in 6 months. Depression anxiety: Controlled on Wellbutrin and Zoloft Heterozygous for factor V Leiden.  On aspirin, no symptoms. RTC 1 year

## 2017-10-01 NOTE — Addendum Note (Signed)
Addended byDamita Dunnings D on: 10/01/2017 12:52 PM   Modules accepted: Orders

## 2017-10-15 ENCOUNTER — Other Ambulatory Visit: Payer: Self-pay | Admitting: Internal Medicine

## 2017-10-31 ENCOUNTER — Other Ambulatory Visit: Payer: Self-pay | Admitting: Internal Medicine

## 2017-11-07 ENCOUNTER — Other Ambulatory Visit: Payer: Self-pay | Admitting: Internal Medicine

## 2018-04-14 ENCOUNTER — Encounter: Payer: Self-pay | Admitting: Internal Medicine

## 2018-04-18 ENCOUNTER — Other Ambulatory Visit: Payer: Self-pay | Admitting: Hematology & Oncology

## 2018-06-14 ENCOUNTER — Other Ambulatory Visit: Payer: Self-pay | Admitting: Internal Medicine

## 2018-08-30 ENCOUNTER — Ambulatory Visit (INDEPENDENT_AMBULATORY_CARE_PROVIDER_SITE_OTHER): Payer: BLUE CROSS/BLUE SHIELD | Admitting: Internal Medicine

## 2018-08-30 ENCOUNTER — Other Ambulatory Visit: Payer: Self-pay

## 2018-08-30 DIAGNOSIS — L247 Irritant contact dermatitis due to plants, except food: Secondary | ICD-10-CM

## 2018-08-30 MED ORDER — PREDNISONE 10 MG PO TABS: ORAL_TABLET | ORAL | 0 refills | Status: DC

## 2018-08-30 MED ORDER — BETAMETHASONE DIPROPIONATE AUG 0.05 % EX CREA: TOPICAL_CREAM | Freq: Two times a day (BID) | CUTANEOUS | 0 refills | Status: DC

## 2018-08-30 NOTE — Progress Notes (Signed)
Subjective:    Patient ID: Oscar Fuller, male    DOB: [birth year], 63 y.o.   MRN: [id number]  DOS:  08/30/2018 Type of visit - description: Virtual Visit via Video Note  I connected with@ on 08/30/18 at  3:00 PM EDT by a video enabled telemedicine application and verified that I am speaking with the correct person using two identifiers.   THIS ENCOUNTER IS A VIRTUAL VISIT DUE TO COVID-19 - PATIENT WAS NOT SEEN IN THE OFFICE. PATIENT HAS CONSENTED TO VIRTUAL VISIT / TELEMEDICINE VISIT   Location of patient: home  Location of provider: office  I discussed the limitations of evaluation and management by telemedicine and the availability of in person appointments. The patient expressed understanding and agreed to proceed.  History of Present Illness: Acute 2 days ago he was doing yard work and few hours later developed a very itchy rash on the right arm. He also has some itching without swelling around his eyes. Patient suspects he  has poison ivy.     Review of Systems Denies fever chills No sneezing Some sinus congestion mostly when he is outdoors. No cough or sputum production  Past Medical History:  Diagnosis Date  . Anxiety   . Atypical chest pain    with ER visit in 8/11. ETT-myoview showed EF 61% no ischemia or infaraction 8/11  . Depression   . DVT (deep venous thrombosis) ([place])   . ED (erectile dysfunction) 07/2008   used some cialis, symptoms not active at present   . Hyperlipidemia   . Hypertension    was on ACEi-diuretics  . Increased prostate specific antigen (PSA) velocity    saw Dr Joelyn Oms, s/p Bx(-) 2002  . Prediabetes 09/07/2013  . Pulmonary embolism ([place]) 11-2013   PE and L DVT after a trip  . SCC (squamous cell carcinoma)    Dr Sherrye Payor, in the back  . Sleep apnea    HST 05/2012:  AHI 34/hr with desat as low as 69%.     Past Surgical History:  Procedure Laterality Date  . KNEE SURGERY  2009   left arthroscopy  . VASECTOMY      Social History    Socioeconomic History  . Marital status: Married    Spouse name: [name]  . Number of children: 2  . Years of education: BS  . Highest education level: Not on file  Occupational History  . Occupation: Psychologist, prison and probation services  Social Needs  . Financial resource strain: Not on file  . Food insecurity:    Worry: Not on file    Inability: Not on file  . Transportation needs:    Medical: Not on file    Non-medical: Not on file  Tobacco Use  . Smoking status: Never Smoker  . Smokeless tobacco: Never Used  Substance and Sexual Activity  . Alcohol use: No    Alcohol/week: 0.0 standard drinks    Comment:    . Drug use: No  . Sexual activity: Not on file  Lifestyle  . Physical activity:    Days per week: Not on file    Minutes per session: Not on file  . Stress: Not on file  Relationships  . Social connections:    Talks on phone: Not on file    Gets together: Not on file    Attends religious service: Not on file    Active member of club or organization: Not on file    Attends meetings of clubs or organizations: Not on file  Relationship status: Not on file  . Intimate partner violence:    Fear of current or ex partner: Not on file    Emotionally abused: Not on file    Physically abused: Not on file    Forced sexual activity: Not on file  Other Topics Concern  . Not on file  Social History Narrative   Married x 2  , 2nd wife [name] has a h/o SZ d/o          Allergies as of 08/30/2018   No Known Allergies     Medication List       Accurate as of August 30, 2018  3:00 PM. Always use your most recent med list.        Aspirin 162.5 MG Cp24 Take 162 mg by mouth daily.   atorvastatin 40 MG tablet Commonly known as:  LIPITOR Take 1 tablet (40 mg total) by mouth daily.   buPROPion 100 MG tablet Commonly known as:  WELLBUTRIN Take 1 tablet (100 mg total) by mouth 2 (two) times daily.   sertraline 100 MG tablet Commonly known as:  ZOLOFT Take 1 tablet (100 mg total) by mouth  daily.   Xarelto 10 MG Tabs tablet Generic drug:  rivaroxaban TAKE 1 TABLET BY MOUTH EVERY DAY WITH SUPPER   zolpidem 10 MG tablet Commonly known as:  AMBIEN Take 0.5-1 tablets (5-10 mg total) by mouth at bedtime as needed for sleep.           Objective:   Physical Exam There were no vitals taken for this visit. This is a video visit, alert oriented x3, no apparent distress.  He show me part of his right forearm, he indeed has some redness there.    Assessment     Assessment Prediabetes HTN Hyperlipidemia  depression, anxiety OSA 2014, Dr Halford Chessman Hematology Dr.Ennever, heterozygous for factor V Leiden --H/o  PE, DVT 11-2013 after a trip  --Thrombophlebitis 02-2015 -- off xarelto, on ASA 162 since 08-2016 CP:  stress test no ischemia 2011 GU: BPH, LUTS, increased PSA velocity BX 2002  PLAN: Contact dermatitis: Suspect contact dermatitis, historically, he requires prednisone to get better. Plan: Round of prednisone, sent a  strong topical steroid, Benadryl OTC. Also advised to wash all his clothes, shoes etc. that he used for yard work. History of PE, DVT: Patient is supposed to be on aspirin but he is also taking Xarelto.  Will let hematology know. Addendum: I communicated with hematology, he indeed does not need to be on Xarelto.  Call at the patient, he will stop it, will continue with aspirin, he will be vigilant of sxs  such as leg swelling, calf pain, chest pain or difficulty breathing. RTC scheduled for 10-01-2018.  Hopefully will be able to do the appointment face-to-face if the coronavirus situation improves.    I discussed the assessment and treatment plan with the patient. The patient was provided an opportunity to ask questions and all were answered. The patient agreed with the plan and demonstrated an understanding of the instructions.   The patient was advised to call back or seek an in-person evaluation if the symptoms worsen or if the condition fails to improve  as anticipated.

## 2018-08-31 NOTE — Assessment & Plan Note (Signed)
Contact dermatitis: Suspect contact dermatitis, historically, he requires prednisone to get better. Plan: Round of prednisone, sent a  strong topical steroid, Benadryl OTC. Also advised to wash all his clothes, shoes etc. that he used for yard work. History of PE, DVT: Patient is supposed to be on aspirin but he is also taking Xarelto.  Will let hematology know. Addendum: I communicated with hematology, he indeed does not need to be on Xarelto.  Call at the patient, he will stop it, will continue with aspirin, he will be vigilant of sxs  such as leg swelling, calf pain, chest pain or difficulty breathing. RTC scheduled for 10-01-2018.  Hopefully will be able to do the appointment face-to-face if the coronavirus situation improves.

## 2018-10-01 ENCOUNTER — Encounter: Payer: BLUE CROSS/BLUE SHIELD | Admitting: Internal Medicine

## 2018-12-11 ENCOUNTER — Other Ambulatory Visit: Payer: Self-pay | Admitting: Internal Medicine

## 2019-02-28 ENCOUNTER — Other Ambulatory Visit: Payer: Self-pay | Admitting: Internal Medicine

## 2019-04-11 ENCOUNTER — Encounter: Payer: Self-pay | Admitting: Gastroenterology

## 2019-09-06 ENCOUNTER — Other Ambulatory Visit: Payer: Self-pay

## 2019-09-06 ENCOUNTER — Encounter: Payer: Self-pay | Admitting: Internal Medicine

## 2019-09-06 ENCOUNTER — Ambulatory Visit (INDEPENDENT_AMBULATORY_CARE_PROVIDER_SITE_OTHER): Payer: BC Managed Care – PPO | Admitting: Internal Medicine

## 2019-09-06 VITALS — BP 141/87 | HR 74 | Temp 97.3°F | Resp 18 | Ht 69.0 in | Wt 238.2 lb

## 2019-09-06 DIAGNOSIS — R972 Elevated prostate specific antigen [PSA]: Secondary | ICD-10-CM

## 2019-09-06 DIAGNOSIS — R350 Frequency of micturition: Secondary | ICD-10-CM

## 2019-09-06 DIAGNOSIS — Z1211 Encounter for screening for malignant neoplasm of colon: Secondary | ICD-10-CM

## 2019-09-06 DIAGNOSIS — Z0000 Encounter for general adult medical examination without abnormal findings: Secondary | ICD-10-CM

## 2019-09-06 DIAGNOSIS — R739 Hyperglycemia, unspecified: Secondary | ICD-10-CM | POA: Diagnosis not present

## 2019-09-06 DIAGNOSIS — N401 Enlarged prostate with lower urinary tract symptoms: Secondary | ICD-10-CM | POA: Diagnosis not present

## 2019-09-06 DIAGNOSIS — I10 Essential (primary) hypertension: Secondary | ICD-10-CM | POA: Diagnosis not present

## 2019-09-06 LAB — URINALYSIS, ROUTINE W REFLEX MICROSCOPIC
Bilirubin Urine: NEGATIVE
Ketones, ur: NEGATIVE
Leukocytes,Ua: NEGATIVE
Nitrite: NEGATIVE
Specific Gravity, Urine: 1.02 (ref 1.000–1.030)
Total Protein, Urine: NEGATIVE
Urine Glucose: NEGATIVE
Urobilinogen, UA: 0.2 (ref 0.0–1.0)
pH: 7 (ref 5.0–8.0)

## 2019-09-06 LAB — CBC WITH DIFFERENTIAL/PLATELET
Basophils Absolute: 0 10*3/uL (ref 0.0–0.1)
Basophils Relative: 0.6 % (ref 0.0–3.0)
Eosinophils Absolute: 0.2 10*3/uL (ref 0.0–0.7)
Eosinophils Relative: 2.9 % (ref 0.0–5.0)
HCT: 46.6 % (ref 39.0–52.0)
Hemoglobin: 16 g/dL (ref 13.0–17.0)
Lymphocytes Relative: 28.1 % (ref 12.0–46.0)
Lymphs Abs: 2.3 10*3/uL (ref 0.7–4.0)
MCHC: 34.2 g/dL (ref 30.0–36.0)
MCV: 88 fl (ref 78.0–100.0)
Monocytes Absolute: 0.7 10*3/uL (ref 0.1–1.0)
Monocytes Relative: 8.2 % (ref 3.0–12.0)
Neutro Abs: 4.8 10*3/uL (ref 1.4–7.7)
Neutrophils Relative %: 60.2 % (ref 43.0–77.0)
Platelets: 194 10*3/uL (ref 150.0–400.0)
RBC: 5.3 Mil/uL (ref 4.22–5.81)
RDW: 13.7 % (ref 11.5–15.5)
WBC: 8 10*3/uL (ref 4.0–10.5)

## 2019-09-06 LAB — COMPREHENSIVE METABOLIC PANEL
ALT: 24 U/L (ref 0–53)
AST: 24 U/L (ref 0–37)
Albumin: 4.4 g/dL (ref 3.5–5.2)
Alkaline Phosphatase: 93 U/L (ref 39–117)
BUN: 14 mg/dL (ref 6–23)
CO2: 30 mEq/L (ref 19–32)
Calcium: 9.4 mg/dL (ref 8.4–10.5)
Chloride: 103 mEq/L (ref 96–112)
Creatinine, Ser: 1.2 mg/dL (ref 0.40–1.50)
GFR: 61.37 mL/min (ref >60.00)
Glucose, Bld: 107 mg/dL — ABNORMAL HIGH (ref 70–99)
Potassium: 4.7 mEq/L (ref 3.5–5.1)
Sodium: 139 mEq/L (ref 135–145)
Total Bilirubin: 0.9 mg/dL (ref 0.2–1.2)
Total Protein: 6.9 g/dL (ref 6.0–8.3)

## 2019-09-06 LAB — HEMOGLOBIN A1C: Hgb A1c MFr Bld: 5.7 % (ref 4.6–6.5)

## 2019-09-06 LAB — LIPID PANEL
Cholesterol: 137 mg/dL (ref 0–200)
HDL: 34.7 mg/dL — ABNORMAL LOW (ref >39.00)
LDL Cholesterol: 83 mg/dL (ref 0–99)
NonHDL: 102.39
Total CHOL/HDL Ratio: 4
Triglycerides: 97 mg/dL (ref 0.0–149.0)
VLDL: 19.4 mg/dL (ref 0.0–40.0)

## 2019-09-06 LAB — TSH: TSH: 1.33 u[IU]/mL (ref 0.35–4.50)

## 2019-09-06 LAB — PSA: PSA: 5.49 ng/mL — ABNORMAL HIGH (ref 0.10–4.00)

## 2019-09-06 NOTE — Progress Notes (Signed)
Pre visit review using our clinic review tool, if applicable. No additional management support is needed unless otherwise documented below in the visit note. 

## 2019-09-06 NOTE — Assessment & Plan Note (Signed)
Here for CPX Prediabetes: Check A1c HTN: Lifestyle control, EKG today NSR, no acute changes Hyperlipidemia: On Lipitor, checking labs Depression anxiety: + Stress related to quarantine, working remotely, on Wellbutrin, Zoloft.  Symptoms controlled. OSA: Not using CPAP 100%, encouraged to increase usage. H/of PE, DVT: On aspirin 162 mg BPH, LUTS: Ongoing symptoms, mild, denies the need to take medication. RTC 1 year

## 2019-09-06 NOTE — Progress Notes (Signed)
Subjective:    Patient ID: Oscar Fuller, male    DOB: [birth year], 64 y.o.   MRN: [id number]  DOS:  09/06/2019 Type of visit - description: Here for CPX. In general feels well. + Stress related to the quarantine, working from home.   Review of Systems Still has  LUTS, nocturia x1 or 2 at night, urinary frequency sometimes.  No blood in the stools.  Other than above, a 14 point review of systems is negative    Past Medical History:  Diagnosis Date  . Anxiety   . Atypical chest pain    with ER visit in 8/11. ETT-myoview showed EF 61% no ischemia or infaraction 8/11  . Depression   . DVT (deep venous thrombosis) ([place])   . ED (erectile dysfunction) 07/2008   used some cialis, symptoms not active at present   . Hyperlipidemia   . Hypertension    was on ACEi-diuretics  . Increased prostate specific antigen (PSA) velocity    saw Dr Joelyn Oms, s/p Bx(-) 2002  . Prediabetes 09/07/2013  . Pulmonary embolism ([place]) 11-2013   PE and L DVT after a trip  . SCC (squamous cell carcinoma)    Dr Sherrye Payor, in the back  . Sleep apnea    HST 05/2012:  AHI 34/hr with desat as low as 69%.     Past Surgical History:  Procedure Laterality Date  . KNEE SURGERY  2009   left arthroscopy  . VASECTOMY     Family History  Problem Relation Age of Onset  . Coronary artery disease Father        stent 96, GF GM  . Hypertension Father        F B  . Lung cancer Father   . Glaucoma Mother   . Heart disease Mother   . Heart attack Brother        F B M GM  . Stroke Other        CVA GM  . Stomach cancer Paternal Grandfather   . Gout Brother   . Migraines Sister   . Colon cancer Neg Hx   . Prostate cancer Neg Hx     Allergies as of 09/06/2019   No Known Allergies     Medication List       Accurate as of September 06, 2019  4:38 PM. If you have any questions, ask your nurse or doctor.        STOP taking these medications   augmented betamethasone dipropionate 0.05 % cream Commonly known as:  DIPROLENE-AF Stopped by: [name], MD   predniSONE 10 MG tablet Commonly known as: DELTASONE Stopped by: [name], MD     TAKE these medications   Aspirin 162.5 MG Cp24 Take 162 mg by mouth daily.   atorvastatin 40 MG tablet Commonly known as: LIPITOR Take 1 tablet (40 mg total) by mouth daily.   buPROPion 100 MG tablet Commonly known as: WELLBUTRIN Take 1 tablet (100 mg total) by mouth 2 (two) times daily.   sertraline 100 MG tablet Commonly known as: ZOLOFT Take 1 tablet (100 mg total) by mouth daily.          Objective:   Physical Exam BP (!) 141/87 (BP Location: Left Arm, Patient Position: Sitting, Cuff Size: Normal)   Pulse 74   Temp (!) 97.3 F (36.3 C) (Temporal)   Resp 18   Ht 5\' 9"  (1.753 m)   Wt 238 lb 4 oz (108.1 kg)   SpO2  96%   BMI 35.18 kg/m  General: Well developed, NAD, BMI noted Neck: No  thyromegaly  HEENT:  Normocephalic . Face symmetric, atraumatic Lungs:  CTA B Normal respiratory effort, no intercostal retractions, no accessory muscle use. Heart: RRR,  no murmur.  Abdomen:  Not distended, soft, non-tender. No rebound or rigidity.   Lower extremities: no pretibial edema bilaterally  Skin: Exposed areas without rash. Not pale. Not jaundice DRE: Normal sphincter tone, brown stools, prostate symmetric, nontender. Neurologic:  alert & oriented X3.  Speech normal, gait appropriate for age and unassisted Strength symmetric and appropriate for age.  Psych: Cognition and judgment appear intact.  Cooperative with normal attention span and concentration.  Behavior appropriate. No anxious or depressed appearing.     Assessment    Assessment Prediabetes HTN Hyperlipidemia  depression, anxiety OSA 2014, Dr Halford Chessman Hematology Dr.Ennever, heterozygous for factor V Leiden --H/o  PE, DVT 11-2013 after a trip  --Thrombophlebitis 02-2015 -- off xarelto, on ASA 162 since 08-2016 CP:  stress test no ischemia 2011 GU: BPH, LUTS, increased PSA  velocity BX 2002  PLAN: Here for CPX Prediabetes: Check A1c HTN: Lifestyle control, EKG today NSR, no acute changes Hyperlipidemia: On Lipitor, checking labs Depression anxiety: + Stress related to quarantine, working remotely, on Wellbutrin, Zoloft.  Symptoms controlled. OSA: Not using CPAP 100%, encouraged to increase usage. H/of PE, DVT: On aspirin 162 mg BPH, LUTS: Ongoing symptoms, mild, denies the need to take medication. RTC 1 year     This visit occurred during the SARS-CoV-2 public health emergency.  Safety protocols were in place, including screening questions prior to the visit, additional usage of staff PPE, and extensive cleaning of exam room while observing appropriate contact time as indicated for disinfecting solutions.

## 2019-09-06 NOTE — Patient Instructions (Addendum)
Check your blood pressure monthly BP GOAL is between 110/65 and  135/85. If it is consistently higher or lower, let me know  Use your CPAP nightly   GO TO THE LAB : Get the blood work     [facility], [place] back for a physical exam in 1 year  Call your pharmacy for refills as needed

## 2019-09-06 NOTE — Assessment & Plan Note (Signed)
-  Td 2013  -Covid shots: To get second shot today -CCS: Colonoscopy  08/2008, tubular adenoma, colonoscopy 2015, was due for colonoscopy 2020, patient request a referral to GI. -Prostate cancer screening: DRE and PSA wnl 2019.  DRE  today benign, check a PSA.  Has mild LUTS, at baseline. check a UA urine culture. -(+)  FH of CAD ---->stress test neg 12-2009 -Diet-exercise: Discussed -Labs:  CMP, FLP, CBC, A1c, TSH, PSA, UA, urine culture

## 2019-09-07 LAB — URINE CULTURE
MICRO NUMBER:: 10410511
SPECIMEN QUALITY:: ADEQUATE

## 2019-09-09 NOTE — Addendum Note (Signed)
Addended byDamita Dunnings D on: 09/09/2019 01:09 PM   Modules accepted: Orders

## 2019-09-21 ENCOUNTER — Encounter: Payer: Self-pay | Admitting: Gastroenterology

## 2019-11-23 ENCOUNTER — Encounter: Payer: BC Managed Care – PPO | Admitting: Gastroenterology

## 2019-12-02 ENCOUNTER — Encounter: Payer: Self-pay | Admitting: Internal Medicine

## 2020-01-24 DIAGNOSIS — R972 Elevated prostate specific antigen [PSA]: Secondary | ICD-10-CM | POA: Diagnosis not present

## 2020-01-24 DIAGNOSIS — R350 Frequency of micturition: Secondary | ICD-10-CM | POA: Diagnosis not present

## 2020-01-24 DIAGNOSIS — R3912 Poor urinary stream: Secondary | ICD-10-CM | POA: Diagnosis not present

## 2020-01-24 LAB — PSA: PSA: 1.57

## 2020-02-10 HISTORY — PX: PROSTATE BIOPSY: SHX241

## 2020-02-21 DIAGNOSIS — R972 Elevated prostate specific antigen [PSA]: Secondary | ICD-10-CM | POA: Diagnosis not present

## 2020-02-28 DIAGNOSIS — R972 Elevated prostate specific antigen [PSA]: Secondary | ICD-10-CM | POA: Diagnosis not present

## 2020-02-28 DIAGNOSIS — R350 Frequency of micturition: Secondary | ICD-10-CM | POA: Diagnosis not present

## 2020-02-28 DIAGNOSIS — N401 Enlarged prostate with lower urinary tract symptoms: Secondary | ICD-10-CM | POA: Diagnosis not present

## 2020-02-28 DIAGNOSIS — R3912 Poor urinary stream: Secondary | ICD-10-CM | POA: Diagnosis not present

## 2020-03-09 ENCOUNTER — Encounter: Payer: Self-pay | Admitting: Internal Medicine

## 2020-03-15 ENCOUNTER — Encounter: Payer: Self-pay | Admitting: Gastroenterology

## 2020-03-15 ENCOUNTER — Ambulatory Visit: Payer: BC Managed Care – PPO | Admitting: Gastroenterology

## 2020-05-02 ENCOUNTER — Ambulatory Visit: Payer: BC Managed Care – PPO | Admitting: Gastroenterology

## 2020-05-15 ENCOUNTER — Other Ambulatory Visit: Payer: Self-pay | Admitting: Internal Medicine

## 2020-07-25 ENCOUNTER — Telehealth: Payer: Self-pay | Admitting: Internal Medicine

## 2020-07-25 MED ORDER — SERTRALINE HCL 100 MG PO TABS: 100.0000 mg | ORAL_TABLET | Freq: Every day | ORAL | 0 refills | Status: DC

## 2020-07-25 NOTE — Telephone Encounter (Signed)
Rx sent 

## 2020-07-25 NOTE — Telephone Encounter (Signed)
Medication: sertraline (ZOLOFT) 100 MG tablet [[id number]]    Has the patient contacted their pharmacy? no (If no, request that the patient contact the pharmacy for the refill.) (If yes, when and what did the pharmacy advise?)    Preferred Pharmacy (with phone number or street name):  [id number] - [place], [place] - [address] Phone:  [phone]  Fax:  [phone]        Agent: Please be advised that RX refills may take up to 3 business days. We ask that you follow-up with your pharmacy.

## 2020-08-07 ENCOUNTER — Ambulatory Visit (INDEPENDENT_AMBULATORY_CARE_PROVIDER_SITE_OTHER): Payer: BC Managed Care – PPO | Admitting: Family Medicine

## 2020-08-07 ENCOUNTER — Other Ambulatory Visit: Payer: Self-pay

## 2020-08-07 ENCOUNTER — Encounter: Payer: Self-pay | Admitting: Family Medicine

## 2020-08-07 VITALS — BP 120/76 | HR 98 | Temp 98.6°F | Ht 69.5 in | Wt 233.1 lb

## 2020-08-07 DIAGNOSIS — J069 Acute upper respiratory infection, unspecified: Secondary | ICD-10-CM | POA: Diagnosis not present

## 2020-08-07 MED ORDER — PREDNISONE 20 MG PO TABS: 40.0000 mg | ORAL_TABLET | Freq: Every day | ORAL | 0 refills | Status: AC

## 2020-08-07 MED ORDER — BENZONATATE 100 MG PO CAPS: 100.0000 mg | ORAL_CAPSULE | Freq: Three times a day (TID) | ORAL | 0 refills | Status: DC | PRN

## 2020-08-07 NOTE — Patient Instructions (Signed)
Continue to push fluids, practice good hand hygiene, and cover your mouth if you cough. ? ?If you start having fevers, shaking or shortness of breath, seek immediate care. ? ?OK to take Tylenol 1000 mg (2 extra strength tabs) or 975 mg (3 regular strength tabs) every 6 hours as needed. ? ?Let us know if you need anything. ?

## 2020-08-07 NOTE — Progress Notes (Signed)
Chief Complaint  Patient presents with  . Sinus Problem    Oscar Fuller here for URI complaints.  Duration: 5 days  Associated symptoms: sinus headache, sinus congestion, itchy watery eyes, sore throat and cough Denies: sinus pain, rhinorrhea, ear pain, ear drainage, wheezing, shortness of breath, myalgia and fevers Treatment to date: Mucinex, Delsym, OJ Sick contacts: Yes - grandkids  Past Medical History:  Diagnosis Date  . Anxiety   . Atypical chest pain    with ER visit in 8/11. ETT-myoview showed EF 61% no ischemia or infaraction 8/11  . Depression   . DVT (deep venous thrombosis) ([place])   . ED (erectile dysfunction) 07/2008   used some cialis, symptoms not active at present   . Hyperlipidemia   . Hypertension    was on ACEi-diuretics  . Increased prostate specific antigen (PSA) velocity    saw Dr Joelyn Oms, s/p Bx(-) 2002  . Prediabetes 09/07/2013  . Pulmonary embolism ([place]) 11-2013   PE and L DVT after a trip  . SCC (squamous cell carcinoma)    Dr Sherrye Payor, in the back  . Sleep apnea    HST 05/2012:  AHI 34/hr with desat as low as 69%.   . Tubular adenoma of colon 08/2008    BP 120/76 (BP Location: Left Arm, Patient Position: Sitting, Cuff Size: Normal)   Pulse 98   Temp 98.6 F (37 C) (Oral)   Ht 5' 9.5" (1.765 m)   Wt 233 lb 2 oz (105.7 kg)   SpO2 95%   BMI 33.93 kg/m  General: Awake, alert, appears stated age HEENT: AT, [place], ears patent b/l and TM's neg, nares patent w/o discharge, pharynx pink and without exudates, MMM Neck: No masses or asymmetry Heart: RRR Lungs: CTAB, no accessory muscle use Psych: Age appropriate judgment and insight, normal mood and affect  Viral URI with cough - Plan: predniSONE (DELTASONE) 20 MG tablet, benzonatate (TESSALON) 100 MG capsule  Pred burst 5 d, 40 mg/d to help with sinus pressure/headache. Benzonatate for cough.  Continue to push fluids, practice good hand hygiene, cover mouth when coughing. F/u prn. If starting to  experience fevers, shaking, or shortness of breath, seek immediate care. Pt voiced understanding and agreement to the plan.  [place], DO 08/07/20 9:35 AM

## 2020-09-07 ENCOUNTER — Other Ambulatory Visit: Payer: Self-pay

## 2020-09-07 ENCOUNTER — Ambulatory Visit (INDEPENDENT_AMBULATORY_CARE_PROVIDER_SITE_OTHER): Payer: BC Managed Care – PPO | Admitting: Internal Medicine

## 2020-09-07 ENCOUNTER — Encounter: Payer: Self-pay | Admitting: Internal Medicine

## 2020-09-07 VITALS — BP 136/78 | HR 76 | Temp 98.3°F | Resp 16 | Ht 70.0 in | Wt 235.5 lb

## 2020-09-07 DIAGNOSIS — Z0000 Encounter for general adult medical examination without abnormal findings: Secondary | ICD-10-CM

## 2020-09-07 DIAGNOSIS — Z23 Encounter for immunization: Secondary | ICD-10-CM | POA: Diagnosis not present

## 2020-09-07 DIAGNOSIS — Z1211 Encounter for screening for malignant neoplasm of colon: Secondary | ICD-10-CM

## 2020-09-07 DIAGNOSIS — R739 Hyperglycemia, unspecified: Secondary | ICD-10-CM | POA: Diagnosis not present

## 2020-09-07 DIAGNOSIS — E785 Hyperlipidemia, unspecified: Secondary | ICD-10-CM | POA: Diagnosis not present

## 2020-09-07 LAB — CBC WITH DIFFERENTIAL/PLATELET
Basophils Absolute: 0.1 10*3/uL (ref 0.0–0.1)
Basophils Relative: 0.7 % (ref 0.0–3.0)
Eosinophils Absolute: 0.3 10*3/uL (ref 0.0–0.7)
Eosinophils Relative: 4 % (ref 0.0–5.0)
HCT: 47.5 % (ref 39.0–52.0)
Hemoglobin: 16.2 g/dL (ref 13.0–17.0)
Lymphocytes Relative: 34 % (ref 12.0–46.0)
Lymphs Abs: 2.4 10*3/uL (ref 0.7–4.0)
MCHC: 34.1 g/dL (ref 30.0–36.0)
MCV: 87.6 fl (ref 78.0–100.0)
Monocytes Absolute: 0.7 10*3/uL (ref 0.1–1.0)
Monocytes Relative: 9.7 % (ref 3.0–12.0)
Neutro Abs: 3.7 10*3/uL (ref 1.4–7.7)
Neutrophils Relative %: 51.6 % (ref 43.0–77.0)
Platelets: 193 10*3/uL (ref 150.0–400.0)
RBC: 5.43 Mil/uL (ref 4.22–5.81)
RDW: 13.6 % (ref 11.5–15.5)
WBC: 7.1 10*3/uL (ref 4.0–10.5)

## 2020-09-07 LAB — COMPREHENSIVE METABOLIC PANEL
ALT: 23 U/L (ref 0–53)
AST: 21 U/L (ref 0–37)
Albumin: 4.3 g/dL (ref 3.5–5.2)
Alkaline Phosphatase: 94 U/L (ref 39–117)
BUN: 16 mg/dL (ref 6–23)
CO2: 30 mEq/L (ref 19–32)
Calcium: 9.6 mg/dL (ref 8.4–10.5)
Chloride: 106 mEq/L (ref 96–112)
Creatinine, Ser: 1.19 mg/dL (ref 0.40–1.50)
GFR: 65.29 mL/min (ref >60.00)
Glucose, Bld: 103 mg/dL — ABNORMAL HIGH (ref 70–99)
Potassium: 4.7 mEq/L (ref 3.5–5.1)
Sodium: 142 mEq/L (ref 135–145)
Total Bilirubin: 0.9 mg/dL (ref 0.2–1.2)
Total Protein: 6.9 g/dL (ref 6.0–8.3)

## 2020-09-07 LAB — HEMOGLOBIN A1C: Hgb A1c MFr Bld: 5.7 % (ref 4.6–6.5)

## 2020-09-07 LAB — LIPID PANEL
Cholesterol: 163 mg/dL (ref 0–200)
HDL: 39.7 mg/dL (ref >39.00)
LDL Cholesterol: 97 mg/dL (ref 0–99)
NonHDL: 123.31
Total CHOL/HDL Ratio: 4
Triglycerides: 133 mg/dL (ref 0.0–149.0)
VLDL: 26.6 mg/dL (ref 0.0–40.0)

## 2020-09-07 NOTE — Progress Notes (Signed)
Subjective:    Patient ID: Oscar Fuller, male    DOB: 1956/03/13, 65 y.o.   MRN: [id number]  DOS:  09/07/2020 Type of visit - description: CPX Here for a physical exam. In general feels very good. Did report varicose veins, R leg.  Not painful. Emotionally doing very well.   Review of Systems  Other than above, a 14 point review of systems is negative      Past Medical History:  Diagnosis Date  . Anxiety   . Atypical chest pain    with ER visit in 8/11. ETT-myoview showed EF 61% no ischemia or infaraction 8/11  . Depression   . DVT (deep venous thrombosis) ([place])   . ED (erectile dysfunction) 07/2008   used some cialis, symptoms not active at present   . Hyperlipidemia   . Hypertension    was on ACEi-diuretics  . Increased prostate specific antigen (PSA) velocity    saw Dr Joelyn Oms, s/p Bx(-) 2002  . Prediabetes 09/07/2013  . Pulmonary embolism ([place]) 11-2013   PE and L DVT after a trip  . SCC (squamous cell carcinoma)    Dr Sherrye Payor, in the back  . Sleep apnea    HST 05/2012:  AHI 34/hr with desat as low as 69%.   . Tubular adenoma of colon 08/2008    Past Surgical History:  Procedure Laterality Date  . KNEE SURGERY  2009   left arthroscopy  . PROSTATE BIOPSY  02/2020   benign  . VASECTOMY     Social History   Socioeconomic History  . Marital status: Married    Spouse name: [name]  . Number of children: 2  . Years of education: BS  . Highest education level: Not on file  Occupational History  . Occupation: Psychologist, prison and probation services, Press photographer, Production manager   Tobacco Use  . Smoking status: Never Smoker  . Smokeless tobacco: Never Used  Substance and Sexual Activity  . Alcohol use: No    Alcohol/week: 0.0 standard drinks    Comment:    . Drug use: No  . Sexual activity: Not on file  Other Topics Concern  . Not on file  Social History Narrative   Married x 2  , 2nd wife [name] has a h/o SZ d/o    2 daughters : Therapist, sports RT      Social Determinants of Health   Financial Resource  Strain: Not on file  Food Insecurity: Not on file  Transportation Needs: Not on file  Physical Activity: Not on file  Stress: Not on file  Social Connections: Not on file  Intimate Partner Violence: Not on file    Allergies as of 09/07/2020   No Known Allergies     Medication List       Accurate as of September 07, 2020 11:59 PM. If you have any questions, ask your nurse or doctor.        STOP taking these medications   benzonatate 100 MG capsule Commonly known as: TESSALON Stopped by: [name], MD     TAKE these medications   Aspirin 162.5 MG Cp24 Take 162 mg by mouth daily.   atorvastatin 40 MG tablet Commonly known as: LIPITOR Take 1 tablet (40 mg total) by mouth daily.   buPROPion 100 MG tablet Commonly known as: WELLBUTRIN Take 1 tablet (100 mg total) by mouth 2 (two) times daily.   sertraline 100 MG tablet Commonly known as: ZOLOFT Take 1 tablet (100 mg total) by mouth daily.   tamsulosin  0.4 MG Caps capsule Commonly known as: FLOMAX Take 0.4 mg by mouth daily.          Objective:   Physical Exam BP 136/78 (BP Location: Left Arm, Patient Position: Sitting, Cuff Size: Normal)   Pulse 76   Temp 98.3 F (36.8 C) (Oral)   Resp 16   Ht 5\' 10"  (1.778 m)   Wt 235 lb 8 oz (106.8 kg)   SpO2 96%   BMI 33.79 kg/m  General: Well developed, NAD, BMI noted Neck: No  thyromegaly  HEENT:  Normocephalic . Face symmetric, atraumatic Lungs:  CTA B Normal respiratory effort, no intercostal retractions, no accessory muscle use. Heart: RRR,  no murmur.  Abdomen:  Not distended, soft, non-tender. No rebound or rigidity.   Lower extremities: no pretibial edema bilaterally.  Few varicose veins around the right knee.  No evidence of phlebitis. Skin: Exposed areas without rash. Not pale. Not jaundice Neurologic:  alert & oriented X3.  Speech normal, gait appropriate for age and unassisted Strength symmetric and appropriate for age.  Psych: Cognition and judgment  appear intact.  Cooperative with normal attention span and concentration.  Behavior appropriate. No anxious or depressed appearing.     Assessment     Assessment Prediabetes HTN Hyperlipidemia  depression, anxiety OSA 2014, Dr Halford Chessman Hematology Dr.Ennever, heterozygous for factor V Leiden --H/o  PE, DVT 11-2013 after a trip  --Thrombophlebitis 02-2015 -- off xarelto, on ASA 162 since 08-2016 FH CAD, CP:  stress test no ischemia 2011 GU: BPH, LUTS, increased PSA velocity BX 2002  PLAN: Here for CPX Prediabetes: Discussed diet and exercise, check A1c HTN: On no meds, BP satisfactory, recommend to check. Hyperlipidemia: On Lipitor, checking labs Depression anxiety: On Zoloft and Wellbutrin: Doing well. OSA: Uses a CPAP regularly  factor V Ley[place]: On aspirin Varicose veins: Rec use of compression stockings. BPH, LUTS: See comments under physical exam RTC 1 year    This visit occurred during the SARS-CoV-2 public health emergency.  Safety protocols were in place, including screening questions prior to the visit, additional usage of staff PPE, and extensive cleaning of exam room while observing appropriate contact time as indicated for disinfecting solutions.

## 2020-09-07 NOTE — Patient Instructions (Addendum)
Check the  blood pressure once a month.   BP GOAL is between 110/65 and  135/85. If it is consistently higher or lower, let me know   Please call the gastroenterology office at  [phone]   to reschedule your colonoscopy.    GO TO THE LAB : Get the blood work     [place], [place] back for the next shingles shot in 2 to 3 months  Come back for a physical exam in 1 year   "Living will", "Alger of attorney": Advanced care planning  (If you already have a living will or healthcare power of attorney, please bring the copy to be scanned in your chart.)  Advance care planning is a process that supports adults in  understanding and sharing their preferences regarding future medical care.   The patient's preferences are recorded in documents called Advance Directives.    Advanced directives are completed (and can be modified at any time) while the patient is in full mental capacity.   The documentation should be available at all times to the patient, the family and the healthcare providers.  Bring in a copy to be scanned in your chart is an excellent idea and is recommended   This legal documents direct treatment decision making and/or appoint a surrogate to make the decision if the patient is not capable to do so.    Advance directives can be documented in many types of formats,  documents have names such as:  Lliving will  Durable power of attorney for healthcare (healthcare proxy or healthcare power of attorney)  Combined directives  Physician orders for life-sustaining treatment    More information at:  [email or web address]

## 2020-09-08 ENCOUNTER — Encounter: Payer: Self-pay | Admitting: Internal Medicine

## 2020-09-08 NOTE — Assessment & Plan Note (Signed)
Here for CPX Prediabetes: Discussed diet and exercise, check A1c HTN: On no meds, BP satisfactory, recommend to check. Hyperlipidemia: On Lipitor, checking labs Depression anxiety: On Zoloft and Wellbutrin: Doing well. OSA: Uses a CPAP regularly  factor V Leyden: On aspirin Varicose veins: Rec use of compression stockings. BPH, LUTS: See comments under physical exam RTC 1 year

## 2020-09-08 NOTE — Assessment & Plan Note (Signed)
-  Td 2013 -Covid shots x4 -shingrix d/w pt.  #1 today, RTC in 2 to 3 months. -CCS: Colonoscopy 08/2008, tubular adenoma, colonoscopy 2015, missed cscope 04-2020,rec to call  GI. -Prostate cancer screening: PSA was elevated 08-2019 at 5.4 recheck 01-2020 at urology: 1.5. Had a BX of the prostate 02/21/2020: Benign. LUTS were noted. Next visit w/ urology 02-2021 -(+)FH of CAD :asx -Patient education: Diet, exercise, healthcare power of attorney.

## 2020-10-21 ENCOUNTER — Other Ambulatory Visit: Payer: Self-pay | Admitting: Internal Medicine

## 2020-11-07 ENCOUNTER — Ambulatory Visit (INDEPENDENT_AMBULATORY_CARE_PROVIDER_SITE_OTHER): Payer: BC Managed Care – PPO

## 2020-11-07 ENCOUNTER — Other Ambulatory Visit: Payer: Self-pay

## 2020-11-07 DIAGNOSIS — Z23 Encounter for immunization: Secondary | ICD-10-CM | POA: Diagnosis not present

## 2020-11-07 NOTE — Progress Notes (Signed)
Pt is here today for 2nd shingrix vaccine. Pt was given shingrix in left deltoid. Pt tolerated well.

## 2021-02-13 ENCOUNTER — Encounter: Payer: Self-pay | Admitting: Gastroenterology

## 2021-02-21 DIAGNOSIS — N401 Enlarged prostate with lower urinary tract symptoms: Secondary | ICD-10-CM | POA: Diagnosis not present

## 2021-02-21 DIAGNOSIS — R3912 Poor urinary stream: Secondary | ICD-10-CM | POA: Diagnosis not present

## 2021-02-28 DIAGNOSIS — R350 Frequency of micturition: Secondary | ICD-10-CM | POA: Diagnosis not present

## 2021-02-28 DIAGNOSIS — N401 Enlarged prostate with lower urinary tract symptoms: Secondary | ICD-10-CM | POA: Diagnosis not present

## 2021-02-28 DIAGNOSIS — R972 Elevated prostate specific antigen [PSA]: Secondary | ICD-10-CM | POA: Diagnosis not present

## 2021-02-28 DIAGNOSIS — R3912 Poor urinary stream: Secondary | ICD-10-CM | POA: Diagnosis not present

## 2021-03-22 ENCOUNTER — Other Ambulatory Visit: Payer: Self-pay

## 2021-03-22 ENCOUNTER — Telehealth: Payer: BC Managed Care – PPO | Admitting: Internal Medicine

## 2021-03-22 ENCOUNTER — Encounter: Payer: Self-pay | Admitting: Internal Medicine

## 2021-03-22 VITALS — Ht 70.0 in

## 2021-03-26 ENCOUNTER — Encounter: Payer: Self-pay | Admitting: Internal Medicine

## 2021-04-09 ENCOUNTER — Ambulatory Visit (AMBULATORY_SURGERY_CENTER): Payer: Self-pay | Admitting: *Deleted

## 2021-04-09 ENCOUNTER — Other Ambulatory Visit: Payer: Self-pay

## 2021-04-09 VITALS — Ht 69.0 in | Wt 238.0 lb

## 2021-04-09 DIAGNOSIS — Z86010 Personal history of colonic polyps: Secondary | ICD-10-CM

## 2021-04-09 MED ORDER — NA SULFATE-K SULFATE-MG SULF 17.5-3.13-1.6 GM/177ML PO SOLN: 1.0000 | Freq: Once | ORAL | 0 refills | Status: AC

## 2021-04-09 NOTE — Progress Notes (Signed)
No egg or soy allergy known to patient  No issues known to pt with past sedation with any surgeries or procedures Patient denies ever being told they had issues or difficulty with intubation  No FH of Malignant Hyperthermia Pt is not on diet pills Pt is not on  home 02  Pt is not on blood thinners  Pt denies issues with constipation  No A fib or A flutter  Pt is fully vaccinated  for Covid   NO PA's for preps discussed with pt In PV today  Discussed with pt there will be an out-of-pocket cost for prep and that varies from $0 to 70 +  dollars - pt verbalized understanding   Due to the COVID-19 pandemic we are asking patients to follow certain guidelines in PV and the [place]   Pt aware of COVID protocols and [facility] guidelines

## 2021-04-15 ENCOUNTER — Encounter: Payer: Self-pay | Admitting: Gastroenterology

## 2021-04-23 ENCOUNTER — Ambulatory Visit (AMBULATORY_SURGERY_CENTER): Payer: BC Managed Care – PPO | Admitting: Gastroenterology

## 2021-04-23 ENCOUNTER — Encounter: Payer: Self-pay | Admitting: Gastroenterology

## 2021-04-23 ENCOUNTER — Other Ambulatory Visit: Payer: Self-pay

## 2021-04-23 VITALS — BP 114/73 | HR 77 | Temp 96.8°F | Resp 13 | Ht 70.0 in | Wt 238.0 lb

## 2021-04-23 DIAGNOSIS — Z86010 Personal history of colonic polyps: Secondary | ICD-10-CM | POA: Diagnosis not present

## 2021-04-23 DIAGNOSIS — D122 Benign neoplasm of ascending colon: Secondary | ICD-10-CM | POA: Diagnosis not present

## 2021-04-23 DIAGNOSIS — D123 Benign neoplasm of transverse colon: Secondary | ICD-10-CM | POA: Diagnosis not present

## 2021-04-23 DIAGNOSIS — Z1211 Encounter for screening for malignant neoplasm of colon: Secondary | ICD-10-CM | POA: Diagnosis not present

## 2021-04-23 MED ORDER — SODIUM CHLORIDE 0.9 % IV SOLN: 500.0000 mL | Freq: Once | INTRAVENOUS | Status: DC

## 2021-04-23 NOTE — Progress Notes (Signed)
History & Physical  Primary Care Physician:  Colon Branch, MD Primary Gastroenterologist: Lucio Edward, MD  CHIEF COMPLAINT:  Personal history of colon polyps   HPI: Oscar Fuller is a 65 y.o. male with a personal history of adenomatous colon polyps for surveillance colonoscopy.  Last colonoscopy in 2015.   Past Medical History:  Diagnosis Date   Anxiety    Atypical chest pain    with ER visit in 8/11. ETT-myoview showed EF 61% no ischemia or infaraction 8/11   Clotting disorder ([facility]CC)    DVT 2015   Depression    DVT (deep venous thrombosis) ([facility])    ED (erectile dysfunction) 07/2008   used some cialis, symptoms not active at present    Hyperlipidemia    Hypertension    was on ACEi-diuretics   Increased prostate specific antigen (PSA) velocity    saw Dr Joelyn Oms, s/p Bx(-) 2002,2022   Prediabetes 09/07/2013   Pulmonary embolism ([place]) 11/2013   PE and L DVT after a trip   SCC (squamous cell carcinoma)    Dr Sherrye Payor, in the back   Sleep apnea    HST 05/2012:  AHI 34/hr with desat as low as 69%.    Tubular adenoma of colon 08/2008    Past Surgical History:  Procedure Laterality Date   COLONOSCOPY     KNEE SURGERY  2009   left arthroscopy   POLYPECTOMY     PROSTATE BIOPSY  02/2020   benign, biopsy x 2 2002 and 2021 or 2022 both benign   VASECTOMY      Prior to Admission medications   Medication Sig Start Date End Date Taking? Authorizing Provider  Aspirin 162.5 MG CP24 Take 162 mg by mouth daily.   Yes [provider]  atorvastatin (LIPITOR) 40 MG tablet Take 1 tablet (40 mg total) by mouth daily. 12/13/18  Yes Paz, Alda Berthold, MD  buPROPion (WELLBUTRIN) 100 MG tablet Take 1 tablet (100 mg total) by mouth 2 (two) times daily. 12/13/18  Yes Paz, Alda Berthold, MD  Multiple Vitamin (MULTIVITAMIN) tablet Take 1 tablet by mouth daily. Men's once a day   Yes [provider]  sertraline (ZOLOFT) 100 MG tablet Take 1 tablet (100 mg total) by mouth daily. 10/22/20  Yes Paz,  Alda Berthold, MD  tamsulosin (FLOMAX) 0.4 MG CAPS capsule Take 0.4 mg by mouth daily. 09/04/20  Yes [provider]    Current Outpatient Medications  Medication Sig Dispense Refill   Aspirin 162.5 MG CP24 Take 162 mg by mouth daily.     atorvastatin (LIPITOR) 40 MG tablet Take 1 tablet (40 mg total) by mouth daily. 90 tablet 0   buPROPion (WELLBUTRIN) 100 MG tablet Take 1 tablet (100 mg total) by mouth 2 (two) times daily. 180 tablet 0   Multiple Vitamin (MULTIVITAMIN) tablet Take 1 tablet by mouth daily. Men's once a day     sertraline (ZOLOFT) 100 MG tablet Take 1 tablet (100 mg total) by mouth daily. 90 tablet 3   tamsulosin (FLOMAX) 0.4 MG CAPS capsule Take 0.4 mg by mouth daily.     Current Facility-Administered Medications  Medication Dose Route Frequency Provider Last Rate Last Admin   0.9 %  sodium chloride infusion  500 mL Intravenous Once Ladene Artist, MD        Allergies as of 04/23/2021   (No Known Allergies)    Family History  Problem Relation Age of Onset   Glaucoma Mother  Heart disease Mother    Coronary artery disease Father        stent 54, GF GM   Hypertension Father        F B   Lung cancer Father    Migraines Sister    Heart attack Brother        F B M GM   Gout Brother    Stomach cancer Paternal Grandfather    Stroke Other        CVA GM   Colon cancer Neg Hx    Prostate cancer Neg Hx    Colon polyps Neg Hx    Esophageal cancer Neg Hx    Rectal cancer Neg Hx     Social History   Socioeconomic History   Marital status: Married    Spouse name: [name]   Number of children: 2   Years of education: BS   Highest education level: Not on file  Occupational History   Occupation: Psychologist, prison and probation services, Press photographer, Production manager   Tobacco Use   Smoking status: Never   Smokeless tobacco: Never  Vaping Use   Vaping Use: Never used  Substance and Sexual Activity   Alcohol use: No    Alcohol/week: 0.0 standard drinks    Comment:     Drug use: No   Sexual  activity: Not on file  Other Topics Concern   Not on file  Social History Narrative   Married x 2  , 2nd wife [name] has a h/o SZ d/o    2 daughters : Therapist, sports RT      Social Determinants of Health   Financial Resource Strain: Not on file  Food Insecurity: Not on file  Transportation Needs: Not on file  Physical Activity: Not on file  Stress: Not on file  Social Connections: Not on file  Intimate Partner Violence: Not on file    Review of Systems:  All systems reviewed an negative except where noted in HPI.  Gen: Denies any fever, chills, sweats, anorexia, fatigue, weakness, malaise, weight loss, and sleep disorder CV: Denies chest pain, angina, palpitations, syncope, orthopnea, PND, peripheral edema, and claudication. Resp: Denies dyspnea at rest, dyspnea with exercise, cough, sputum, wheezing, coughing up blood, and pleurisy. GI: Denies vomiting blood, jaundice, and fecal incontinence.   Denies dysphagia or odynophagia. GU : Denies urinary burning, blood in urine, urinary frequency, urinary hesitancy, nocturnal urination, and urinary incontinence. MS: Denies joint pain, limitation of movement, and swelling, stiffness, low back pain, extremity pain. Denies muscle weakness, cramps, atrophy.  Derm: Denies rash, itching, dry skin, hives, moles, warts, or unhealing ulcers.  Psych: Denies depression, anxiety, memory loss, suicidal ideation, hallucinations, paranoia, and confusion. Heme: Denies bruising, bleeding, and enlarged lymph nodes. Neuro:  Denies any headaches, dizziness, paresthesias. Endo:  Denies any problems with DM, thyroid, adrenal function.   Physical Exam: General:  Alert, well-developed, in NAD Head:  Normocephalic and atraumatic. Eyes:  Sclera clear, no icterus.   Conjunctiva pink. Ears:  Normal auditory acuity. Mouth:  No deformity or lesions.  Neck:  Supple; no masses . Lungs:  Clear throughout to auscultation.   No wheezes, crackles, or rhonchi. No acute  distress. Heart:  Regular rate and rhythm; no murmurs. Abdomen:  Soft, nondistended, nontender. No masses, hepatomegaly. No obvious masses.  Normal bowel .    Rectal:  Deferred   Msk:  Symmetrical without gross deformities.. Pulses:  Normal pulses noted. Extremities:  Without edema. Neurologic:  Alert and  oriented x4;  grossly normal neurologically. Skin:  Intact without significant lesions or rashes. Cervical Nodes:  No significant cervical adenopathy. Psych:  Alert and cooperative. Normal mood and affect.   Impression / Plan:   Personal history of adenomatous colon polyps for surveillance colonoscopy.  Last colonoscopy in 2015.   Oscar Fuller. Fuller Plan  04/23/2021, 11:04 AM See Shea Evans, [facility], to contact our on call provider

## 2021-04-23 NOTE — Op Note (Signed)
[facility] Patient Name: Oscar Fuller Procedure Date: 04/23/2021 11:04 AM MRN: [id number] Endoscopist: Ladene Artist , MD Age: 65 Referring MD:  Date of Birth: 1956/05/08 Gender: Male Account #: [id number] Procedure:                Colonoscopy Indications:              Surveillance: Personal history of adenomatous                            polyps on last colonoscopy > 5 years ago Medicines:                Monitored Anesthesia Care Procedure:                Pre-Anesthesia Assessment:                           - Prior to the procedure, a History and Physical                            was performed, and patient medications and                            allergies were reviewed. The patient's tolerance of                            previous anesthesia was also reviewed. The risks                            and benefits of the procedure and the sedation                            options and risks were discussed with the patient.                            All questions were answered, and informed consent                            was obtained. Prior Anticoagulants: The patient has                            taken no previous anticoagulant or antiplatelet                            agents. ASA Grade Assessment: II - A patient with                            mild systemic disease. After reviewing the risks                            and benefits, the patient was deemed in                            satisfactory condition to undergo the procedure.  After obtaining informed consent, the colonoscope                            was passed under direct vision. Throughout the                            procedure, the patient's blood pressure, pulse, and                            oxygen saturations were monitored continuously. The                            Olympus CF-HQ190L [id number] was introduced through                            the anus and advanced to  the the cecum, identified                            by appendiceal orifice and ileocecal valve. The                            ileocecal valve, appendiceal orifice, and rectum                            were photographed. The quality of the bowel                            preparation was good. The colonoscopy was performed                            without difficulty. The patient tolerated the                            procedure well. Scope In: 11:07:38 AM Scope Out: 11:21:08 AM Scope Withdrawal Time: 0 hours 11 minutes 56 seconds  Total Procedure Duration: 0 hours 13 minutes 30 seconds  Findings:                 The perianal and digital rectal examinations were                            normal.                           A 4 mm polyp was found in the ascending colon. The                            polyp was sessile. The polyp was removed with a                            cold biopsy forceps. Resection and retrieval were                            complete.  Three sessile polyps were found in the transverse                            colon. The polyps were 4 to 8 mm in size. These                            polyps were removed with a cold snare. Resection                            and retrieval were complete.                           Multiple small-mouthed diverticula were found in                            the left colon. There was no evidence of                            diverticular bleeding.                           Internal hemorrhoids were found during                            retroflexion. The hemorrhoids were small and Grade                            I (internal hemorrhoids that do not prolapse).                           The exam was otherwise without abnormality on                            direct and retroflexion views. Complications:            No immediate complications. Estimated blood loss:                            None. Estimated  Blood Loss:     Estimated blood loss: none. Impression:               - One 4 mm polyp in the ascending colon, removed                            with a cold biopsy forceps. Resected and retrieved.                           - Three 4 to 8 mm polyps in the transverse colon,                            removed with a cold snare. Resected and retrieved.                           - Mild diverticulosis in the left colon.                           -  Internal hemorrhoids.                           - The examination was otherwise normal on direct                            and retroflexion views. Recommendation:           - Repeat colonoscopy after studies are complete for                            surveillance based on pathology results.                           - Patient has a contact number available for                            emergencies. The signs and symptoms of potential                            delayed complications were discussed with the                            patient. Return to normal activities tomorrow.                            Written discharge instructions were provided to the                            patient.                           - High fiber diet.                           - Continue present medications.                           - Await pathology results. Ladene Artist, MD 04/23/2021 11:31:51 AM This report has been signed electronically.

## 2021-04-23 NOTE — Progress Notes (Signed)
Called to room to assist during endoscopic procedure.  Patient ID and intended procedure confirmed with present staff. Received instructions for my participation in the procedure from the performing physician.  

## 2021-04-23 NOTE — Progress Notes (Signed)
Pt's states no medical or surgical changes since previsit or office visit. VS by [facility] 

## 2021-04-23 NOTE — Progress Notes (Signed)
Report given to PACU, vss 

## 2021-04-23 NOTE — Patient Instructions (Signed)
Handouts given on Polyps, Diverticulosis, and hemorrhoids.  YOU HAD AN ENDOSCOPIC PROCEDURE TODAY AT [facility]:   Refer to the procedure report that was given to you for any specific questions about what was found during the examination.  If the procedure report does not answer your questions, please call your gastroenterologist to clarify.  If you requested that your care partner not be given the details of your procedure findings, then the procedure report has been included in a sealed envelope for you to review at your convenience later.  YOU SHOULD EXPECT: Some feelings of bloating in the abdomen. Passage of more gas than usual.  Walking can help get rid of the air that was put into your GI tract during the procedure and reduce the bloating. If you had a lower endoscopy (such as a colonoscopy or flexible sigmoidoscopy) you may notice spotting of blood in your stool or on the toilet paper. If you underwent a bowel prep for your procedure, you may not have a normal bowel movement for a few days.  Please Note:  You might notice some irritation and congestion in your nose or some drainage.  This is from the oxygen used during your procedure.  There is no need for concern and it should clear up in a day or so.  SYMPTOMS TO REPORT IMMEDIATELY:  Following lower endoscopy (colonoscopy or flexible sigmoidoscopy):  Excessive amounts of blood in the stool  Significant tenderness or worsening of abdominal pains  Swelling of the abdomen that is new, acute  Fever of 100F or higher  For urgent or emergent issues, a gastroenterologist can be reached at any hour by calling [phone]. Do not use MyChart messaging for urgent concerns.    DIET:  We do recommend a small meal at first, but then you may proceed to your regular diet.  Drink plenty of fluids but you should avoid alcoholic beverages for 24 hours.  ACTIVITY:  You should plan to take it easy for the rest of today and you  should NOT DRIVE or use heavy machinery until tomorrow (because of the sedation medicines used during the test).    FOLLOW UP: Our staff will call the number listed on your records 48-72 hours following your procedure to check on you and address any questions or concerns that you may have regarding the information given to you following your procedure. If we do not reach you, we will leave a message.  We will attempt to reach you two times.  During this call, we will ask if you have developed any symptoms of COVID 19. If you develop any symptoms (ie: fever, flu-like symptoms, shortness of breath, cough etc.) before then, please call [phone].  If you test positive for Covid 19 in the 2 weeks post procedure, please call and report this information to [place].    If any biopsies were taken you will be contacted by phone or by letter within the next 1-3 weeks.  Please call us at [phone] if you have not heard about the biopsies in 3 weeks.    SIGNATURES/CONFIDENTIALITY: You and/or your care partner have signed paperwork which will be entered into your electronic medical record.  These signatures attest to the fact that that the information above on your After Visit Summary has been reviewed and is understood.  Full responsibility of the confidentiality of this discharge information lies with you and/or your care-partner.

## 2021-04-25 ENCOUNTER — Telehealth: Payer: Self-pay | Admitting: *Deleted

## 2021-04-25 NOTE — Telephone Encounter (Signed)
°  Follow up Call-  Call back number 04/23/2021  Post procedure Call Back phone  # [phone]  Permission to leave phone message Yes  Some recent data might be hidden     Patient questions: Message left to call if necessary.

## 2021-05-14 ENCOUNTER — Encounter: Payer: Self-pay | Admitting: Gastroenterology

## 2021-05-20 ENCOUNTER — Ambulatory Visit
Admission: EM | Admit: 2021-05-20 | Discharge: 2021-05-20 | Disposition: A | Discharge status: Home / Self Care | Payer: BC Managed Care – PPO | Attending: Urgent Care | Admitting: Urgent Care

## 2021-05-20 DIAGNOSIS — J3489 Other specified disorders of nose and nasal sinuses: Secondary | ICD-10-CM

## 2021-05-20 DIAGNOSIS — Z86711 Personal history of pulmonary embolism: Secondary | ICD-10-CM | POA: Diagnosis not present

## 2021-05-20 DIAGNOSIS — J069 Acute upper respiratory infection, unspecified: Secondary | ICD-10-CM | POA: Diagnosis not present

## 2021-05-20 MED ORDER — PSEUDOEPHEDRINE HCL 30 MG PO TABS: 30.0000 mg | ORAL_TABLET | Freq: Three times a day (TID) | ORAL | 0 refills | Status: DC | PRN

## 2021-05-20 MED ORDER — BENZONATATE 100 MG PO CAPS
100.0000 mg | ORAL_CAPSULE | Freq: Three times a day (TID) | ORAL | 0 refills | Status: DC | PRN
Start: 2021-05-20 — End: 2022-03-04

## 2021-05-20 MED ORDER — CETIRIZINE HCL 10 MG PO TABS
10.0000 mg | ORAL_TABLET | Freq: Every day | ORAL | 0 refills | Status: DC
Start: 2021-05-20 — End: 2023-05-08

## 2021-05-20 MED ORDER — PROMETHAZINE-DM 6.25-15 MG/5ML PO SYRP: 5.0000 mL | ORAL_SOLUTION | Freq: Every evening | ORAL | 0 refills | Status: DC | PRN

## 2021-05-20 NOTE — ED Provider Notes (Signed)
Oscar Fuller   MRN: [id number] DOB: 10-20-1955  Subjective:   Oscar Fuller is a 66 y.o. male presenting for 2 to 3-day history of acute onset sinus pressure, sinus congestion, sinus headaches, malaise and fatigue and a really bad cough.  No fevers, chest pain, shortness of breath, body aches, throat pain, ear pain.  Has used over-the-counter medication with minimal relief.  Patient is not a smoker. Has a history of PE bilaterally, is doing aspirin 325mg  daily. No history of respiratory disorders.   No current facility-administered medications for this encounter.  Current Outpatient Medications:    Aspirin 162.5 MG CP24, Take 162 mg by mouth daily., Disp: , Rfl:    atorvastatin (LIPITOR) 40 MG tablet, Take 1 tablet (40 mg total) by mouth daily., Disp: 90 tablet, Rfl: 0   buPROPion (WELLBUTRIN) 100 MG tablet, Take 1 tablet (100 mg total) by mouth 2 (two) times daily., Disp: 180 tablet, Rfl: 0   Multiple Vitamin (MULTIVITAMIN) tablet, Take 1 tablet by mouth daily. Men's once a day, Disp: , Rfl:    sertraline (ZOLOFT) 100 MG tablet, Take 1 tablet (100 mg total) by mouth daily., Disp: 90 tablet, Rfl: 3   tamsulosin (FLOMAX) 0.4 MG CAPS capsule, Take 0.4 mg by mouth daily., Disp: , Rfl:    No Known Allergies  Past Medical History:  Diagnosis Date   Anxiety    Atypical chest pain    with ER visit in 8/11. ETT-myoview showed EF 61% no ischemia or infaraction 8/11   Clotting disorder (HCC)    DVT 2015   Depression    DVT (deep venous thrombosis) (HCC)    ED (erectile dysfunction) 07/2008   used some cialis, symptoms not active at present    Hyperlipidemia    Hypertension    was on ACEi-diuretics   Increased prostate specific antigen (PSA) velocity    saw Dr Joelyn Oms, s/p Bx(-) 2002,2022   Prediabetes 09/07/2013   Pulmonary embolism ([place]) 11/2013   PE and L DVT after a trip   SCC (squamous cell carcinoma)    Dr Sherrye Payor, in the back   Sleep apnea    HST 05/2012:   AHI 34/hr with desat as low as 69%.    Tubular adenoma of colon 08/2008     Past Surgical History:  Procedure Laterality Date   COLONOSCOPY     KNEE SURGERY  2009   left arthroscopy   POLYPECTOMY     PROSTATE BIOPSY  02/2020   benign, biopsy x 2 2002 and 2021 or 2022 both benign   VASECTOMY      Family History  Problem Relation Age of Onset   Glaucoma Mother    Heart disease Mother    Coronary artery disease Father        stent 35, GF GM   Hypertension Father        F B   Lung cancer Father    Migraines Sister    Heart attack Brother        F B M GM   Gout Brother    Stomach cancer Paternal Grandfather    Stroke Other        CVA GM   Colon cancer Neg Hx    Prostate cancer Neg Hx    Colon polyps Neg Hx    Esophageal cancer Neg Hx    Rectal cancer Neg Hx     Social History   Tobacco Use   Smoking status: Never  Smokeless tobacco: Never  Vaping Use   Vaping Use: Never used  Substance Use Topics   Alcohol use: No    Alcohol/week: 0.0 standard drinks    Comment:     Drug use: No    ROS   Objective:   Vitals: BP (!) 143/94 (BP Location: Left Arm)    Pulse 80    Temp 98.5 F (36.9 C) (Oral)    Resp 18    SpO2 100%   Physical Exam Constitutional:      General: He is not in acute distress.    Appearance: Normal appearance. He is well-developed and normal weight. He is not ill-appearing, toxic-appearing or diaphoretic.  HENT:     Head: Normocephalic and atraumatic.     Right Ear: Tympanic membrane, ear canal and external ear normal. There is no impacted cerumen.     Left Ear: Tympanic membrane, ear canal and external ear normal. There is no impacted cerumen.     Nose: Congestion present. No rhinorrhea.     Mouth/Throat:     Mouth: Mucous membranes are moist.     Pharynx: No oropharyngeal exudate or posterior oropharyngeal erythema.     Comments: Significant post-nasal drainage overlying pharynx.  Eyes:     General: No scleral icterus.       Right  eye: No discharge.        Left eye: No discharge.     Extraocular Movements: Extraocular movements intact.     Conjunctiva/sclera: Conjunctivae normal.  Cardiovascular:     Rate and Rhythm: Normal rate and regular rhythm.     Heart sounds: Normal heart sounds. No murmur heard.   No friction rub. No gallop.  Pulmonary:     Effort: Pulmonary effort is normal. No respiratory distress.     Breath sounds: Normal breath sounds. No stridor. No wheezing, rhonchi or rales.  Musculoskeletal:     Cervical back: Normal range of motion and neck supple. No rigidity. No muscular tenderness.  Neurological:     General: No focal deficit present.     Mental Status: He is alert and oriented to person, place, and time.  Psychiatric:        Mood and Affect: Mood normal.        Behavior: Behavior normal.        Thought Content: Thought content normal.    Assessment and Plan :   PDMP not reviewed this encounter.  1. Viral URI with cough   2. Sinus pressure   3. History of pulmonary embolism    Patient declined a COVID test. Deferred imaging given clear cardiopulmonary exam, hemodynamically stable vital signs. Suspect viral URI, viral syndrome. Physical exam findings reassuring and vital signs stable for discharge. Advised supportive care, offered symptomatic relief. Counseled patient on potential for adverse effects with medications prescribed/recommended today, ER and return-to-clinic precautions discussed, patient verbalized understanding.     Jaynee Eagles, [place] 05/20/21 1337

## 2021-05-20 NOTE — ED Triage Notes (Signed)
Pt presents for sinus pressure,headache and fatigue x 2-3 days.

## 2021-05-20 NOTE — Discharge Instructions (Addendum)
We will manage this as a viral syndrome. For sore throat or cough try using a honey-based tea. Use 3 teaspoons of honey with juice squeezed from half lemon. Place shaved pieces of ginger into 1/2-1 cup of water and warm over stove top. Then mix the ingredients and repeat every 4 hours as needed. Please take Tylenol 500mg -650mg  once every 6 hours for fevers, aches and pains. Hydrate very well with at least 2 liters (64 ounces) of water. Eat light meals such as soups (chicken and noodles, chicken wild rice, vegetable).  Do not eat any foods that you are allergic to.  Start an antihistamine like Zyrtec for postnasal drainage, sinus congestion.  You can take this together with pseudoephedrine (Sudafed) at a dose of 30 mg 3 times a day or twice daily as needed for the same kind of congestion.  Use the cough suppression medications as needed.

## 2021-09-09 NOTE — Progress Notes (Signed)
No show

## 2021-09-10 ENCOUNTER — Encounter: Payer: BC Managed Care – PPO | Admitting: Internal Medicine

## 2021-09-10 ENCOUNTER — Encounter: Payer: Self-pay | Admitting: Internal Medicine

## 2021-11-13 DIAGNOSIS — M25562 Pain in left knee: Secondary | ICD-10-CM | POA: Diagnosis not present

## 2022-02-10 ENCOUNTER — Telehealth: Payer: Self-pay | Admitting: Internal Medicine

## 2022-02-10 DIAGNOSIS — R739 Hyperglycemia, unspecified: Secondary | ICD-10-CM

## 2022-02-10 DIAGNOSIS — Z0000 Encounter for general adult medical examination without abnormal findings: Secondary | ICD-10-CM

## 2022-02-10 NOTE — Telephone Encounter (Signed)
Please advise 

## 2022-02-10 NOTE — Telephone Encounter (Signed)
CMP, FLP, CBC, A1c, TSH, PSA

## 2022-02-10 NOTE — Telephone Encounter (Signed)
Orders placed. Please schedule labs several days prior to CPX appt.

## 2022-02-10 NOTE — Telephone Encounter (Signed)
Pt called to set up CPE and was wondering if orders can be put in for labs to be scheduled at least a week prior.

## 2022-02-11 NOTE — Telephone Encounter (Signed)
Pt called and scheduled for labs one week prior to CPE

## 2022-02-25 ENCOUNTER — Other Ambulatory Visit (INDEPENDENT_AMBULATORY_CARE_PROVIDER_SITE_OTHER): Payer: BC Managed Care – PPO

## 2022-02-25 DIAGNOSIS — R739 Hyperglycemia, unspecified: Secondary | ICD-10-CM | POA: Diagnosis not present

## 2022-02-25 DIAGNOSIS — Z0000 Encounter for general adult medical examination without abnormal findings: Secondary | ICD-10-CM

## 2022-02-25 LAB — CBC WITH DIFFERENTIAL/PLATELET
Basophils Absolute: 0.1 10*3/uL (ref 0.0–0.1)
Basophils Relative: 0.7 % (ref 0.0–3.0)
Eosinophils Absolute: 0.3 10*3/uL (ref 0.0–0.7)
Eosinophils Relative: 3.7 % (ref 0.0–5.0)
HCT: 47.7 % (ref 39.0–52.0)
Hemoglobin: 16.1 g/dL (ref 13.0–17.0)
Lymphocytes Relative: 35.3 % (ref 12.0–46.0)
Lymphs Abs: 2.7 10*3/uL (ref 0.7–4.0)
MCHC: 33.8 g/dL (ref 30.0–36.0)
MCV: 89.7 fl (ref 78.0–100.0)
Monocytes Absolute: 0.7 10*3/uL (ref 0.1–1.0)
Monocytes Relative: 9 % (ref 3.0–12.0)
Neutro Abs: 3.9 10*3/uL (ref 1.4–7.7)
Neutrophils Relative %: 51.3 % (ref 43.0–77.0)
Platelets: 179 10*3/uL (ref 150.0–400.0)
RBC: 5.32 Mil/uL (ref 4.22–5.81)
RDW: 13.6 % (ref 11.5–15.5)
WBC: 7.6 10*3/uL (ref 4.0–10.5)

## 2022-02-25 LAB — TSH: TSH: 1.57 u[IU]/mL (ref 0.35–5.50)

## 2022-02-25 LAB — COMPREHENSIVE METABOLIC PANEL
ALT: 28 U/L (ref 0–53)
AST: 29 U/L (ref 0–37)
Albumin: 4.3 g/dL (ref 3.5–5.2)
Alkaline Phosphatase: 86 U/L (ref 39–117)
BUN: 15 mg/dL (ref 6–23)
CO2: 28 mEq/L (ref 19–32)
Calcium: 9.2 mg/dL (ref 8.4–10.5)
Chloride: 105 mEq/L (ref 96–112)
Creatinine, Ser: 1.21 mg/dL (ref 0.40–1.50)
GFR: 63.34 mL/min (ref >60.00)
Glucose, Bld: 111 mg/dL — ABNORMAL HIGH (ref 70–99)
Potassium: 4.2 mEq/L (ref 3.5–5.1)
Sodium: 141 mEq/L (ref 135–145)
Total Bilirubin: 0.8 mg/dL (ref 0.2–1.2)
Total Protein: 6.6 g/dL (ref 6.0–8.3)

## 2022-02-25 LAB — LIPID PANEL
Cholesterol: 202 mg/dL — ABNORMAL HIGH (ref 0–200)
HDL: 41.6 mg/dL (ref >39.00)
LDL Cholesterol: 130 mg/dL — ABNORMAL HIGH (ref 0–99)
NonHDL: 160.26
Total CHOL/HDL Ratio: 5
Triglycerides: 150 mg/dL — ABNORMAL HIGH (ref 0.0–149.0)
VLDL: 30 mg/dL (ref 0.0–40.0)

## 2022-02-25 LAB — HEMOGLOBIN A1C: Hgb A1c MFr Bld: 5.8 % (ref 4.6–6.5)

## 2022-02-25 LAB — PSA: PSA: 1.15 ng/mL (ref 0.10–4.00)

## 2022-03-04 ENCOUNTER — Ambulatory Visit (INDEPENDENT_AMBULATORY_CARE_PROVIDER_SITE_OTHER): Payer: BC Managed Care – PPO | Admitting: Internal Medicine

## 2022-03-04 ENCOUNTER — Encounter: Payer: Self-pay | Admitting: Internal Medicine

## 2022-03-04 VITALS — BP 118/84 | HR 90 | Temp 98.2°F | Resp 16 | Ht 70.0 in | Wt 232.0 lb

## 2022-03-04 DIAGNOSIS — Z0001 Encounter for general adult medical examination with abnormal findings: Secondary | ICD-10-CM | POA: Diagnosis not present

## 2022-03-04 DIAGNOSIS — F32A Depression, unspecified: Secondary | ICD-10-CM

## 2022-03-04 DIAGNOSIS — Z0000 Encounter for general adult medical examination without abnormal findings: Secondary | ICD-10-CM

## 2022-03-04 DIAGNOSIS — E785 Hyperlipidemia, unspecified: Secondary | ICD-10-CM | POA: Diagnosis not present

## 2022-03-04 DIAGNOSIS — Z23 Encounter for immunization: Secondary | ICD-10-CM | POA: Diagnosis not present

## 2022-03-04 DIAGNOSIS — F419 Anxiety disorder, unspecified: Secondary | ICD-10-CM | POA: Diagnosis not present

## 2022-03-04 MED ORDER — SERTRALINE HCL 100 MG PO TABS: 100.0000 mg | ORAL_TABLET | Freq: Every day | ORAL | 1 refills | Status: DC

## 2022-03-04 MED ORDER — BUPROPION HCL 100 MG PO TABS: 100.0000 mg | ORAL_TABLET | Freq: Two times a day (BID) | ORAL | 1 refills | Status: DC

## 2022-03-04 MED ORDER — ATORVASTATIN CALCIUM 40 MG PO TABS: 40.0000 mg | ORAL_TABLET | Freq: Every day | ORAL | 1 refills | Status: DC

## 2022-03-04 NOTE — Patient Instructions (Addendum)
Vaccines I recommend:  Covid booster   Go back on Lipitor, we sent the prescription  Please call to see a counselor    [place], [place] back for checkup in 4 months      Advanced care planning:  Do you have a "Living will", "[place] of attorney" ?   If you already have a living will or healthcare power of attorney, is recommended you bring the copy to be scanned in your chart. The document will be available to all the doctors you see in the system.  If you don't have one, please consider create one.  Advance

## 2022-03-04 NOTE — Progress Notes (Unsigned)
Subjective:    Patient ID: Oscar Fuller, male    DOB: 07-04-1955, 66 y.o.   MRN: [id number]  DOS:  03/04/2022 Type of visit - description: CPX Here for CPX. In general doing well. Reports she is under a lot of stress, work-related, also his wife has epilepsy and that worries him. Fortunately, he does not report anxiety or depression per se, simply has a lot on his place and is distressed about it. Self stopped statins about a year ago, memory issues? Occasional cough, no sputum production Review of Systems See above   Past Medical History:  Diagnosis Date   Anxiety    Atypical chest pain    with ER visit in 8/11. ETT-myoview showed EF 61% no ischemia or infaraction 8/11   Clotting disorder (HCC)    DVT 2015   Depression    DVT (deep venous thrombosis) (HCC)    ED (erectile dysfunction) 07/2008   used some cialis, symptoms not active at present    Hyperlipidemia    Hypertension    was on ACEi-diuretics   Increased prostate specific antigen (PSA) velocity    saw Dr Joelyn Oms, s/p Bx(-) 2002,2022   Prediabetes 09/07/2013   Pulmonary embolism ([employer]) 11/2013   PE and L DVT after a trip   SCC (squamous cell carcinoma)    Dr Sherrye Payor, in the back   Sleep apnea    HST 05/2012:  AHI 34/hr with desat as low as 69%.    Tubular adenoma of colon 08/2008    Past Surgical History:  Procedure Laterality Date   COLONOSCOPY     KNEE SURGERY  2009   left arthroscopy   POLYPECTOMY     PROSTATE BIOPSY  02/2020   benign, biopsy x 2 2002 and 2021 or 2022 both benign   VASECTOMY      Current Outpatient Medications  Medication Instructions   Aspirin 162 mg, Oral, Daily   atorvastatin (LIPITOR) 40 mg, Oral, Daily   benzonatate (TESSALON) 100-200 mg, Oral, 3 times daily PRN   buPROPion (WELLBUTRIN) 100 mg, Oral, 2 times daily   cetirizine (ZYRTEC ALLERGY) 10 mg, Oral, Daily   Multiple Vitamin (MULTIVITAMIN) tablet 1 tablet, Oral, Daily, Men's once a day   promethazine-dextromethorphan  (PROMETHAZINE-DM) 6.25-15 MG/5ML syrup 5 mLs, Oral, At bedtime PRN   pseudoephedrine (SUDAFED) 30 mg, Oral, Every 8 hours PRN   sertraline (ZOLOFT) 100 mg, Oral, Daily   tamsulosin (FLOMAX) 0.4 mg, Oral, Daily       Objective:   Physical Exam BP 118/84   Pulse 90   Temp 98.2 F (36.8 C) (Oral)   Resp 16   Ht '5\' 10"'$  (1.778 m)   Wt 232 lb (105.2 kg)   SpO2 97%   BMI 33.29 kg/m  General: Well developed, NAD, BMI noted Neck: No  thyromegaly  HEENT:  Normocephalic . Face symmetric, atraumatic Lungs:  CTA B Normal respiratory effort, no intercostal retractions, no accessory muscle use. Heart: RRR,  no murmur.  Abdomen:  Not distended, soft, non-tender. No rebound or rigidity.   Lower extremities: no pretibial edema bilaterally  Skin: Exposed areas without rash. Not pale. Not jaundice Neurologic:  alert & oriented X3.  Speech normal, gait appropriate for age and unassisted Strength symmetric and appropriate for age.  Psych: Cognition and judgment appear intact.  Cooperative with normal attention span and concentration.  Behavior appropriate. No anxious or depressed appearing.     Assessment    Assessment Prediabetes HTN Hyperlipidemia  depression,  anxiety OSA 2014, Dr Halford [facility] Hematology Dr.Ennever, heterozygous for factor V Leiden --H/o  PE, DVT 11-2013 after a trip  --Thrombophlebitis 02-2015 -- off xarelto, on ASA 162 since 08-2016 FH CAD, CP:  stress test no ischemia 2011 GU: BPH, LUTS, increased PSA velocity BX 2002  PLAN: Here for CPX Recent labs reviewed.  All okay except LDL has increased. HTN: On no BP meds, BP is normal. Hyperlipidemia: Self stopped atorvastatin about a year ago, memory issues?  Memory has actually not getting better after he stopped atorvastatin, he has a family history he is willing to go back on atorvastatin.  Rx sent, reassess in 4 months Anxiety, depression: On Wellbutrin, Zoloft.  Feels stressed out but anxiety and depression per se  are well controlled.  She works long hours, job is very demanding, his wife has epilepsy, etc.  Encouraged to see a Social worker.  He has some decrease in concentration, offered ADD evaluation, declined . Occasional cough: Observation for now reassess on RTC RTC 4 months -Td 02-2022 - shingrix x2 -COVID booster discussed - Flu shot today -CCS: Colonoscopy  08/2008, tubular adenoma, colonoscopy 2015, colonoscopy 04/23/2021, next per GI -Prostate cancer screening: PSA was elevated 08-2019 at 5.4 recheck 01-2020 at urology: 1.5. Had a BX of the prostate 02/21/2020: Benign. Elevated PSA: To see urology next week  -(+)  FH of CAD :asx -Patient education: Diet, exercise, healthcare power of attorney.

## 2022-03-05 ENCOUNTER — Encounter: Payer: Self-pay | Admitting: Internal Medicine

## 2022-03-05 NOTE — Assessment & Plan Note (Signed)
Here for CPX Recent labs reviewed.  All okay except LDL has increased. HTN: On no BP meds, BP is normal. Hyperlipidemia: Self stopped atorvastatin about a year ago, memory issues?  Memory has actually not getting better after he stopped atorvastatin, + FH CAD thus willing to go back on atorvastatin.  Rx sent, reassess in 4 months Anxiety, depression: On Wellbutrin, Zoloft.  Feels stressed out but anxiety and depression per se are well controlled. he works long hours, job is very demanding, his wife has epilepsy, etc.  Encouraged to see a Social worker.  He has some decrease in concentration, offered ADD evaluation, declined . Occasional cough: Observation for now reassess on RTC RTC 4 months

## 2022-03-05 NOTE — Assessment & Plan Note (Signed)
-  Td 02-2022 - shingrix x2 -COVID booster discussed - Flu shot today -CCS: Colonoscopy  08/2008, tubular adenoma, colonoscopy 2015, colonoscopy 04/23/2021, next per GI -Prostate cancer screening:  H/o elevated PSA , s/p  prostate bx 02/21/2020: Benign.  To see urology next week  -(+)  FH of CAD :asx -Patient education: Diet, exercise, healthcare power of attorney.

## 2022-03-05 NOTE — Addendum Note (Signed)
Addended by: [name] E on: 03/05/2022 05:51 PM   Modules accepted: Level of Service

## 2022-03-27 DIAGNOSIS — N401 Enlarged prostate with lower urinary tract symptoms: Secondary | ICD-10-CM | POA: Diagnosis not present

## 2022-03-27 DIAGNOSIS — Z125 Encounter for screening for malignant neoplasm of prostate: Secondary | ICD-10-CM | POA: Diagnosis not present

## 2022-03-27 DIAGNOSIS — R3912 Poor urinary stream: Secondary | ICD-10-CM | POA: Diagnosis not present

## 2022-03-30 DIAGNOSIS — Z6833 Body mass index (BMI) 33.0-33.9, adult: Secondary | ICD-10-CM | POA: Diagnosis not present

## 2022-03-30 DIAGNOSIS — H10022 Other mucopurulent conjunctivitis, left eye: Secondary | ICD-10-CM | POA: Diagnosis not present

## 2022-04-07 ENCOUNTER — Encounter: Payer: Self-pay | Admitting: Family Medicine

## 2022-04-07 ENCOUNTER — Ambulatory Visit (INDEPENDENT_AMBULATORY_CARE_PROVIDER_SITE_OTHER): Payer: BC Managed Care – PPO | Admitting: Family Medicine

## 2022-04-07 VITALS — BP 128/80 | HR 97 | Temp 98.1°F | Ht 69.0 in | Wt 232.1 lb

## 2022-04-07 DIAGNOSIS — J0100 Acute maxillary sinusitis, unspecified: Secondary | ICD-10-CM | POA: Diagnosis not present

## 2022-04-07 MED ORDER — DOXYCYCLINE HYCLATE 100 MG PO TABS: 100.0000 mg | ORAL_TABLET | Freq: Two times a day (BID) | ORAL | 0 refills | Status: AC

## 2022-04-07 MED ORDER — PREDNISONE 20 MG PO TABS: 40.0000 mg | ORAL_TABLET | Freq: Every day | ORAL | 0 refills | Status: AC

## 2022-04-07 NOTE — Patient Instructions (Signed)
Continue to push fluids, practice good hand hygiene, and cover your mouth if you cough. ? ?If you start having fevers, shaking or shortness of breath, seek immediate care. ? ?OK to take Tylenol 1000 mg (2 extra strength tabs) or 975 mg (3 regular strength tabs) every 6 hours as needed. ? ?Let us know if you need anything. ?

## 2022-04-07 NOTE — Progress Notes (Signed)
Chief Complaint  Patient presents with   Cough    Oscar Fuller here for URI complaints.  Duration: 1 week, worsening over the past 3 d Associated symptoms: Fever (101 F), sinus congestion, sinus pain, rhinorrhea, itchy watery eyes, sore throat,  and dental pain Denies: ear pain, ear drainage, wheezing, and shortness of breath Treatment to date: Mucinex, cough meds otc, Vit C Sick contacts: No Tested neg for covid.   Past Medical History:  Diagnosis Date   Anxiety    Atypical chest pain    with ER visit in 8/11. ETT-myoview showed EF 61% no ischemia or infaraction 8/11   Clotting disorder (HCC)    DVT 2015   Depression    DVT (deep venous thrombosis) (HCC)    ED (erectile dysfunction) 07/2008   used some cialis, symptoms not active at present    Hyperlipidemia    Hypertension    was on ACEi-diuretics   Increased prostate specific antigen (PSA) velocity    saw Dr Joelyn Oms, s/p Bx(-) 2002,2022   Prediabetes 09/07/2013   Pulmonary embolism ([place]) 11/2013   PE and L DVT after a trip   SCC (squamous cell carcinoma)    Dr Sherrye Payor, in the back   Sleep apnea    HST 05/2012:  AHI 34/hr with desat as low as 69%.    Tubular adenoma of colon 08/2008    Objective BP 128/80 (BP Location: Left Arm, Patient Position: Sitting, Cuff Size: Normal)   Pulse 97   Temp 98.1 F (36.7 C) (Oral)   Ht '5\' 9"'$  (1.753 m)   Wt 232 lb 2 oz (105.3 kg)   SpO2 97%   BMI 34.28 kg/m  General: Awake, alert, appears stated age HEENT: AT, [place], ears patent b/l and TM's neg, nares patent w/o discharge, pharynx pink and without exudates, MMM, +TTP over R max sinus Neck: No masses or asymmetry Heart: RRR Lungs: CTAB, no accessory muscle use Psych: Age appropriate judgment and insight, normal mood and affect  Acute non-recurrent maxillary sinusitis - Plan: predniSONE (DELTASONE) 20 MG tablet, doxycycline (VIBRA-TABS) 100 MG tablet  Worsening for 3 d, will tx w abx. Having itchy eyes, will send pred burst as  well, 40 mg/d for 5 d. Continue to push fluids, practice good hand hygiene, cover mouth when coughing. F/u prn. If starting to experience worsening s/s's, shaking, or shortness of breath, seek immediate care. Pt voiced understanding and agreement to the plan.  [place], DO 04/07/22 9:30 AM

## 2022-06-02 ENCOUNTER — Encounter (INDEPENDENT_AMBULATORY_CARE_PROVIDER_SITE_OTHER): Payer: Self-pay

## 2022-06-16 ENCOUNTER — Encounter: Payer: Self-pay | Admitting: Internal Medicine

## 2022-06-16 ENCOUNTER — Ambulatory Visit (HOSPITAL_BASED_OUTPATIENT_CLINIC_OR_DEPARTMENT_OTHER)
Admission: RE | Admit: 2022-06-16 | Discharge: 2022-06-16 | Disposition: A | Discharge status: Home / Self Care | Payer: BC Managed Care – PPO | Source: Ambulatory Visit | Attending: Internal Medicine | Admitting: Internal Medicine

## 2022-06-16 ENCOUNTER — Ambulatory Visit (INDEPENDENT_AMBULATORY_CARE_PROVIDER_SITE_OTHER): Payer: BC Managed Care – PPO | Admitting: Internal Medicine

## 2022-06-16 VITALS — BP 132/86 | HR 88 | Temp 98.1°F | Resp 16 | Ht 69.0 in | Wt 233.0 lb

## 2022-06-16 DIAGNOSIS — E785 Hyperlipidemia, unspecified: Secondary | ICD-10-CM

## 2022-06-16 DIAGNOSIS — R490 Dysphonia: Secondary | ICD-10-CM | POA: Diagnosis not present

## 2022-06-16 DIAGNOSIS — R059 Cough, unspecified: Secondary | ICD-10-CM | POA: Diagnosis not present

## 2022-06-16 DIAGNOSIS — R052 Subacute cough: Secondary | ICD-10-CM | POA: Diagnosis not present

## 2022-06-16 DIAGNOSIS — F419 Anxiety disorder, unspecified: Secondary | ICD-10-CM | POA: Diagnosis not present

## 2022-06-16 DIAGNOSIS — F32A Depression, unspecified: Secondary | ICD-10-CM

## 2022-06-16 LAB — AST: AST: 16 U/L (ref 0–37)

## 2022-06-16 LAB — LIPID PANEL
Cholesterol: 124 mg/dL (ref 0–200)
HDL: 40.8 mg/dL (ref >39.00)
LDL Cholesterol: 60 mg/dL (ref 0–99)
NonHDL: 83.3
Total CHOL/HDL Ratio: 3
Triglycerides: 116 mg/dL (ref 0.0–149.0)
VLDL: 23.2 mg/dL (ref 0.0–40.0)

## 2022-06-16 LAB — ALT: ALT: 18 U/L (ref 0–53)

## 2022-06-16 MED ORDER — FLUTICASONE-SALMETEROL 100-50 MCG/ACT IN AEPB: 1.0000 | INHALATION_SPRAY | Freq: Two times a day (BID) | RESPIRATORY_TRACT | 3 refills | Status: DC

## 2022-06-16 MED ORDER — PANTOPRAZOLE SODIUM 40 MG PO TBEC: 40.0000 mg | DELAYED_RELEASE_TABLET | Freq: Every day | ORAL | 3 refills | Status: DC

## 2022-06-16 NOTE — Assessment & Plan Note (Signed)
Cough, hoarseness: After cold, has had persisting cough and hoarseness.  He is not a smoker. On exam, there is some rhonchi.  He reports wheezing. Cough is probably multifactorial including GERD, postinfection cough and possibly bronchospasm. Plan: PPIs, Symbicort x 1 month then as needed.  ENT referral, chest x-ray. Reassess in 4 months Hyperlipidemia: Went back on atorvastatin.  Checking labs Anxiety depression: + stress, work related but managing well with current meds RTC 4 months

## 2022-06-16 NOTE — Progress Notes (Signed)
Subjective:    Patient ID: Oscar Fuller, male    DOB: [birth year], 67 y.o.   MRN: [id number]  DOS:  06/16/2022 Type of visit - description: f/u  Since last visit, restarted atorvastatin.  Due for blood work.  Was seen in November with a cold, RX  doxycycline and prednisone. Since then, cold symptoms resolved but he has the following: Lingering cough, worse at night, mostly dry but associated with some chest congestion. Also hoarseness since then.  Denies fever or chills.  Question of night sweats but no weight loss. Denies GERD symptoms. Question of wheezing per patient.   Review of Systems See above   Past Medical History:  Diagnosis Date   Anxiety    Atypical chest pain    with ER visit in 8/11. ETT-myoview showed EF 61% no ischemia or infaraction 8/11   Clotting disorder (HCC)    DVT 2015   Depression    DVT (deep venous thrombosis) (HCC)    ED (erectile dysfunction) 07/2008   used some cialis, symptoms not active at present    Hyperlipidemia    Hypertension    was on ACEi-diuretics   Increased prostate specific antigen (PSA) velocity    saw Dr Joelyn Oms, s/p Bx(-) 2002,2022   Prediabetes 09/07/2013   Pulmonary embolism ([place]) 11/2013   PE and L DVT after a trip   SCC (squamous cell carcinoma)    Dr Sherrye Payor, in the back   Sleep apnea    HST 05/2012:  AHI 34/hr with desat as low as 69%.    Tubular adenoma of colon 08/2008    Past Surgical History:  Procedure Laterality Date   COLONOSCOPY     KNEE SURGERY  2009   left arthroscopy   POLYPECTOMY     PROSTATE BIOPSY  02/2020   benign, biopsy x 2 2002 and 2021 or 2022 both benign   VASECTOMY      Current Outpatient Medications  Medication Instructions   Aspirin 162 mg, Oral, Daily   atorvastatin (LIPITOR) 40 mg, Oral, Daily at bedtime   buPROPion (WELLBUTRIN) 100 mg, Oral, 2 times daily   cetirizine (ZYRTEC ALLERGY) 10 mg, Oral, Daily   Multiple Vitamin (MULTIVITAMIN) tablet 1 tablet, Oral, Daily, Men's once a  day   sertraline (ZOLOFT) 100 mg, Oral, Daily   tamsulosin (FLOMAX) 0.4 mg, Oral, Daily       Objective:   Physical Exam BP 132/86   Pulse 88   Temp 98.1 F (36.7 C) (Oral)   Resp 16   Ht '5\' 9"'$  (1.753 m)   Wt 233 lb (105.7 kg)   SpO2 98%   BMI 34.41 kg/m  General:   Well developed, NAD, BMI noted. HEENT:  Normocephalic . Face symmetric, atraumatic + Hoarseness noted. Neck: No thyromegaly Lymphatic system: No lymphadenopathies at the neck, supraclavicular areas or axilla  Lungs:  + rhonchi, no wheezing. Normal respiratory effort, no intercostal retractions, no accessory muscle use. Heart: RRR,  no murmur.  Lower extremities: no pretibial edema bilaterally  Skin: Not pale. Not jaundice Neurologic:  alert & oriented X3.  Speech normal, gait appropriate for age and unassisted Psych--  Cognition and judgment appear intact.  Cooperative with normal attention span and concentration.  Behavior appropriate. No anxious or depressed appearing.      Assessment     Assessment Prediabetes HTN Hyperlipidemia  depression, anxiety OSA 2014, Dr Halford Chessman Hematology Dr.Ennever, heterozygous for factor V Leiden --H/o  PE, DVT 11-2013 after a trip  --Thrombophlebitis  02-2015 -- off xarelto, on ASA 162 since 08-2016 FH CAD, CP:  stress test no ischemia 2011 GU: BPH, LUTS, increased PSA velocity BX 2002  PLAN: Cough, hoarseness: After cold, has had persisting cough and hoarseness.  He is not a smoker. On exam, there is some rhonchi.  He reports wheezing. Cough is probably multifactorial including GERD, postinfection cough and possibly bronchospasm. Plan: PPIs, Symbicort x 1 month then as needed.  ENT referral, chest x-ray. Reassess in 4 months Hyperlipidemia: Went back on atorvastatin.  Checking labs Anxiety depression: + stress, work related but managing well with current meds RTC 4 months

## 2022-06-16 NOTE — Patient Instructions (Addendum)
Start pantoprazole 1 tablet before breakfast every morning  Start Symbicort: 2 puffs twice daily.  Rinse your throat after each use.  We are referring you to the ENT doctor.  Go to the first floor, get a chest x-ray.  GO TO THE LAB : Get the blood work     [place], [place] Come back for   checkup in 4 months   Vaccines I recommend:  Covid booster RSV vaccine

## 2022-06-21 ENCOUNTER — Other Ambulatory Visit: Payer: Self-pay | Admitting: Internal Medicine

## 2022-06-23 ENCOUNTER — Other Ambulatory Visit: Payer: Self-pay | Admitting: Internal Medicine

## 2022-06-25 ENCOUNTER — Other Ambulatory Visit: Payer: Self-pay | Admitting: Internal Medicine

## 2022-08-25 ENCOUNTER — Other Ambulatory Visit: Payer: Self-pay | Admitting: Internal Medicine

## 2022-10-03 ENCOUNTER — Telehealth: Payer: Self-pay

## 2022-10-03 ENCOUNTER — Other Ambulatory Visit: Payer: Self-pay | Admitting: Internal Medicine

## 2022-10-03 NOTE — Telephone Encounter (Signed)
PA initiated via Covermymeds; KEY: [id number].  Awaiting determination.

## 2022-10-03 NOTE — Telephone Encounter (Signed)
PA approved.   Approved. Authorization Expiration Date: 10/03/2023

## 2022-10-15 ENCOUNTER — Ambulatory Visit: Payer: BC Managed Care – PPO | Admitting: Internal Medicine

## 2022-12-16 ENCOUNTER — Other Ambulatory Visit: Payer: Self-pay | Admitting: Internal Medicine

## 2023-03-13 ENCOUNTER — Encounter: Payer: BC Managed Care – PPO | Admitting: Internal Medicine

## 2023-03-14 ENCOUNTER — Other Ambulatory Visit: Payer: Self-pay | Admitting: Internal Medicine

## 2023-05-08 ENCOUNTER — Ambulatory Visit (INDEPENDENT_AMBULATORY_CARE_PROVIDER_SITE_OTHER): Payer: Medicare Other | Admitting: Internal Medicine

## 2023-05-08 ENCOUNTER — Encounter: Payer: Self-pay | Admitting: Internal Medicine

## 2023-05-08 ENCOUNTER — Telehealth: Payer: Self-pay | Admitting: Internal Medicine

## 2023-05-08 VITALS — BP 119/78 | HR 97 | Temp 98.0°F | Resp 16 | Ht 69.5 in | Wt 237.0 lb

## 2023-05-08 DIAGNOSIS — Z0000 Encounter for general adult medical examination without abnormal findings: Secondary | ICD-10-CM | POA: Diagnosis not present

## 2023-05-08 DIAGNOSIS — E785 Hyperlipidemia, unspecified: Secondary | ICD-10-CM

## 2023-05-08 DIAGNOSIS — R052 Subacute cough: Secondary | ICD-10-CM

## 2023-05-08 DIAGNOSIS — Z0001 Encounter for general adult medical examination with abnormal findings: Secondary | ICD-10-CM

## 2023-05-08 DIAGNOSIS — R739 Hyperglycemia, unspecified: Secondary | ICD-10-CM

## 2023-05-08 DIAGNOSIS — R0609 Other forms of dyspnea: Secondary | ICD-10-CM | POA: Diagnosis not present

## 2023-05-08 DIAGNOSIS — G4733 Obstructive sleep apnea (adult) (pediatric): Secondary | ICD-10-CM

## 2023-05-08 DIAGNOSIS — Z23 Encounter for immunization: Secondary | ICD-10-CM

## 2023-05-08 LAB — EKG 12-LEAD

## 2023-05-08 MED ORDER — PANTOPRAZOLE SODIUM 40 MG PO TBEC: 40.0000 mg | DELAYED_RELEASE_TABLET | Freq: Every day | ORAL | 4 refills | Status: DC

## 2023-05-08 MED ORDER — FLUTICASONE-SALMETEROL 100-50 MCG/ACT IN AEPB: 1.0000 | INHALATION_SPRAY | Freq: Two times a day (BID) | RESPIRATORY_TRACT | 4 refills | Status: DC

## 2023-05-08 NOTE — Patient Instructions (Addendum)
Recommend a COVID booster.  Cough: Pantoprazole 1 tablet daily before breakfast Advair 1 puff twice daily. Call me in 4 weeks, if you are not better I will send you to a specialist.  Will refer you to cardiology because dizziness and difficulty breathing when you exercise  Please see your urologist.    GO TO THE LAB : Get the blood work     Next visit with me 4 months. Please schedule it at the front desk

## 2023-05-08 NOTE — Progress Notes (Unsigned)
Subjective:    Patient ID: Oscar Fuller, male    DOB: 1955/06/25, 67 y.o.   MRN: [id number]  DOS:  05/08/2023 Type of visit - description: CPX  Here for CPX Chronic medical problems addressed He also has a number of other symptoms:  He was seen with cough earlier this year, he eventually felt better but symptoms have returned on for the last month: Cough seems to be worse when he lays down in bed, no cough when he takes a nap on a recliner. Denies fever or chills. No sputum production or weight loss. No nausea vomiting.  No diarrhea.  No heartburn.  Also, when he gets very active he typically has to stop due to dizziness and DOE. Denies stroke symptoms like slurred speech or motor deficit. No chest pain or palpitations.  No lower extremity edema.  Review of Systems  Other than above, a 14 point review of systems is negative     Past Medical History:  Diagnosis Date   Anxiety    Atypical chest pain    with ER visit in 8/11. ETT-myoview showed EF 61% no ischemia or infaraction 8/11   Clotting disorder (HCC)    DVT 2015   Depression    DVT (deep venous thrombosis) (HCC)    ED (erectile dysfunction) 07/2008   used some cialis, symptoms not active at present    Hyperlipidemia    Hypertension    was on ACEi-diuretics   Increased prostate specific antigen (PSA) velocity    saw Dr Etta Grandchild, s/p Bx(-) 2002,2022   Prediabetes 09/07/2013   Pulmonary embolism (HCC) 11/2013   PE and L DVT after a trip   SCC (squamous cell carcinoma)    Dr Londell Moh, in the back   Sleep apnea    HST 05/2012:  AHI 34/hr with desat as low as 69%.    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[employer], Airline pilot, recruter  Tobacco Use   Smoking status: Never   Smokeless tobacco: Never  Vaping Use   Vaping status: Never Used  Substance and Sexual Activity   Alcohol use: No    Alcohol/week: 0.0 standard drinks of alcohol    Comment:     Drug use: No   Sexual activity: Not on file  Other Topics Concern   Not on file  Social History Narrative   Married x 2  , 2nd wife Zella Ball has a h/o SZ d/o    2 daughters : Charity fundraiser RT      Social Drivers of Health   Financial Resource Strain: Low Risk  (10/08/2022)   Overall Financial Resource Strain (CARDIA)    Difficulty of Paying Living Expenses: Not very hard  Food Insecurity: Patient Declined (10/08/2022)   Hunger Vital Sign    Worried About Running Out of Food in the Last Year: Patient declined    Ran Out of Food in the Last Year:  Patient declined  Transportation Needs: No Transportation Needs (10/08/2022)   PRAPARE - Administrator, Civil Service (Medical): No    Lack of Transportation (Non-Medical): No  Physical Activity: Sufficiently Active (10/08/2022)   Exercise Vital Sign    Days of Exercise per Week: 3 days    Minutes of Exercise per Session: 50 min  Stress: No Stress Concern Present (10/08/2022)   [employer] of Occupational Health - Occupational Stress Questionnaire    Feeling of Stress : Only a little  Social Connections: Socially Integrated (10/08/2022)   Social Connection and Isolation Panel [NHANES]    Frequency of Communication with Friends and Family: Twice a week    Frequency of Social Gatherings with Friends and Family: Once a week    Attends Religious Services: More than 4 times per year    Active Member of [employer] or Organizations: Yes    Attends Engineer, structural: More than 4 times per year    Marital Status: Married   Catering manager Violence: Not on file    Current Outpatient Medications  Medication Instructions   Aspirin 162 mg, Daily   atorvastatin (LIPITOR) 40 mg, Oral, Daily at bedtime   buPROPion (WELLBUTRIN) 100 mg, Oral, 2 times daily   fluticasone-salmeterol (ADVAIR) 100-50 MCG/ACT AEPB 1 puff, Inhalation, 2 times daily   Multiple Vitamin (MULTIVITAMIN) tablet 1 tablet, Daily   pantoprazole (PROTONIX) 40 mg, Oral, Daily   sertraline (ZOLOFT) 100 mg, Oral, Daily   tamsulosin (FLOMAX) 0.4 mg, Daily       Objective:   Physical Exam BP 119/78 (BP Location: Right Arm, Patient Position: Sitting, Cuff Size: Large)   Pulse 97   Temp 98 F (36.7 C) (Oral)   Resp 16   Ht 5' 9.5" (1.765 m)   Wt 237 lb (107.5 kg)   SpO2 97%   BMI 34.50 kg/m  General: Well developed, NAD, BMI noted Neck: No  thyromegaly  HEENT:  Normocephalic . Face symmetric, atraumatic Lungs:  CTA B Normal respiratory effort, no intercostal retractions, no accessory muscle use. Heart: RRR,  no murmur.  Abdomen:  Not distended, soft, non-tender. No rebound or rigidity.   Lower extremities: no pretibial edema bilaterally  Skin: Exposed areas without rash. Not pale. Not jaundice Neurologic:  alert & oriented X3.  Speech normal, gait appropriate for age and unassisted Strength symmetric and appropriate for age.  Psych: Cognition and judgment appear intact.  Cooperative with normal attention span and concentration.  Behavior appropriate. No anxious or depressed appearing.     Assessment    Assessment Prediabetes HTN Hyperlipidemia  depression, anxiety OSA 2014, Dr Craige Cotta Hematology Dr.Ennever, heterozygous for factor V Leiden --H/o  PE, DVT 11-2013 after a trip  --Thrombophlebitis 02-2015 -- off xarelto, on ASA 162 since 08-2016 FH CAD, CP:  stress test no ischemia 2011 GU: BPH, LUTS, increased PSA velocity BX 2002  PLAN: Here for CPX - Td 02-2022 - shingrix x2 - Flu shot today - Rec covid booster   -CCS: Colonoscopy  08/2008, tubular adenoma, colonoscopy 2015, colonoscopy 04/23/2021, next 3 years per GI letter  -Prostate cancer screening:  H/o elevated PSA , s/p  prostate bx 02/21/2020: Benign.  Patient reports he sees urology (had some LUTS, recommend to talk to them about it) -(+)  FH of CAD :asx -Patient education: Diet, exercise -Labs: CMP FLP CBC A1c TSH Cough  Was seen 06-2022, with cough and hoarseness.  Chest x-ray was negative, was recommended ENT but I do not  think that happening. Symptoms have resurfaced in the last 4 weeks, only cough, no hoarseness. Plan: Restart PPIs, Symbicort, call if not better in 4 weeks for pulmonary  referral. Hyperlipidemia: On atorvastatin checking labs Anxiety depression: Symptoms currently okay on Wellbutrin sertraline. DOE dizziness. Exertional symptoms in the context of FH CAD, EKG today:NSR Plan: Refer to cardiology. RTC 4 months

## 2023-05-08 NOTE — Telephone Encounter (Unsigned)
Copied from CRM [id number]. Topic: Referral - Question >> May 08, 2023  3:40 PM Adele Barthel wrote: Reason for CRM: Pt is requesting referral for [facility] so that he can receive a new CPAP machine. CB# [phone]

## 2023-05-09 ENCOUNTER — Encounter: Payer: Self-pay | Admitting: Internal Medicine

## 2023-05-09 LAB — COMPREHENSIVE METABOLIC PANEL
AG Ratio: 1.8 (calc) (ref 1.0–2.5)
ALT: 24 U/L (ref 9–46)
AST: 20 U/L (ref 10–35)
Albumin: 4.5 g/dL (ref 3.6–5.1)
Alkaline phosphatase (APISO): 107 U/L (ref 35–144)
BUN: 14 mg/dL (ref 7–25)
CO2: 26 mmol/L (ref 20–32)
Calcium: 9.6 mg/dL (ref 8.6–10.3)
Chloride: 107 mmol/L (ref 98–110)
Creat: 1.17 mg/dL (ref 0.70–1.35)
Globulin: 2.5 g/dL (ref 1.9–3.7)
Glucose, Bld: 99 mg/dL (ref 65–99)
Potassium: 4.5 mmol/L (ref 3.5–5.3)
Sodium: 142 mmol/L (ref 135–146)
Total Bilirubin: 0.9 mg/dL (ref 0.2–1.2)
Total Protein: 7 g/dL (ref 6.1–8.1)

## 2023-05-09 LAB — CBC WITH DIFFERENTIAL/PLATELET
Absolute Lymphocytes: 2355 {cells}/uL (ref 850–3900)
Absolute Monocytes: 723 {cells}/uL (ref 200–950)
Basophils Absolute: 60 {cells}/uL (ref 0–200)
Basophils Relative: 0.7 %
Eosinophils Absolute: 238 {cells}/uL (ref 15–500)
Eosinophils Relative: 2.8 %
HCT: 49.3 % (ref 38.5–50.0)
Hemoglobin: 16.7 g/dL (ref 13.2–17.1)
MCH: 29.9 pg (ref 27.0–33.0)
MCHC: 33.9 g/dL (ref 32.0–36.0)
MCV: 88.2 fL (ref 80.0–100.0)
MPV: 10.8 fL (ref 7.5–12.5)
Monocytes Relative: 8.5 %
Neutro Abs: 5126 {cells}/uL (ref 1500–7800)
Neutrophils Relative %: 60.3 %
Platelets: 215 10*3/uL (ref 140–400)
RBC: 5.59 10*6/uL (ref 4.20–5.80)
RDW: 12.6 % (ref 11.0–15.0)
Total Lymphocyte: 27.7 %
WBC: 8.5 10*3/uL (ref 3.8–10.8)

## 2023-05-09 LAB — LIPID PANEL
Cholesterol: 168 mg/dL (ref <200)
HDL: 47 mg/dL (ref >=40)
LDL Cholesterol (Calc): 96 mg/dL
Non-HDL Cholesterol (Calc): 121 mg/dL (ref <130)
Total CHOL/HDL Ratio: 3.6 (calc) (ref <5.0)
Triglycerides: 158 mg/dL — ABNORMAL HIGH (ref <150)

## 2023-05-09 LAB — HEMOGLOBIN A1C
Hgb A1c MFr Bld: 5.7 %{Hb} — ABNORMAL HIGH (ref <5.7)
Mean Plasma Glucose: 117 mg/dL
eAG (mmol/L): 6.5 mmol/L

## 2023-05-09 LAB — TSH: TSH: 1.55 m[IU]/L (ref 0.40–4.50)

## 2023-05-09 NOTE — Telephone Encounter (Signed)
Please arrange the referral 

## 2023-05-09 NOTE — Assessment & Plan Note (Signed)
Here for CPX Cough  Was seen 06-2022, with cough and hoarseness.  Chest x-ray was negative, was recommended ENT but I do not think that happening. Symptoms have resurfaced in the last 4 weeks, only cough, no hoarseness. Plan: Restart PPIs, Symbicort, call if not better in 4 weeks for pulmonary  referral. Hyperlipidemia: On atorvastatin checking labs Anxiety depression: Symptoms currently okay on Wellbutrin sertraline. DOE dizziness. Exertional symptoms in the context of FH CAD, EKG today:NSR Plan: Refer to cardiology. RTC 4 months

## 2023-05-09 NOTE — Assessment & Plan Note (Signed)
Here for CPX - Td 02-2022 - shingrix x2 - Flu shot today - Rec covid booster  -CCS: Colonoscopy  08/2008, tubular adenoma, colonoscopy 2015, colonoscopy 04/23/2021, next 3 years per GI letter  -Prostate cancer screening:  H/o elevated PSA , s/p  prostate bx 02/21/2020: Benign.  Patient reports he sees urology (had some LUTS, recommend to talk to them about it) -(+)  FH of CAD :asx -Patient education: Diet, exercise -Labs: CMP FLP CBC A1c TSH

## 2023-05-11 NOTE — Telephone Encounter (Signed)
Referral placed.

## 2023-05-20 DIAGNOSIS — F419 Anxiety disorder, unspecified: Secondary | ICD-10-CM | POA: Insufficient documentation

## 2023-05-20 DIAGNOSIS — E785 Hyperlipidemia, unspecified: Secondary | ICD-10-CM | POA: Insufficient documentation

## 2023-05-20 DIAGNOSIS — F32A Depression, unspecified: Secondary | ICD-10-CM | POA: Insufficient documentation

## 2023-05-20 DIAGNOSIS — D689 Coagulation defect, unspecified: Secondary | ICD-10-CM | POA: Insufficient documentation

## 2023-05-20 DIAGNOSIS — R972 Elevated prostate specific antigen [PSA]: Secondary | ICD-10-CM | POA: Insufficient documentation

## 2023-05-20 DIAGNOSIS — R0789 Other chest pain: Secondary | ICD-10-CM | POA: Insufficient documentation

## 2023-05-20 DIAGNOSIS — I10 Essential (primary) hypertension: Secondary | ICD-10-CM | POA: Insufficient documentation

## 2023-05-20 DIAGNOSIS — C4492 Squamous cell carcinoma of skin, unspecified: Secondary | ICD-10-CM | POA: Insufficient documentation

## 2023-05-21 ENCOUNTER — Ambulatory Visit: Payer: Medicare Other | Admitting: Cardiology

## 2023-06-18 ENCOUNTER — Ambulatory Visit: Payer: Medicare Other

## 2023-06-18 VITALS — BP 150/92 | HR 76 | Ht 69.6 in | Wt 242.1 lb

## 2023-06-18 DIAGNOSIS — R0609 Other forms of dyspnea: Secondary | ICD-10-CM | POA: Insufficient documentation

## 2023-06-18 DIAGNOSIS — R030 Elevated blood-pressure reading, without diagnosis of hypertension: Secondary | ICD-10-CM | POA: Diagnosis not present

## 2023-06-18 DIAGNOSIS — R42 Dizziness and giddiness: Secondary | ICD-10-CM

## 2023-06-18 DIAGNOSIS — G4733 Obstructive sleep apnea (adult) (pediatric): Secondary | ICD-10-CM | POA: Diagnosis not present

## 2023-06-18 HISTORY — DX: Other forms of dyspnea: R06.09

## 2023-06-18 HISTORY — DX: Dizziness and giddiness: R42

## 2023-06-18 MED ORDER — METOPROLOL TARTRATE 100 MG PO TABS: 100.0000 mg | ORAL_TABLET | Freq: Once | ORAL | 0 refills | Status: DC

## 2023-06-18 NOTE — Assessment & Plan Note (Signed)
 Advised to revisit further evaluation for sleep apnea and resume CPAP use if indicated.  Potential harmful effects on cardiovascular system from untreated sleep apnea discussed.

## 2023-06-18 NOTE — Assessment & Plan Note (Signed)
 Possibly angina. Other differential diagnosis includes deconditioning in the setting of obesity recent weight gain and no regular rate size. Diastolic dysfunction could be contributing Less likely to be pulmonary causes.  Unlikely to be any significant venous thromboembolism issues.  Continue with aspirin , takes 162 mg daily. Continue with atorvastatin  40 mg once daily. Currently does not have any significant symptoms at rest.  Advised to avoid moderate to heavy exertion.  Will further evaluate with transthoracic echocardiogram to assess cardiac structure and function. Will further evaluate for any significant coronary artery disease with CT coronary angiogram.  No contrast dye allergy.  Okay for metoprolol  tartrate 100 mg on the morning of the study for heart rate optimization.

## 2023-06-18 NOTE — Assessment & Plan Note (Signed)
 Advised him to keep a log of blood pressure readings at home. Advised him to notify us  or his PCP in the next 1 to 2 weeks if blood pressure readings consistently above 130 over 80 mmHg.  Would be a candidate for ACE inhibitor/ARB/calcium  channel blocker or beta-blockers for blood pressure control if required.

## 2023-06-18 NOTE — Assessment & Plan Note (Signed)
 Most of the symptoms as dizziness occurred with exertion. No symptoms at rest. No syncope or near syncope.  Will obtain a 14-day event monitor to rule out any significant underlying arrhythmias with activity.

## 2023-06-18 NOTE — Progress Notes (Signed)
 Cardiology Consultation:    Date:  06/18/2023   ID:  Oscar Fuller, DOB Mar 05, 1956, MRN [id number]  PCP:  Oscar Aloysius BRAVO, MD  Cardiologist:  Oscar SAUNDERS Jaunita Mikels, MD   Referring MD: Oscar Aloysius BRAVO, MD   No chief complaint on file.    ASSESSMENT AND PLAN:   Oscar Fuller 68 year old male with history of factor V Leyden deficiency heterozygosity, left lower extremity DVT and bilateral pulmonary embolism [July - August 2015 in the setting of hip fracture, on anticoagulation with Xarelto  until April 2018], hypertension, hyperlipidemia, prediabetes, obstructive sleep apnea, BPH, normal exercise is stress test with nuclear imaging in 2011, referred for further evaluation for dyspnea on exertion and dizziness.  Problem List Items Addressed This Visit     ELEVATED BLOOD PRESSURE WITHOUT DIAGNOSIS OF HYPERTENSION   Advised him to keep a log of blood pressure readings at home. Advised him to notify us  or his PCP in the next 1 to 2 weeks if blood pressure readings consistently above 130 over 80 mmHg.  Would be a candidate for ACE inhibitor/ARB/calcium  channel blocker or beta-blockers for blood pressure control if required.      OSA (obstructive sleep apnea)   Advised to revisit further evaluation for sleep apnea and resume CPAP use if indicated.  Potential harmful effects on cardiovascular system from untreated sleep apnea discussed.      Dyspnea on exertion - Primary   Possibly angina. Other differential diagnosis includes deconditioning in the setting of obesity recent weight gain and no regular rate size. Diastolic dysfunction could be contributing Less likely to be pulmonary causes.  Unlikely to be any significant venous thromboembolism issues.  Continue with aspirin , takes 162 mg daily. Continue with atorvastatin  40 mg once daily. Currently does not have any significant symptoms at rest.  Advised to avoid moderate to heavy exertion.  Will further evaluate with transthoracic echocardiogram to  assess cardiac structure and function. Will further evaluate for any significant coronary artery disease with CT coronary angiogram.  No contrast dye allergy.  Okay for metoprolol  tartrate 100 mg on the morning of the study for heart rate optimization.       Relevant Orders   EKG 12-Lead (Completed)   Dizziness   Most of the symptoms as dizziness occurred with exertion. No symptoms at rest. No syncope or near syncope.  Will obtain a 14-day event monitor to rule out any significant underlying arrhythmias with activity.           History of Present Illness:    Oscar Fuller is a 68 y.o. male who is being seen today for the evaluation of dyspnea on exertion and dizziness at the request of Oscar Aloysius BRAVO, MD.  Remote history of follow-up in 2011 with cardiologist for atypical chest pain and workup with exercise treadmill stress test with nuclear imaging in August 2011 was normal.  Has history of hypertension, hyperlipidemia, prediabetes, obstructive sleep apnea-not using CPAP for past 5 years, heterozygous factor V Leiden deficiency, in the setting of hip fracture related decreased mobility ended up with multiple diffuse bilateral PE [CTA chest 12/09/2013], left lower extremity DVT of posterior tibial vein [August 2015] -was on Xarelto  until April 2018, BPH, normal stress test with nuclear imaging in 2011.  Pleasant gentleman here for the visit accompanied by his wife.  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  Mentions over the last many months he has been noticing symptoms of shortness of breath  with exertion where he has to take breaks.  This is a change over the last year.  Also endorses getting dizzy on exertion.  Denies any syncopal episodes or falls.  Denies any significant symptoms at rest.  Denies any orthopnea, paroxysmal nocturnal dyspnea. Does not monitor his blood pressures at home. Mentions has gained up to 20 pounds in the last  year.  Denies any blood in urine or stools.  Also reports cough which has been relatively chronic and worse with laying down.  PCP prescribed pantoprazole  which she has not been using consistently.  No smoking. Wife does note he snores significantly with which she describes apneic spells.  He has not been using CPAP in over 5 years.  EKG in the clinic today shows sinus rhythm heart rate 76/min, normal PR interval 156 ms, QRS morphology consistent with left ventricular hypertrophy, nonpathological appearing Q waves in lead III.  Mother died from cardiac arrest 64s. Father has history of heart disease.  Recent blood work from 05/08/2023 reviewed. TSH normal 1.55 CMP with BUN 14, creatinine 1.17 Normal transaminases and alkaline phosphatase Hemoglobin A1c 5.7 consistent with prediabetes history. Lipid panel with total cholesterol 168, HDL 47, LDL 96, triglycerides 158, optimal in the setting of prediabetes and no known coronary artery disease history. CBC with normal hemoglobin and hematocrit 16.7/49.3.  Normal WBC and platelet counts.  Past Medical History:  Diagnosis Date   Annual physical exam 10/31/2010   Anxiety    Anxiety and depression 10/16/2009   Qualifier: Diagnosis of   By: Amon MD, Aloysius Fuller.        Atypical chest pain    with ER visit in 8/11. ETT-myoview showed EF 61% no ischemia or infaraction 8/11   Bilateral pulmonary embolism (HCC) 12/09/2013   BPH (benign prostatic hyperplasia) 08/10/2006   Annotation: BX NORMAL.  Qualifier: Diagnosis of   By: Oscar Fuller         Chronic anticoagulation 03/28/2014   Clotting disorder (HCC)    DVT 2015   Depression    DVT (deep venous thrombosis) (HCC)    Dyslipidemia 08/10/2006   Qualifier: Diagnosis of   By: Oscar Fuller         ED (erectile dysfunction) 07/2008   used some cialis, symptoms not active at present    ELEVATED BLOOD PRESSURE WITHOUT DIAGNOSIS OF HYPERTENSION 07/28/2008   amb BPS ~ 130-140/80-90         History of colon polyps 03/28/2014   Hyperglycemia 09/07/2013   Hyperlipidemia    Hypertension    was on ACEi-diuretics   Increased prostate specific antigen (PSA) velocity    saw Dr Kathlynn, s/p Bx(-) 2002,2022   Insomnia 02/14/2014   OBESITY 07/28/2008   Qualifier: Diagnosis of   By: Amon MD, Jose E.        OSA (obstructive sleep apnea) 06/08/2012   HST 05/2012:  AHI 34/hr with desat as low as 69%.-- on CPAP  Auto setting 5-20cm     Prediabetes 09/07/2013   Pulmonary embolism (HCC) 11/2013   PE and L DVT after a trip   SCC (squamous cell carcinoma)    Dr Dwane, in the back   Tubular adenoma of colon 08/2008    Past Surgical History:  Procedure Laterality Date   COLONOSCOPY     KNEE SURGERY  2009   left arthroscopy   POLYPECTOMY     PROSTATE BIOPSY  02/2020   benign, biopsy x 2 2002 and 2021 or 2022 both  benign   VASECTOMY      Current Medications: Current Meds  Medication Sig   Aspirin  162.5 MG CP24 Take 162 mg by mouth daily.   atorvastatin  (LIPITOR) 40 MG tablet Take 1 tablet (40 mg total) by mouth at bedtime.   buPROPion  (WELLBUTRIN ) 100 MG tablet Take 1 tablet (100 mg total) by mouth 2 (two) times daily.   fluticasone -salmeterol (ADVAIR) 100-50 MCG/ACT AEPB Inhale 1 puff into the lungs 2 (two) times daily.   Multiple Vitamin (MULTIVITAMIN) tablet Take 1 tablet by mouth daily. Men's once a day   pantoprazole  (PROTONIX ) 40 MG tablet Take 1 tablet (40 mg total) by mouth daily.   sertraline  (ZOLOFT ) 100 MG tablet Take 1 tablet (100 mg total) by mouth daily.   tamsulosin  (FLOMAX ) 0.4 MG CAPS capsule Take 0.4 mg by mouth daily.     Allergies:   Patient has no known allergies.   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[employer], airline pilot, recruter  Tobacco  Use   Smoking status: Never   Smokeless tobacco: Never  Vaping Use   Vaping status: Never Used  Substance and Sexual Activity   Alcohol use: No    Alcohol/week: 0.0 standard drinks of alcohol    Comment:     Drug use: No   Sexual activity: Not on file  Other Topics Concern   Not on file  Social History Narrative   Married x 2  , 2nd wife Grayce has a h/o SZ d/o    2 daughters : CHARITY FUNDRAISER RT      Social Drivers of Health   Financial Resource Strain: Low Risk  (10/08/2022)   Overall Financial Resource Strain (CARDIA)    Difficulty of Paying Living Expenses: Not very hard  Food Insecurity: Patient Declined (10/08/2022)   Hunger Vital Sign    Worried About Running Out of Food in the Last Year: Patient declined    Ran Out of Food in the Last Year: Patient declined  Transportation Needs: No Transportation Needs (10/08/2022)   PRAPARE - Administrator, Civil Service (Medical): No    Lack of Transportation (Non-Medical): No  Physical Activity: Sufficiently Active (10/08/2022)   Exercise Vital Sign    Days of Exercise per Week: 3 days    Minutes of Exercise per Session: 50 min  Stress: No Stress Concern Present (10/08/2022)   [employer] of Occupational Health - Occupational Stress Questionnaire    Feeling of Stress : Only a little  Social Connections: Socially Integrated (10/08/2022)   Social Connection and Isolation Panel [NHANES]    Frequency of Communication with Friends and Family: Twice a week    Frequency of Social Gatherings with Friends and Family: Once a week    Attends Religious Services: More than 4 times per year    Active Member of [employer] or Organizations: Yes    Attends Engineer, Structural: More than 4 times per year    Marital Status: Married     Family History: The patient's family history includes Coronary artery disease in his father; Glaucoma in his mother; Gout in his brother; Heart attack in his brother; Heart disease in his mother;  Hypertension in his father; Lung cancer in his father; Migraines in his sister; Stomach cancer in his  paternal grandfather; Stroke in an other family member. There is no history of Colon cancer, Prostate cancer, Colon polyps, Esophageal cancer, or Rectal cancer. ROS:   Please see the history of present illness.    All 14 point review of systems negative except as described per history of present illness.  EKGs/Labs/Other Studies Reviewed:    The following studies were reviewed today:   EKG:  EKG Interpretation Date/Time:  Thursday June 18 2023 13:11:39 EST Ventricular Rate:  76 PR Interval:  156 QRS Duration:  88 QT Interval:  382 QTC Calculation: 429 R Axis:   17  Text Interpretation: Normal sinus rhythm Minimal voltage criteria for LVH, may be normal variant ( R in aVL ) Cannot rule out Anterior infarct , age undetermined When compared with ECG of 08-Apr-2014 13:18, Borderline criteria for Inferior infarct are now Present Confirmed by Liborio Hai reddy [phone]) on 06/18/2023 1:24:25 PM    Recent Labs: 05/08/2023: ALT 24; BUN 14; Creat 1.17; Hemoglobin 16.7; Platelets 215; Potassium 4.5; Sodium 142; TSH 1.55  Recent Lipid Panel    Component Value Date/Time   CHOL 168 05/08/2023 1507   TRIG 158 (H) 05/08/2023 1507   HDL 47 05/08/2023 1507   CHOLHDL 3.6 05/08/2023 1507   VLDL 23.2 06/16/2022 1337   LDLCALC 96 05/08/2023 1507   LDLDIRECT 173.4 03/09/2013 1037    Physical Exam:    VS:  BP (!) 150/92   Pulse 76   Ht 5' 9.6 (1.768 m)   Wt 242 lb 1.3 oz (109.8 kg)   SpO2 96%   BMI 35.14 kg/m     Wt Readings from Last 3 Encounters:  06/18/23 242 lb 1.3 oz (109.8 kg)  05/08/23 237 lb (107.5 kg)  06/16/22 233 lb (105.7 kg)     GENERAL:  Well nourished, well developed in no acute distress NECK: No JVD; No carotid bruits CARDIAC: RRR, S1 and S2 present, no murmurs, no rubs, no gallops CHEST:  Clear to auscultation without rales, wheezing or rhonchi  Extremities:  No pitting pedal edema. Pulses bilaterally symmetric with radial 2+ and dorsalis pedis 2+ NEUROLOGIC:  Alert and oriented x 3  Medication Adjustments/Labs and Tests Ordered: Current medicines are reviewed at length with the patient today.  Concerns regarding medicines are outlined above.  Orders Placed This Encounter  Procedures   EKG 12-Lead   No orders of the defined types were placed in this encounter.   Signed, Hai jess Liborio, MD, MPH, [facility]. 06/18/2023 1:52 PM    [facility]

## 2023-06-18 NOTE — Patient Instructions (Signed)
 Medication Instructions:  Your physician recommends that you continue on your current medications as directed. Please refer to the Current Medication list given to you today.   *If you need a refill on your cardiac medications before your next appointment, please call your pharmacy*   Lab Work: None ordered   If you have labs (blood work) drawn today and your tests are completely normal, you will receive your results only by: MyChart Message (if you have MyChart) OR A paper copy in the mail If you have any lab test that is abnormal or we need to change your treatment, we will call you to review the results.    Testing/Procedures:   Your cardiac CT will be scheduled at the following location:   [facility] [address], [address] [place],  [place]  [address]  Main: [phone]  Hold all erectile dysfunction medications at least 3 days (72 hrs) prior to test. (Ie viagra, cialis, sildenafil, tadalafil, etc) We will administer nitroglycerin  during this exam.   On the Night Before the Test: Be sure to Drink plenty of water. Do not consume any caffeinated/decaffeinated beverages or chocolate 12 hours prior to your test. Do not take any antihistamines 12 hours prior to your test.   On the Day of the Test: Drink plenty of water until 1 hour prior to the test. Do not eat any food 1 hour prior to test. You may take your regular medications prior to the test.  Take metoprolol  (Lopressor ) two hours prior to test. This is a one time dose.      After the Test: Drink plenty of water. After receiving IV contrast, you may experience a mild flushed feeling. This is normal. On occasion, you may experience a mild rash up to 24 hours after the test. This is not dangerous. If this occurs, you can take Benadryl 25 mg and increase your fluid intake. If you experience trouble breathing, this can be serious. If it is severe call 911 IMMEDIATELY. If  it is mild, please call our office. If you take any of these medications: Glipizide/Metformin, Avandament, Glucavance, please do not take 48 hours after completing test unless otherwise instructed.  We will call to schedule your test 2-4 weeks out understanding that some insurance companies will need an authorization prior to the service being performed.   For non-scheduling related questions, please contact the cardiac imaging nurse navigator should you have any questions/concerns: Camie Shutter, Cardiac Imaging Nurse Navigator Chantal Requena, Cardiac Imaging Nurse Navigator [facility] Direct Office Dial: [phone]   For scheduling needs, including cancellations and rescheduling, please call Brittany, [phone].   Your physician has requested that you have an echocardiogram. Echocardiography is a painless test that uses sound waves to create images of your heart. It provides your doctor with information about the size and shape of your heart and how well your heart's chambers and valves are working. This procedure takes approximately one hour. There are no restrictions for this procedure. Please do NOT wear cologne, perfume, aftershave, or lotions (deodorant is allowed). Please arrive 15 minutes prior to your appointment time.   Please note: We ask at that you not bring children with you during ultrasound (echo/ vascular) testing. Due to room size and safety concerns, children are not allowed in the ultrasound rooms during exams. Our front office staff cannot provide observation of children in our lobby area while testing is being conducted. An adult  accompanying a patient to their appointment will only be allowed in the ultrasound room at the discretion of the ultrasound technician under special circumstances. We apologize for any inconvenience.  A zio monitor was ordered today. It will remain on for 14 days. Remove 07/02/23. You will then return monitor and  event diary in provided box. It takes 1-2 weeks for report to be downloaded and returned to us . We will call you with the results. If monitor falls off or has orange flashing light, please call Zio for further instructions.   Your next appointment:   6-8 week(s)  The format for your next appointment:   In Person  Provider:   Alean Kobus, MD   Other Instructions Cardiac CT Angiogram A cardiac CT angiogram is a procedure to look at the heart and the area around the heart. It may be done to help find the cause of chest pains or other symptoms of heart disease. During this procedure, a substance called contrast dye is injected into the blood vessels in the area to be checked. A large X-ray machine, called a CT scanner, then takes detailed pictures of the heart and the surrounding area. The procedure is also sometimes called a coronary CT angiogram, coronary artery scanning, or CTA. A cardiac CT angiogram allows the health care provider to see how well blood is flowing to and from the heart. The health care provider will be able to see if there are any problems, such as: Blockage or narrowing of the coronary arteries in the heart. Fluid around the heart. Signs of weakness or disease in the muscles, valves, and tissues of the heart. Tell a health care provider about: Any allergies you have. This is especially important if you have had a previous allergic reaction to contrast dye. All medicines you are taking, including vitamins, herbs, eye drops, creams, and over-the-counter medicines. Any blood disorders you have. Any surgeries you have had. Any medical conditions you have. Whether you are pregnant or may be pregnant. Any anxiety disorders, chronic pain, or other conditions you have that may increase your stress or prevent you from lying still. What are the risks? Generally, this is a safe procedure. However, problems may occur, including: Bleeding. Infection. Allergic reactions to  medicines or dyes. Damage to other structures or organs. Kidney damage from the contrast dye that is used. Increased risk of cancer from radiation exposure. This risk is low. Talk with your health care provider about: The risks and benefits of testing. How you can receive the lowest dose of radiation. What happens before the procedure? Wear comfortable clothing and remove any jewelry, glasses, dentures, and hearing aids. Follow instructions from your health care provider about eating and drinking. This may include: For 12 hours before the procedure -- avoid caffeine. This includes tea, coffee, soda, energy drinks, and diet pills. Drink plenty of water or other fluids that do not have caffeine in them. Being well hydrated can prevent complications. For 4-6 hours before the procedure -- stop eating and drinking. The contrast dye can cause nausea, but this is less likely if your stomach is empty. Ask your health care provider about changing or stopping your regular medicines. This is especially important if you are taking diabetes medicines, blood thinners, or medicines to treat problems with erections (erectile dysfunction). What happens during the procedure?  Hair on your chest may need to be removed so that small sticky patches called electrodes can be placed on your chest. These will transmit information that helps  to monitor your heart during the procedure. An IV will be inserted into one of your veins. You might be given a medicine to control your heart rate during the procedure. This will help to ensure that good images are obtained. You will be asked to lie on an exam table. This table will slide in and out of the CT machine during the procedure. Contrast dye will be injected into the IV. You might feel warm, or you may get a metallic taste in your mouth. You will be given a medicine called nitroglycerin . This will relax or dilate the arteries in your heart. The table that you are lying on  will move into the CT machine tunnel for the scan. The person running the machine will give you instructions while the scans are being done. You may be asked to: Keep your arms above your head. Hold your breath. Stay very still, even if the table is moving. When the scanning is complete, you will be moved out of the machine. The IV will be removed. The procedure may vary among health care providers and hospitals. What can I expect after the procedure? After your procedure, it is common to have: A metallic taste in your mouth from the contrast dye. A feeling of warmth. A headache from the nitroglycerin . Follow these instructions at home: Take over-the-counter and prescription medicines only as told by your health care provider. If you are told, drink enough fluid to keep your urine pale yellow. This will help to flush the contrast dye out of your body. Most people can return to their normal activities right after the procedure. Ask your health care provider what activities are safe for you. It is up to you to get the results of your procedure. Ask your health care provider, or the department that is doing the procedure, when your results will be ready. Keep all follow-up visits as told by your health care provider. This is important. Contact a health care provider if: You have any symptoms of allergy to the contrast dye. These include: Shortness of breath. Rash or hives. A racing heartbeat. Summary A cardiac CT angiogram is a procedure to look at the heart and the area around the heart. It may be done to help find the cause of chest pains or other symptoms of heart disease. During this procedure, a large X-ray machine, called a CT scanner, takes detailed pictures of the heart and the surrounding area after a contrast dye has been injected into blood vessels in the area. Ask your health care provider about changing or stopping your regular medicines before the procedure. This is especially  important if you are taking diabetes medicines, blood thinners, or medicines to treat erectile dysfunction. If you are told, drink enough fluid to keep your urine pale yellow. This will help to flush the contrast dye out of your body. This information is not intended to replace advice given to you by your health care provider. Make sure you discuss any questions you have with your health care provider. Document Revised: 12/22/2018 Document Reviewed: 12/22/2018 Elsevier Patient Education  [employer].  Echocardiogram An echocardiogram is a test that uses sound waves to make images of your heart. This way of making images is often called ultrasound. The images from this test can help find out many things about your heart, including: The size and shape of your heart. The strength of your heart muscle and how well it's working. The size, thickness, and movement of your heart's  walls. How your heart valves are working. Problems such as: A tumor or a growth from an infection around the heart valves. Areas of heart muscle that aren't working well because of poor blood flow or injury from a heart attack. An aneurysm. This is a weak or damaged part of an artery wall. An artery is a blood vessel. Tell a health care provider about: Any allergies you have. All medicines you're taking, including vitamins, herbs, eye drops, creams, and over-the-counter medicines. Any bleeding problems you have. Any surgeries you've had. Any medical problems you have. Whether you're pregnant or may be pregnant. What are the risks? Your health care provider will talk with you about risks. These may include an allergic reaction to IV dye that may be used during the test. What happens before the test? You don't need to do anything to get ready for this test. You may eat and drink normally. What happens during the test?  You'll take off your clothes from the waist up and put on a hospital gown. Sticky patches  called electrodes may be placed on your chest. These will be connected to a machine that monitors your heart rate and rhythm. You'll lie down on a table for the exam. A wand covered in gel will be moved over your chest. Sound waves from the wand will go to your heart and bounce back--or echo back. The sound waves will go to a computer that uses them to make images of your heart. The images can be viewed on a monitor. The images will also be recorded on the computer so your provider can look at them later. You may be asked to change positions or hold your breath for a short time. This makes it easier to get different views or better views of your heart. In some cases, you may be given a dye through an IV. The IV is put into one of your veins. This dye can make the areas of your heart easier to see. The procedure may vary among providers and hospitals. What can I expect after the test? You may return to your normal diet, activities, and medicines unless your provider tells you not to. If an IV was placed for the test, it will be removed. It's up to you to get the results of your test. Ask your provider, or the department that's doing the test, when your results will be ready. This information is not intended to replace advice given to you by your health care provider. Make sure you discuss any questions you have with your health care provider. Document Revised: 06/27/2022 Document Reviewed: 06/27/2022 Elsevier Patient Education  2024 [employer].

## 2023-06-19 ENCOUNTER — Encounter: Payer: Self-pay | Admitting: Internal Medicine

## 2023-06-19 DIAGNOSIS — G4733 Obstructive sleep apnea (adult) (pediatric): Secondary | ICD-10-CM

## 2023-07-03 ENCOUNTER — Encounter (HOSPITAL_COMMUNITY): Payer: Self-pay

## 2023-07-05 ENCOUNTER — Other Ambulatory Visit: Payer: Self-pay | Admitting: Internal Medicine

## 2023-07-07 ENCOUNTER — Ambulatory Visit (HOSPITAL_COMMUNITY)
Admission: RE | Admit: 2023-07-07 | Discharge: 2023-07-07 | Disposition: A | Discharge status: Home / Self Care | Payer: Medicare Other | Source: Ambulatory Visit

## 2023-07-07 DIAGNOSIS — I2510 Atherosclerotic heart disease of native coronary artery without angina pectoris: Secondary | ICD-10-CM | POA: Diagnosis not present

## 2023-07-07 DIAGNOSIS — R0609 Other forms of dyspnea: Secondary | ICD-10-CM | POA: Insufficient documentation

## 2023-07-07 DIAGNOSIS — R42 Dizziness and giddiness: Secondary | ICD-10-CM | POA: Diagnosis not present

## 2023-07-07 HISTORY — DX: Atherosclerotic heart disease of native coronary artery without angina pectoris: I25.10

## 2023-07-07 MED ORDER — NITROGLYCERIN 0.4 MG SL SUBL
SUBLINGUAL_TABLET | SUBLINGUAL | Status: AC
  Filled 2023-07-07: qty 2

## 2023-07-07 MED ORDER — IOHEXOL 350 MG/ML SOLN
95.0000 mL | Freq: Once | INTRAVENOUS | Status: AC | PRN
  Administered 2023-07-07: 95 mL via INTRAVENOUS

## 2023-07-07 MED ORDER — NITROGLYCERIN 0.4 MG SL SUBL
0.8000 mg | SUBLINGUAL_TABLET | Freq: Once | SUBLINGUAL | Status: AC
  Administered 2023-07-07: 0.8 mg via SUBLINGUAL

## 2023-07-09 ENCOUNTER — Ambulatory Visit (HOSPITAL_BASED_OUTPATIENT_CLINIC_OR_DEPARTMENT_OTHER)
Admission: RE | Admit: 2023-07-09 | Discharge: 2023-07-09 | Disposition: A | Discharge status: Home / Self Care | Payer: Medicare Other | Source: Ambulatory Visit

## 2023-07-09 DIAGNOSIS — R0609 Other forms of dyspnea: Secondary | ICD-10-CM | POA: Insufficient documentation

## 2023-07-09 LAB — ECHOCARDIOGRAM COMPLETE
AR max vel: 3.06 cm2
AV Area VTI: 3.09 cm2
AV Area mean vel: 2.9 cm2
AV Mean grad: 4 mm[Hg]
AV Peak grad: 6.6 mm[Hg]
Ao pk vel: 1.28 m/s
Area-P 1/2: 3.08 cm2
Calc EF: 61.1 %
S' Lateral: 2.7 cm
Single Plane A2C EF: 62.2 %
Single Plane A4C EF: 60.6 %

## 2023-07-11 NOTE — Progress Notes (Signed)
 Sent a note to him on MyChart reviewing echocardiogram results.  Normal biventricular function.  Grade 2 diastolic dysfunction reported but review of images show normal left atrial size, septal E prime velocity over 6 cm/s and lateral E prime velocity 9 cm/s, with E/A greater than 1.  No strain imaging.  In my review this appears to suggest normal diastolic function.

## 2023-07-26 DIAGNOSIS — R42 Dizziness and giddiness: Secondary | ICD-10-CM

## 2023-08-05 ENCOUNTER — Ambulatory Visit: Payer: Medicare Other

## 2023-08-05 VITALS — BP 112/82 | HR 104 | Ht 70.0 in | Wt 241.4 lb

## 2023-08-05 DIAGNOSIS — I2510 Atherosclerotic heart disease of native coronary artery without angina pectoris: Secondary | ICD-10-CM | POA: Diagnosis not present

## 2023-08-05 DIAGNOSIS — I10 Essential (primary) hypertension: Secondary | ICD-10-CM | POA: Diagnosis not present

## 2023-08-05 DIAGNOSIS — E782 Mixed hyperlipidemia: Secondary | ICD-10-CM | POA: Diagnosis not present

## 2023-08-05 DIAGNOSIS — R0609 Other forms of dyspnea: Secondary | ICD-10-CM

## 2023-08-05 MED ORDER — METOPROLOL TARTRATE 25 MG PO TABS: 12.5000 mg | ORAL_TABLET | Freq: Two times a day (BID) | ORAL | 2 refills | Status: DC

## 2023-08-05 MED ORDER — FUROSEMIDE 20 MG PO TABS: 20.0000 mg | ORAL_TABLET | ORAL | 0 refills | Status: DC | PRN

## 2023-08-05 NOTE — Patient Instructions (Addendum)
 Medication Instructions:   Start Metoprolol Tartrate 12.5 mg two times a day.   Lasix 20 mg  once a day for 5 days a week.    *If you need a refill on your cardiac medications before your next appointment, please call your pharmacy*   Lab Work: None Ordered If you have labs (blood work) drawn today and your tests are completely normal, you will receive your results only by: MyChart Message (if you have MyChart) OR A paper copy in the mail If you have any lab test that is abnormal or we need to change your treatment, we will call you to review the results.   Testing/Procedures: None Ordered   Follow-Up: At [facility], you and your health needs are our priority.  As part of our continuing mission to provide you with exceptional heart care, we have created designated Provider Care Teams.  These Care Teams include your primary Cardiologist (physician) and Advanced Practice Providers (APPs -  Physician Assistants and Nurse Practitioners) who all work together to provide you with the care you need, when you need it.  We recommend signing up for the patient portal called "MyChart".  Sign up information is provided on this After Visit Summary.  MyChart is used to connect with patients for Virtual Visits (Telemedicine).  Patients are able to view lab/test results, encounter notes, upcoming appointments, etc.  Non-urgent messages can be sent to your provider as well.   To learn more about what you can do with MyChart, go to [email or web address].    Your next appointment:    1 year follow up

## 2023-08-05 NOTE — Assessment & Plan Note (Signed)
 Well-controlled currently without any antihypertensive medications.  Continue to monitor. Starting metoprolol tartrate 12.5 mg twice daily as below for dyspnea on exertion and short runs of SVT

## 2023-08-05 NOTE — Assessment & Plan Note (Addendum)
 No significant obstructive coronary artery disease.  Unlikely to be angina. Diastolic dysfunction reported to be grade 2 on echocardiogram.  However review of images not very convincing. Pulmonary causes could still be contributory.  Recommended low-sodium diet to cut down salt below 2 g/day. Will empirically start low-dose furosemide 20 mg once daily for 5 days a week.  If this helps with his symptoms, we will obtain blood work tentatively in 30 days to monitor electrolytes and kidney function and continue with furosemide. Advised if he starts getting lightheaded or hypotensive with furosemide dose to discontinue furosemide and let us know.  The other contributory factor for his dyspnea on exertion could be elevated heart rates with activity.  His average heart rate is 83/min and does have runs of SVT.  He avoided significant activity while he had the heart monitor on. Ultimately start him on metoprolol tartrate 12.5 mg twice daily to avoid elevated heart rate response with minimal activity and to tachycardia from atrial arrhythmias.  Reassured them that from cardiac standpoint no major abnormality has been found on the workup thus far.  Also continue with pulmonary evaluation as he is scheduled to have a visit.

## 2023-08-05 NOTE — Assessment & Plan Note (Signed)
 Reviewed results from cardiac CT imaging.  Mild plaque burden reviewed. Continue aspirin, 81 mg once daily is sufficient from cardiac standpoint. 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  Continue atorvastatin 40 mg once daily.

## 2023-08-05 NOTE — Assessment & Plan Note (Addendum)
 Lipid panel from 05/08/2023 total cholesterol 168, HDL 47, triglycerides 158, LDL 96.  Continue with atorvastatin 40 mg once daily.

## 2023-08-05 NOTE — Progress Notes (Signed)
 Cardiology Consultation:    Date:  08/05/2023   ID:  BEAUMONT AUSTAD, DOB [birth year], MRN [id number]  PCP:  Wanda Plump, MD  Cardiologist:  Marlyn Corporal Jolleen Seman, MD   Referring MD: Wanda Plump, MD   No chief complaint on file.    ASSESSMENT AND PLAN:   Mr. Hard 68 year old male with mild nonobstructive coronary artery disease on cardiac CT 07-07-2023, normal biventricular function on echocardiogram with LVEF 60 to 65% February 2025, short runs of SVT up to 10 seconds noted on heart monitor February 2025, hypertension, hyperlipidemia, prediabetes, obstructive sleep apnea-uses CPAP, BPH, Heterozygous for factor V Leyden deficiency, left lower extremity DVT/bilateral PE July - August 2015 in the setting of hip fracture and was on anticoagulation with Xarelto until April 2018.  Here for follow-up visit after his initial presentation for dyspnea on exertion and further evaluation with echocardiogram, cardiac CT and Zio patch monitor.   Problem List Items Addressed This Visit     Hyperlipidemia   Lipid panel from 05/08/2023 total cholesterol 168, HDL 47, triglycerides 158, LDL 96.  Continue with atorvastatin 40 mg once daily.       Relevant Medications   metoprolol tartrate (LOPRESSOR) 25 MG tablet   furosemide (LASIX) 20 MG tablet   Hypertension   Well-controlled currently without any antihypertensive medications.  Continue to monitor. Starting metoprolol tartrate 12.5 mg twice daily as below for dyspnea on exertion and short runs of SVT      Relevant Medications   metoprolol tartrate (LOPRESSOR) 25 MG tablet   furosemide (LASIX) 20 MG tablet   Dyspnea on exertion   No significant obstructive coronary artery disease.  Unlikely to be angina. Diastolic dysfunction reported to be grade 2 on echocardiogram.  However review of images not very convincing. Pulmonary causes could still be contributory.  Recommended low-sodium diet to cut down salt below 2 g/day. Will empirically start  low-dose furosemide 20 mg once daily for 5 days a week.  If this helps with his symptoms, we will obtain blood work tentatively in 30 days to monitor electrolytes and kidney function and continue with furosemide. Advised if he starts getting lightheaded or hypotensive with furosemide dose to discontinue furosemide and let us know.  The other contributory factor for his dyspnea on exertion could be elevated heart rates with activity.  His average heart rate is 83/min and does have runs of SVT.  He avoided significant activity while he had the heart monitor on. Ultimately start him on metoprolol tartrate 12.5 mg twice daily to avoid elevated heart rate response with minimal activity and to tachycardia from atrial arrhythmias.  Reassured them that from cardiac standpoint no major abnormality has been found on the workup thus far.  Also continue with pulmonary evaluation as he is scheduled to have a visit.       CAD (coronary artery disease), moderate nonobstructive atherosclerosis, Ca score 49.9 and plaque volume 274mm3, CAD RADS 1 study involving LAD- Feb/2025 - Primary   Reviewed results from cardiac CT imaging.  Mild plaque burden reviewed. Continue aspirin, 81 mg once daily is sufficient from cardiac standpoint. 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  Continue atorvastatin 40 mg once daily.       Relevant Medications   metoprolol tartrate (LOPRESSOR) 25 MG tablet   furosemide (LASIX) 20 MG tablet   Return to clinic tentatively in 6 months.   History of Present Illness:    Oscar Deardorff  Fuller is a 68 y.o. male who is being seen today for follow-up visit. Last visit with me in the office was 2-10/2023. PCP is Wanda Plump, MD.   Here for the visit accompanied by his wife.  history of heterozygous factor V Leiden deficiency, left lower extremity DVT/bilateral PE [July - August 2015 after hip fracture; was on anticoagulation with Xarelto until April  2018], hypertension, hyperlipidemia, prediabetes, obstructive sleep apnea, BPH was evaluated for dyspnea on exertion and dizziness.  Further evaluation with CT coronary angiogram, heart monitor and an echocardiogram were completed.  Cardiac CT 07-07-2023 showed mild nonobstructive coronary artery disease with CAD RADS 1 [less than 25% stenosis plaque in LAD, total plaque volume to 29 mm cube, calcium score 49.9.  No significant extracardiac findings.  Echocardiogram 07-09-2023 reported normal biventricular function with LVEF 60 to 65% and grade 2 diastolic dysfunction.  No significant valve abnormalities.  However on my review of images diastolic function appears to be normal with septal E prime velocity 60 m/s and lateral E prime velocity 9 cm/s 90 by greater than 1.  No strain imaging available.  Heart monitor 14-day study 06/18/2023 showed average heart rate 83/min [ranging from 47 bpm to 182 bpm], rare ventricular and supraventricular ectopy burden, 11 runs of supraventricular t achycardia with the longest episode 10.6 seconds.  Patient triggered events and diary entries correlated with sinus rhythm with heart rate ranging between 86 to 114 bpm.  Mentions overall his symptoms tend to be off feeling tired and short of breath after walking for few minutes in the yard.  Does get dizzy and lightheaded sometimes with changing position and at times with working in outdoor activities.  Denies any falls or syncope.  Denies any chest pain.  Does have mild bilateral lower extremity pitting pedal edema.  Reviewed the results extensively today.  He mentions he has been starting to use CPAP regularly now.  Lipid panel from 05/08/2023 total cholesterol 168, HDL 47, triglycerides 158, LDL 96.   Past Medical History:  Diagnosis Date   Annual physical exam 10/31/2010   Anxiety    Anxiety and depression 10/16/2009   Qualifier: Diagnosis of   By: Drue Novel MD, Nolon Rod.        Atypical chest pain    with ER visit in  8/11. ETT-myoview showed EF 61% no ischemia or infaraction 8/11   Bilateral pulmonary embolism (HCC) 12/09/2013   BPH (benign prostatic hyperplasia) 08/10/2006   Annotation: BX NORMAL.  Qualifier: Diagnosis of   By: Janit Bern         CAD (coronary artery disease), moderate nonobstructive atherosclerosis, Ca score 49.9 and plaque volume 270mm3, CAD RADS 1 study involving LAD- Feb/2025 07/07/2023   CT coronary angiog 07/07/2023: moderate nonobstructive atherosclerosis, Ca score 49.9 and plaque volume 241mm3, CAD RADS 1 study involving LAD ;   Extracardiac read from Radiologist pending     Chronic anticoagulation 03/28/2014   Clotting disorder (HCC)    DVT 2015   Depression    Dizziness 06/18/2023   DVT (deep venous thrombosis) (HCC)    Dyslipidemia 08/10/2006   Qualifier: Diagnosis of   By: Janit Bern         Dyspnea on exertion 06/18/2023   ED (erectile dysfunction) 07/2008   used some cialis, symptoms not active at present    ELEVATED BLOOD PRESSURE WITHOUT DIAGNOSIS OF HYPERTENSION 07/28/2008   amb BPS ~ 130-140/80-90        History of colon polyps 03/28/2014  Hyperglycemia 09/07/2013   Hyperlipidemia    Hypertension    was on ACEi-diuretics   Increased prostate specific antigen (PSA) velocity    saw Dr Etta Grandchild, s/p Bx(-) 2002,2022   Insomnia 02/14/2014   OBESITY 07/28/2008   Qualifier: Diagnosis of   By: Drue Novel MD, Jose E.        OSA (obstructive sleep apnea) 06/08/2012   HST 05/2012:  AHI 34/hr with desat as low as 69%.-- on CPAP  Auto setting 5-20cm     PCP NOTE >>>>>>>>>>>> 02/26/2015   Prediabetes 09/07/2013   Pulmonary embolism (HCC) 11/2013   PE and L DVT after a trip   SCC (squamous cell carcinoma)    Dr Londell Moh, in the back   Tubular adenoma of colon 08/2008    Past Surgical History:  Procedure Laterality Date   COLONOSCOPY     KNEE SURGERY  2009   left arthroscopy   POLYPECTOMY     PROSTATE BIOPSY  02/2020   benign, biopsy x 2 2002 and 2021 or 2022  both benign   VASECTOMY      Current Medications: Current Meds  Medication Sig   Aspirin 162.5 MG CP24 Take 162 mg by mouth daily.   atorvastatin (LIPITOR) 40 MG tablet Take 1 tablet (40 mg total) by mouth at bedtime.   buPROPion (WELLBUTRIN) 100 MG tablet Take 1 tablet (100 mg total) by mouth 2 (two) times daily.   fluticasone-salmeterol (ADVAIR) 100-50 MCG/ACT AEPB Inhale 1 puff into the lungs 2 (two) times daily.   furosemide (LASIX) 20 MG tablet Take 1 tablet (20 mg total) by mouth as needed for fluid or edema. Take 20 mg once a day 5 days a week.   metoprolol tartrate (LOPRESSOR) 25 MG tablet Take 0.5 tablets (12.5 mg total) by mouth 2 (two) times daily.   Multiple Vitamin (MULTIVITAMIN) tablet Take 1 tablet by mouth daily. Men's once a day   pantoprazole (PROTONIX) 40 MG tablet Take 1 tablet (40 mg total) by mouth daily.   sertraline (ZOLOFT) 100 MG tablet TAKE 1 TABLET BY MOUTH EVERY DAY   tamsulosin (FLOMAX) 0.4 MG CAPS capsule Take 0.4 mg by mouth daily.     Allergies:   Patient has no known allergies.   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[employer], Airline pilot, recruter  Tobacco Use   Smoking status: Never   Smokeless tobacco: Never  Vaping Use   Vaping status: Never Used  Substance and Sexual Activity   Alcohol use: No    Alcohol/week: 0.0 standard drinks of alcohol    Comment:     Drug use: No   Sexual activity: Not on file  Other Topics Concern   Not on file  Social History Narrative   Married x 2  , 2nd wife Zella Ball has a h/o SZ d/o    2 daughters : Charity fundraiser RT      Social Drivers of Health   Financial Resource Strain: Low Risk  (10/08/2022)   Overall Financial Resource Strain (CARDIA)    Difficulty of Paying Living Expenses: Not very hard  Food  Insecurity: Patient Declined (10/08/2022)   Hunger Vital Sign    Worried About Running Out of Food in the Last Year: Patient declined    Barista  in the Last Year: Patient declined  Transportation Needs: No Transportation Needs (10/08/2022)   PRAPARE - Administrator, Civil Service (Medical): No    Lack of Transportation (Non-Medical): No  Physical Activity: Sufficiently Active (10/08/2022)   Exercise Vital Sign    Days of Exercise per Week: 3 days    Minutes of Exercise per Session: 50 min  Stress: No Stress Concern Present (10/08/2022)   [employer] of Occupational Health - Occupational Stress Questionnaire    Feeling of Stress : Only a little  Social Connections: Socially Integrated (10/08/2022)   Social Connection and Isolation Panel [NHANES]    Frequency of Communication with Friends and Family: Twice a week    Frequency of Social Gatherings with Friends and Family: Once a week    Attends Religious Services: More than 4 times per year    Active Member of [employer] or Organizations: Yes    Attends Engineer, structural: More than 4 times per year    Marital Status: Married     Family History: The patient's family history includes Coronary artery disease in his father; Glaucoma in his mother; Gout in his brother; Heart attack in his brother; Heart disease in his mother; Hypertension in his father; Lung cancer in his father; Migraines in his sister; Stomach cancer in his paternal grandfather; Stroke in an other family member. There is no history of Colon cancer, Prostate cancer, Colon polyps, Esophageal cancer, or Rectal cancer. ROS:   Please see the history of present illness.    All 14 point review of systems negative except as described per history of present illness.  EKGs/Labs/Other Studies Reviewed:    The following studies were reviewed today:   EKG:       Recent Labs: 05/08/2023: ALT 24; BUN 14; Creat 1.17; Hemoglobin 16.7; Platelets 215;  Potassium 4.5; Sodium 142; TSH 1.55  Recent Lipid Panel    Component Value Date/Time   CHOL 168 05/08/2023 1507   TRIG 158 (H) 05/08/2023 1507   HDL 47 05/08/2023 1507   CHOLHDL 3.6 05/08/2023 1507   VLDL 23.2 06/16/2022 1337   LDLCALC 96 05/08/2023 1507   LDLDIRECT 173.4 03/09/2013 1037    Physical Exam:    VS:  BP 112/82   Pulse (!) 104   Ht 5\' 10"  (1.778 m)   Wt 241 lb 6.4 oz (109.5 kg)   SpO2 95%   BMI 34.64 kg/m     Wt Readings from Last 3 Encounters:  08/05/23 241 lb 6.4 oz (109.5 kg)  06/18/23 242 lb 1.3 oz (109.8 kg)  05/08/23 237 lb (107.5 kg)     GENERAL:  Well nourished, well developed in no acute distress NECK: No JVD; No carotid bruits CARDIAC: RRR, S1 and S2 present, no murmurs, no rubs, no gallops CHEST:  Clear to auscultation without rales, wheezing or rhonchi  Extremities: Trace bilateral pitting pedal edema. Pulses bilaterally symmetric with radial 2+ and dorsalis pedis 2+ NEUROLOGIC:  Alert and oriented x 3  Medication Adjustments/Labs and Tests Ordered: Current medicines are reviewed at length with the patient today.  Concerns regarding medicines are outlined above.  No orders of the defined types were placed in this encounter.  Meds ordered this encounter  Medications   metoprolol tartrate (LOPRESSOR) 25 MG tablet    Sig: Take 0.5 tablets (12.5 mg total) by mouth 2 (two) times daily.    Dispense:  90 tablet    Refill:  2   furosemide (LASIX) 20 MG  tablet    Sig: Take 1 tablet (20 mg total) by mouth as needed for fluid or edema. Take 20 mg once a day 5 days a week.    Dispense:  30 tablet    Refill:  0    Signed, Dashiel Bergquist reddy Doyne Ellinger, MD, MPH, [facility]. 08/05/2023 1:42 PM    [facility]

## 2023-08-06 DIAGNOSIS — N401 Enlarged prostate with lower urinary tract symptoms: Secondary | ICD-10-CM | POA: Diagnosis not present

## 2023-08-06 LAB — PSA: PSA: 1.41

## 2023-08-13 DIAGNOSIS — R3912 Poor urinary stream: Secondary | ICD-10-CM | POA: Diagnosis not present

## 2023-08-13 DIAGNOSIS — N401 Enlarged prostate with lower urinary tract symptoms: Secondary | ICD-10-CM | POA: Diagnosis not present

## 2023-08-14 ENCOUNTER — Encounter: Payer: Self-pay | Admitting: Internal Medicine

## 2023-09-08 ENCOUNTER — Ambulatory Visit (INDEPENDENT_AMBULATORY_CARE_PROVIDER_SITE_OTHER): Payer: Medicare Other | Admitting: Internal Medicine

## 2023-09-08 VITALS — BP 126/84 | HR 85 | Temp 98.2°F | Resp 16 | Ht 69.5 in | Wt 240.2 lb

## 2023-09-08 DIAGNOSIS — R0609 Other forms of dyspnea: Secondary | ICD-10-CM | POA: Diagnosis not present

## 2023-09-08 DIAGNOSIS — E785 Hyperlipidemia, unspecified: Secondary | ICD-10-CM

## 2023-09-08 DIAGNOSIS — G4733 Obstructive sleep apnea (adult) (pediatric): Secondary | ICD-10-CM | POA: Diagnosis not present

## 2023-09-08 DIAGNOSIS — Z23 Encounter for immunization: Secondary | ICD-10-CM

## 2023-09-08 DIAGNOSIS — E6609 Other obesity due to excess calories: Secondary | ICD-10-CM

## 2023-09-08 LAB — LIPID PANEL
Cholesterol: 197 mg/dL (ref 0–200)
HDL: 41.5 mg/dL (ref >39.00)
LDL Cholesterol: 113 mg/dL — ABNORMAL HIGH (ref 0–99)
NonHDL: 155.82
Total CHOL/HDL Ratio: 5
Triglycerides: 215 mg/dL — ABNORMAL HIGH (ref 0.0–149.0)
VLDL: 43 mg/dL — ABNORMAL HIGH (ref 0.0–40.0)

## 2023-09-08 NOTE — Patient Instructions (Addendum)
 INSTRUCTIONS  FOR TODAY  Watch your diet closely We are referring to to a nutritionist Try to gradually increase your physical activity.    GO TO THE LAB : Get the blood work     Next office visit for a physical exam by December 2025. Please make an appointment before you leave today

## 2023-09-08 NOTE — Progress Notes (Unsigned)
 Subjective:    Patient ID: Oscar Fuller, male    DOB: 10-11-1955, 68 y.o.   MRN: [id number]  DOS:  09/08/2023 Type of visit - description: Routine office visit DOE: Since LOV, saw cardiology, extensive chart review. Cough improved. Please sneezes when eating but no cough. No heartburn per se. Good CPAP compliance    Review of Systems See above   Past Medical History:  Diagnosis Date   Annual physical exam 10/31/2010   Anxiety    Anxiety and depression 10/16/2009   Qualifier: Diagnosis of   By: Neomi Banks MD, Anitra Ket.        Atypical chest pain    with ER visit in 8/11. ETT-myoview showed EF 61% no ischemia or infaraction 8/11   Bilateral pulmonary embolism (HCC) 12/09/2013   BPH (benign prostatic hyperplasia) 08/10/2006   Annotation: BX NORMAL.  Qualifier: Diagnosis of   By: Tracy Friedlander         CAD (coronary artery disease), moderate nonobstructive atherosclerosis, Ca score 49.9 and plaque volume 250mm3, CAD RADS 1 study involving LAD- Feb/2025 07/07/2023   CT coronary angiog 07/07/2023: moderate nonobstructive atherosclerosis, Ca score 49.9 and plaque volume 296mm3, CAD RADS 1 study involving LAD ;   Extracardiac read from Radiologist pending     Chronic anticoagulation 03/28/2014   Clotting disorder (HCC)    DVT 2015   Depression    Dizziness 06/18/2023   DVT (deep venous thrombosis) (HCC)    Dyslipidemia 08/10/2006   Qualifier: Diagnosis of   By: Tracy Friedlander         Dyspnea on exertion 06/18/2023   ED (erectile dysfunction) 07/2008   used some cialis, symptoms not active at present    ELEVATED BLOOD PRESSURE WITHOUT DIAGNOSIS OF HYPERTENSION 07/28/2008   amb BPS ~ 130-140/80-90        History of colon polyps 03/28/2014   Hyperglycemia 09/07/2013   Hyperlipidemia    Hypertension    was on ACEi-diuretics   Increased prostate specific antigen (PSA) velocity    saw Dr Elizabethann Gully, s/p Bx(-) 2002,2022   Insomnia 02/14/2014   OBESITY 07/28/2008   Qualifier: Diagnosis of    By: Neomi Banks MD, Huan Pollok E.        OSA (obstructive sleep apnea) 06/08/2012   HST 05/2012:  AHI 34/hr with desat as low as 69%.-- on CPAP  Auto setting 5-20cm     PCP NOTE >>>>>>>>>>>> 02/26/2015   Prediabetes 09/07/2013   Pulmonary embolism (HCC) 11/2013   PE and L DVT after a trip   SCC (squamous cell carcinoma)    Dr Artice Last, in the back   Tubular adenoma of colon 08/2008    Past Surgical History:  Procedure Laterality Date   COLONOSCOPY     KNEE SURGERY  2009   left arthroscopy   POLYPECTOMY     PROSTATE BIOPSY  02/2020   benign, biopsy x 2 2002 and 2021 or 2022 both benign   VASECTOMY      Current Outpatient Medications  Medication Instructions   Aspirin  162 mg, Daily   atorvastatin  (LIPITOR) 40 mg, Oral, Daily at bedtime   buPROPion  (WELLBUTRIN ) 100 mg, Oral, 2 times daily   fluticasone -salmeterol (ADVAIR) 100-50 MCG/ACT AEPB 1 puff, Inhalation, 2 times daily   furosemide  (LASIX ) 20 mg, Oral, As needed, Take 20 mg once a day 5 days a week.   metoprolol  tartrate (LOPRESSOR ) 12.5 mg, Oral, 2 times daily   Multiple Vitamin (MULTIVITAMIN) tablet 1 tablet, Daily  pantoprazole  (PROTONIX ) 40 mg, Oral, Daily   sertraline  (ZOLOFT ) 100 mg, Oral, Daily   tamsulosin  (FLOMAX ) 0.4 mg, Daily       Objective:   Physical Exam BP 126/84   Pulse 85   Temp 98.2 F (36.8 C) (Oral)   Resp 16   Ht 5' 9.5" (1.765 m)   Wt 240 lb 4 oz (109 kg)   SpO2 97%   BMI 34.97 kg/m  General:   Well developed, NAD, BMI noted. HEENT:  Normocephalic . Face symmetric, atraumatic Lungs:  CTA B Normal respiratory effort, no intercostal retractions, no accessory muscle use. Heart: RRR,  no murmur.  Lower extremities: no pretibial edema bilaterally  Skin: Not pale. Not jaundice Neurologic:  alert & oriented X3.  Speech normal, gait appropriate for age and unassisted Psych--  Cognition and judgment appear intact.  Cooperative with normal attention span and concentration.  Behavior appropriate. No  anxious or depressed appearing.      Assessment    Problem list Prediabetes HTN Hyperlipidemia Depression, anxiety OSA 2014, Dr Matilde Son Hematology Dr.Ennever, heterozygous for factor V Leiden --H/o  PE, DVT 11-2013 after a trip  --Thrombophlebitis 02-2015 -- off xarelto , on ASA 162 since 08-2016 FH CAD Nonobstructive CAD per cardiac CT 07/07/2023.  Coronary calcium  score 49, minimal plaque at the LAD.  EF normal, he was found to have short runs of SVT GU: BPH, LUTS, increased PSA velocity BX 2002  PLAN: Hyperlipidemia: Last LDL 96, I recommended to continue atorvastatin  40 mg.  Recheck FLP. DOE: Saw cardiology, DOE not felt to be cardiac related. On 08/05/2023 they did a trial with a low-dose of furosemide  to see if that helps but the patient never tried. They also recommended low-dose of metoprolol  as elevated heart rate to minimal activity could be a contributing factor.  Good compliance with beta-blockers.  BP is good. At the end, sxs likely multifactorial, including deconditioning and obesity.  We had a long discussion about changing his lifestyle.  Referring him to a nutritionist. HTN: Previously lifestyle controlled, cardiology started metoprolol  07/2023, see above. CAD: Nonobstructive CAD per cardiac CT 07/07/2023.  Coronary calcium  score 49, minimal plaque at the LAD.  EF normal, he was found to have short runs of SVT  OSA: Good CPAP compliance, to see pulmonary soon Cough: See LOV, it has resolved, not taking any inhalers Preventive care: PNM 20 today RTC 04/2024 CPX

## 2023-09-09 NOTE — Assessment & Plan Note (Signed)
 Hyperlipidemia: Last LDL 96, I recommended to continue atorvastatin  40 mg.  Recheck FLP. DOE: Saw cardiology, DOE not felt to be cardiac related. On 08/05/2023 was rx a trial with a low-dose of furosemide  to see if that helped but the patient never tried. They also recommended low-dose of metoprolol  as elevated heart rate w/ minimal activity could be a contributing factor.  Good compliance with beta-blockers.  BP is good. I believe sxs likely multifactorial, including deconditioning and obesity.  We had a long discussion about changing his lifestyle.  Referred to a nutritionist. HTN: Previously lifestyle controlled, cardiology started metoprolol  07/2023, see above. CAD: Nonobstructive CAD per cardiac CT 07/07/2023.  Coronary calcium  score 49, minimal plaque at the LAD.  EF normal, he was found to have short runs of SVT OSA: Good CPAP compliance, to see pulmonary soon Cough: See LOV, it has resolved, not taking any inhalers Preventive care: PNM 20 today RTC 04/2024 CPX

## 2023-09-10 ENCOUNTER — Encounter: Payer: Self-pay | Admitting: Internal Medicine

## 2023-09-14 ENCOUNTER — Other Ambulatory Visit: Payer: Self-pay | Admitting: Internal Medicine

## 2023-09-14 MED ORDER — ATORVASTATIN CALCIUM 40 MG PO TABS
40.0000 mg | ORAL_TABLET | Freq: Every day | ORAL | 3 refills | Status: AC
Start: 2023-09-14 — End: ?

## 2023-09-14 NOTE — Progress Notes (Unsigned)
   Subjective:    Patient ID: Oscar Fuller, male    DOB: April 20, 1956, 68 y.o.   MRN: [id number]  DOS:  09/14/2023 Type of visit - description:     Review of Systems See above   Past Medical History:  Diagnosis Date   Annual physical exam 10/31/2010   Anxiety    Anxiety and depression 10/16/2009   Qualifier: Diagnosis of   By: Neomi Banks MD, Anitra Ket.        Atypical chest pain    with ER visit in 8/11. ETT-myoview showed EF 61% no ischemia or infaraction 8/11   Bilateral pulmonary embolism (HCC) 12/09/2013   BPH (benign prostatic hyperplasia) 08/10/2006   Annotation: BX NORMAL.  Qualifier: Diagnosis of   By: Tracy Friedlander         CAD (coronary artery disease), moderate nonobstructive atherosclerosis, Ca score 49.9 and plaque volume 230mm3, CAD RADS 1 study involving LAD- Feb/2025 07/07/2023   CT coronary angiog 07/07/2023: moderate nonobstructive atherosclerosis, Ca score 49.9 and plaque volume 285mm3, CAD RADS 1 study involving LAD ;   Extracardiac read from Radiologist pending     Chronic anticoagulation 03/28/2014   Clotting disorder (HCC)    DVT 2015   Depression    Dizziness 06/18/2023   DVT (deep venous thrombosis) (HCC)    Dyslipidemia 08/10/2006   Qualifier: Diagnosis of   By: Tracy Friedlander         Dyspnea on exertion 06/18/2023   ED (erectile dysfunction) 07/2008   used some cialis, symptoms not active at present    ELEVATED BLOOD PRESSURE WITHOUT DIAGNOSIS OF HYPERTENSION 07/28/2008   amb BPS ~ 130-140/80-90        History of colon polyps 03/28/2014   Hyperglycemia 09/07/2013   Hyperlipidemia    Hypertension    was on ACEi-diuretics   Increased prostate specific antigen (PSA) velocity    saw Dr Elizabethann Gully, s/p Bx(-) 2002,2022   Insomnia 02/14/2014   OBESITY 07/28/2008   Qualifier: Diagnosis of   By: Neomi Banks MD, Coral Soler E.        OSA (obstructive sleep apnea) 06/08/2012   HST 05/2012:  AHI 34/hr with desat as low as 69%.-- on CPAP  Auto setting 5-20cm     PCP NOTE >>>>>>>>>>>>  02/26/2015   Prediabetes 09/07/2013   Pulmonary embolism (HCC) 11/2013   PE and L DVT after a trip   SCC (squamous cell carcinoma)    Dr Artice Last, in the back   Tubular adenoma of colon 08/2008    Past Surgical History:  Procedure Laterality Date   COLONOSCOPY     KNEE SURGERY  2009   left arthroscopy   POLYPECTOMY     PROSTATE BIOPSY  02/2020   benign, biopsy x 2 2002 and 2021 or 2022 both benign   VASECTOMY      Current Outpatient Medications  Medication Instructions   Aspirin  162 mg, Daily   atorvastatin  (LIPITOR) 40 mg, Oral, Daily at bedtime   buPROPion  (WELLBUTRIN ) 100 mg, Oral, 2 times daily   metoprolol  tartrate (LOPRESSOR ) 12.5 mg, Oral, 2 times daily   Multiple Vitamin (MULTIVITAMIN) tablet 1 tablet, Daily   pantoprazole  (PROTONIX ) 40 mg, Oral, Daily   sertraline  (ZOLOFT ) 100 mg, Oral, Daily   tamsulosin  (FLOMAX ) 0.4 mg, Daily       Objective:   Physical Exam There were no vitals taken for this visit.     Assessment

## 2023-09-16 ENCOUNTER — Ambulatory Visit (INDEPENDENT_AMBULATORY_CARE_PROVIDER_SITE_OTHER)

## 2023-09-16 ENCOUNTER — Encounter: Payer: Self-pay | Admitting: Primary Care

## 2023-09-16 ENCOUNTER — Ambulatory Visit: Admitting: Primary Care

## 2023-09-16 VITALS — BP 128/78 | HR 84 | Ht 69.5 in | Wt 241.8 lb

## 2023-09-16 DIAGNOSIS — G4733 Obstructive sleep apnea (adult) (pediatric): Secondary | ICD-10-CM

## 2023-09-16 DIAGNOSIS — R053 Chronic cough: Secondary | ICD-10-CM | POA: Diagnosis not present

## 2023-09-16 NOTE — Progress Notes (Signed)
 @Patient  ID: Oscar Fuller, male    DOB: 1956-01-03, 68 y.o.   MRN: [id number]  Chief Complaint  Patient presents with   Consult    Sleep Consult- Uses CPAP, snoring    Referring provider: Ezell Hollow, MD  HPI: 68 year old male, never smoked.  Past medical history significant for hypertension, pulmonary embolism, DVT, coronary artery disease, OSA, squamous cell carcinoma, hyperlipidemia, prediabetes, obesity. Former patient of Dr. Matilde Son, last seen on 12/04/2015. DME [facility].   Sleep tests HST 05/19/12 >> AHI 34, SpO2 low 69%  09/16/2023 Discussed the use of AI scribe software for clinical note transcription with the patient, who gave verbal consent to proceed.  History of Present Illness   Oscar Fuller "Athena Bland" is a 67 year old male with severe sleep apnea who presents to reestablish care for sleep apnea management. He was previously seen by Dr. Matilde Son for sleep apnea management.  He has a history of severe sleep apnea, diagnosed in 2014 with a sleep study showing an average of 34 apneic events per hour. He has been using a CPAP machine for approximately 11 years, although usage has been inconsistent due to travel and other factors. His current CPAP machine is the original one he received and is now 66 years old. He has not been using the CPAP consistently in recent months.  He experiences symptoms of snoring, as noted by his wife, and occasional waking up gasping or choking. His sleep is light, with awakenings three to four times a night, and he feels as though he never reaches deep sleep. He goes to bed between 10 and 11 PM, takes no more than 30 minutes to fall asleep, and starts his day between 6:30 and 7:30 AM. He experiences moderate daytime sleepiness, particularly after lunch, and has a history of weight gain over the past few years.  He recently retired and lives with his wife and dog. His home environment has been affected by fine dust due to ongoing renovations, which he believes may  be impacting his respiratory health. He occasionally experiences coughing, which he attributes to the dust in his home environment. A chest x-ray from the previous year showed no pneumonia but some coarsened interstitial markings, possibly related to bronchitis.  His family history includes his father passing away from lung cancer, although he has never smoked. He acknowledges needing more exercise and weight loss.     No Known Allergies  Immunization History  Administered Date(s) Administered   Fluad Trivalent(High Dose 65+) 05/08/2023   Influenza Split 02/10/2012   Influenza,inj,Quad PF,6+ Mos 03/09/2013, 02/14/2014, 01/25/2016, 03/04/2022   PFIZER(Purple Top)SARS-COV-2 Vaccination 08/16/2019, 09/13/2019, 06/25/2020   PNEUMOCOCCAL CONJUGATE-20 09/08/2023   Td 03/04/2002   Tdap 03/29/2012, 03/04/2022   Zoster Recombinant(Shingrix) 09/07/2020, 11/07/2020    Past Medical History:  Diagnosis Date   Annual physical exam 10/31/2010   Anxiety    Anxiety and depression 10/16/2009   Qualifier: Diagnosis of   By: Neomi Banks MD, Anitra Ket.        Atypical chest pain    with ER visit in 8/11. ETT-myoview showed EF 61% no ischemia or infaraction 8/11   Bilateral pulmonary embolism (HCC) 12/09/2013   BPH (benign prostatic hyperplasia) 08/10/2006   Annotation: BX NORMAL.  Qualifier: Diagnosis of   By: Tracy Friedlander         CAD (coronary artery disease), moderate nonobstructive atherosclerosis, Ca score 49.9 and plaque volume 221mm3, CAD RADS 1 study involving LAD- Feb/2025 07/07/2023   CT coronary  angiog 07/07/2023: moderate nonobstructive atherosclerosis, Ca score 49.9 and plaque volume 282mm3, CAD RADS 1 study involving LAD ;   Extracardiac read from Radiologist pending     Chronic anticoagulation 03/28/2014   Clotting disorder (HCC)    DVT 2015   Depression    Dizziness 06/18/2023   DVT (deep venous thrombosis) (HCC)    Dyslipidemia 08/10/2006   Qualifier: Diagnosis of   By: Tracy Friedlander          Dyspnea on exertion 06/18/2023   ED (erectile dysfunction) 07/2008   used some cialis, symptoms not active at present    ELEVATED BLOOD PRESSURE WITHOUT DIAGNOSIS OF HYPERTENSION 07/28/2008   amb BPS ~ 130-140/80-90        History of colon polyps 03/28/2014   Hyperglycemia 09/07/2013   Hyperlipidemia    Hypertension    was on ACEi-diuretics   Increased prostate specific antigen (PSA) velocity    saw Dr Elizabethann Gully, s/p Bx(-) 2002,2022   Insomnia 02/14/2014   OBESITY 07/28/2008   Qualifier: Diagnosis of   By: Neomi Banks MD, Jose E.        OSA (obstructive sleep apnea) 06/08/2012   HST 05/2012:  AHI 34/hr with desat as low as 69%.-- on CPAP  Auto setting 5-20cm     PCP NOTE >>>>>>>>>>>> 02/26/2015   Prediabetes 09/07/2013   Pulmonary embolism (HCC) 11/2013   PE and L DVT after a trip   SCC (squamous cell carcinoma)    Dr Artice Last, in the back   Tubular adenoma of colon 08/2008    Tobacco History: Social History   Tobacco Use  Smoking Status Never  Smokeless Tobacco Never   Counseling given: Not Answered   Outpatient Medications Prior to Visit  Medication Sig Dispense Refill   Aspirin  162.5 MG CP24 Take 162 mg by mouth daily.     atorvastatin  (LIPITOR) 40 MG tablet Take 1 tablet (40 mg total) by mouth at bedtime. 90 tablet 3   buPROPion  (WELLBUTRIN ) 100 MG tablet Take 1 tablet (100 mg total) by mouth 2 (two) times daily. 180 tablet 1   metoprolol  tartrate (LOPRESSOR ) 25 MG tablet Take 0.5 tablets (12.5 mg total) by mouth 2 (two) times daily. 90 tablet 2   Multiple Vitamin (MULTIVITAMIN) tablet Take 1 tablet by mouth daily. Men's once a day     pantoprazole  (PROTONIX ) 40 MG tablet Take 1 tablet (40 mg total) by mouth daily. 30 tablet 4   sertraline  (ZOLOFT ) 100 MG tablet TAKE 1 TABLET BY MOUTH EVERY DAY 90 tablet 0   tamsulosin  (FLOMAX ) 0.4 MG CAPS capsule Take 0.4 mg by mouth daily.     No facility-administered medications prior to visit.   Review of Systems  Review of Systems   Constitutional: Negative.   HENT: Negative.    Respiratory:  Positive for cough. Negative for shortness of breath and wheezing.   Cardiovascular: Negative.      Physical Exam  There were no vitals taken for this visit. Physical Exam Constitutional:      Appearance: Normal appearance.  HENT:     Head: Normocephalic and atraumatic.  Cardiovascular:     Rate and Rhythm: Normal rate and regular rhythm.  Pulmonary:     Effort: Pulmonary effort is normal.     Breath sounds: Normal breath sounds.     Comments: CTA Musculoskeletal:        General: Normal range of motion.     Cervical back: Normal range of motion and neck supple.  Skin:  General: Skin is warm and dry.  Neurological:     General: No focal deficit present.     Mental Status: He is alert and oriented to person, place, and time. Mental status is at baseline.  Psychiatric:        Mood and Affect: Mood normal.        Behavior: Behavior normal.        Thought Content: Thought content normal.        Judgment: Judgment normal.      Lab Results:  CBC    Component Value Date/Time   WBC 8.5 05/08/2023 1507   RBC 5.59 05/08/2023 1507   HGB 16.7 05/08/2023 1507   HGB 16.0 03/09/2017 0815   HCT 49.3 05/08/2023 1507   HCT 45.6 03/09/2017 0815   PLT 215 05/08/2023 1507   PLT 167 03/09/2017 0815   MCV 88.2 05/08/2023 1507   MCV 86 03/09/2017 0815   MCH 29.9 05/08/2023 1507   MCHC 33.9 05/08/2023 1507   RDW 12.6 05/08/2023 1507   RDW 12.9 03/09/2017 0815   LYMPHSABS 2.7 02/25/2022 0809   LYMPHSABS 2.8 03/09/2017 0815   MONOABS 0.7 02/25/2022 0809   EOSABS 238 05/08/2023 1507   EOSABS 0.3 03/09/2017 0815   BASOSABS 60 05/08/2023 1507   BASOSABS 0.0 03/09/2017 0815    BMET    Component Value Date/Time   NA 142 05/08/2023 1507   NA 146 (H) 03/09/2017 0815   NA 144 03/10/2016 1312   K 4.5 05/08/2023 1507   K 3.7 03/09/2017 0815   K 4.4 03/10/2016 1312   CL 107 05/08/2023 1507   CL 108 03/09/2017 0815    CO2 26 05/08/2023 1507   CO2 27 03/09/2017 0815   CO2 28 03/10/2016 1312   GLUCOSE 99 05/08/2023 1507   GLUCOSE 106 03/09/2017 0815   BUN 14 05/08/2023 1507   BUN 14 03/09/2017 0815   BUN 11.3 03/10/2016 1312   CREATININE 1.17 05/08/2023 1507   CREATININE 1.3 03/10/2016 1312   CALCIUM  9.6 05/08/2023 1507   CALCIUM  9.3 03/09/2017 0815   CALCIUM  9.5 03/10/2016 1312   GFRNONAA 76 (L) 04/08/2014 1328   GFRAA 88 (L) 04/08/2014 1328    BNP No results found for: "BNP"  ProBNP    Component Value Date/Time   PROBNP 94.9 04/08/2014 1330    Imaging: No results found.   Assessment & Plan:   1. Chronic cough (Primary) - DG Chest 2 View; Future  2. OSA (obstructive sleep apnea) - Home sleep test; Future  Assessment and Plan    Severe obstructive sleep apnea Severe obstructive sleep apnea diagnosed in 2014 with an average of 34 apneic events per hour. He has been using the same CPAP machine for 11 years, with inconsistent use recently due to travel and other factors. Reports snoring, waking up gasping or choking, and moderate daytime sleepiness. Current CPAP machine is outdated and needs replacement. Insurance may require a repeat sleep study due to break in therapy. A home sleep study is planned to assess current severity and facilitate insurance coverage for a new machine. - Order home sleep study through [facility] for three nights to assess current severity of sleep apnea. - Coordinate with [facility] to replace CPAP machine after sleep study results. - Schedule compliance check 30 to 90 days after receiving new CPAP machine to ensure consistent use and benefit from therapy. - Plan for annual follow-up after initial compliance check.  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 Reports occasional cough, but no acute respiratory symptoms. Previous chest x-ray showed  coarsened interstitial markings, possibly indicative of bronchitis. No evidence of pneumonia or lung cancer. Potential risk of lung fibrosis due to dust exposure, though not suspected at this time. Plans to install a new air conditioning system with filters to reduce dust exposure. - Order repeat chest x-ray to assess for any changes or persistent findings in the lungs. - Consider pulmonary function test if chest x-ray shows persistent or concerning findings. - Recommend installation of air filters, including HEPA filters, in the home to reduce dust exposure.  Antonio Baumgarten, NP 09/16/2023

## 2023-09-16 NOTE — Patient Instructions (Addendum)
  VISIT SUMMARY: You came in today to reestablish care for your severe sleep apnea. We discussed your history with sleep apnea, including your use of a CPAP machine, and your recent symptoms. We also talked about the dust exposure in your home due to renovations and its potential impact on your respiratory health.  YOUR PLAN: -SEVERE OBSTRUCTIVE SLEEP APNEA: Severe obstructive sleep apnea is a condition where your airway becomes blocked during sleep, causing breathing pauses. We will conduct a home sleep study to assess the current severity of your sleep apnea and facilitate insurance coverage for a new CPAP machine. After you receive the new machine, we will schedule a compliance check within 30 to 90 days to ensure you are using it consistently and benefiting from the therapy. An annual follow-up will be planned after the initial compliance check.  -EXPOSURE TO ENVIRONMENTAL DUST: Exposure to fine dust in your home due to renovations may be affecting your respiratory health. We will order a repeat chest x-ray to check for any changes or persistent issues in your lungs. If the x-ray shows concerning findings, we may consider a pulmonary function test. We recommend installing air filters, including HEPA filters, in your home to reduce dust exposure.  INSTRUCTIONS: Please complete the home sleep study as ordered. After the sleep study, coordinate with [facility] to replace your CPAP machine. We will schedule a compliance check 30 to 90 days after you receive the new machine. Additionally, please get a repeat chest x-ray to assess your lung health. Consider installing air filters in your home to reduce dust exposure.  Orders: Home sleep study   Follow-up 3 months with Beth NP for CPAP compliance

## 2023-09-17 NOTE — Progress Notes (Signed)
 Please let patient know CXR showed clear lungs, no acute process

## 2023-09-21 ENCOUNTER — Telehealth: Payer: Self-pay

## 2023-09-21 NOTE — Telephone Encounter (Signed)
 Copied from CRM [id number]. Topic: Appointments - Scheduling Inquiry for Clinic >> Sep 18, 2023  1:20 PM Crist Dominion wrote: Reason for CRM: Patient states he missed a call from Hiyab Nhem to schedule his at home sleep study. Please call patient back when available.  I don't schedule HST. I will route to [facility].

## 2023-09-22 NOTE — Telephone Encounter (Signed)
 Called Pt. Pt will be calling [facility] to update his Insurance info. NFN

## 2023-10-09 ENCOUNTER — Other Ambulatory Visit: Payer: Self-pay | Admitting: Internal Medicine

## 2023-10-13 ENCOUNTER — Encounter

## 2023-10-13 DIAGNOSIS — G4733 Obstructive sleep apnea (adult) (pediatric): Secondary | ICD-10-CM

## 2023-10-13 DIAGNOSIS — G4730 Sleep apnea, unspecified: Secondary | ICD-10-CM | POA: Diagnosis not present

## 2023-11-07 DIAGNOSIS — G4733 Obstructive sleep apnea (adult) (pediatric): Secondary | ICD-10-CM | POA: Diagnosis not present

## 2023-11-10 ENCOUNTER — Ambulatory Visit: Payer: Self-pay | Admitting: Primary Care

## 2023-11-10 DIAGNOSIS — G4733 Obstructive sleep apnea (adult) (pediatric): Secondary | ICD-10-CM

## 2023-11-12 NOTE — Progress Notes (Signed)
 I called and spoke to pt. Pt is informed of Beth's note and verbalized understanding. Pt will use [facility]. Placing CPAP order. NFN

## 2023-11-17 ENCOUNTER — Telehealth: Payer: Self-pay | Admitting: Adult Health

## 2023-11-17 NOTE — Telephone Encounter (Signed)
 Hey do you mind signing this order? Thank you

## 2023-11-18 NOTE — Telephone Encounter (Signed)
Should be signed

## 2023-11-18 NOTE — Telephone Encounter (Signed)
 Thank you. NFN

## 2023-12-02 ENCOUNTER — Telehealth: Payer: Self-pay

## 2023-12-02 NOTE — Telephone Encounter (Signed)
 Received CMN from [facility] for [employer].  Order prepped and placed on Beth's desk in B POD for signature.  Once signed, will fax back to [facility]. Services at [phone].  Forms faxed.

## 2023-12-09 ENCOUNTER — Ambulatory Visit

## 2023-12-14 ENCOUNTER — Ambulatory Visit (INDEPENDENT_AMBULATORY_CARE_PROVIDER_SITE_OTHER): Admitting: *Deleted

## 2023-12-14 ENCOUNTER — Telehealth: Payer: Self-pay | Admitting: *Deleted

## 2023-12-14 VITALS — Ht 69.5 in | Wt 235.0 lb

## 2023-12-14 DIAGNOSIS — Z0000 Encounter for general adult medical examination without abnormal findings: Secondary | ICD-10-CM | POA: Diagnosis not present

## 2023-12-14 NOTE — Progress Notes (Signed)
 Subjective:   Oscar Fuller is a 68 y.o. who presents for a Medicare Wellness preventive visit.  As a reminder, Annual Wellness Visits don't include a physical exam, and some assessments may be limited, especially if this visit is performed virtually. We may recommend an in-person follow-up visit with your provider if needed.  Visit Complete: Virtual I connected with  [name] on 12/14/23 by a audio enabled telemedicine application and verified that I am speaking with the correct person using two identifiers.  Patient Location: Home  Provider Location: Office/Clinic  I discussed the limitations of evaluation and management by telemedicine. The patient expressed understanding and agreed to proceed.  Vital Signs: Because this visit was a virtual/telehealth visit, some criteria may be missing or patient reported. Any vitals not documented were not able to be obtained and vitals that have been documented are patient reported.  VideoDeclined- This patient declined Librarian, academic. Therefore the visit was completed with audio only.  Persons Participating in Visit: Patient.  AWV Questionnaire: Yes: Patient Medicare AWV questionnaire was completed by the patient on 12/09/23; I have confirmed that all information answered by patient is correct and no changes since this date.  Cardiac Risk Factors include: advanced age (>21men, >51 women);obesity (BMI >30kg/m2);male gender;hypertension;dyslipidemia;Other (see comment), Risk factor comments: CAD, OSA, History of PE    Objective:    Today's Vitals   12/14/23 1438  Weight: 235 lb (106.6 kg)  Height: 5' 9.5 (1.765 m)   Body mass index is 34.21 kg/m.     12/14/2023    3:56 PM 04/23/2021   10:28 AM 03/09/2017    8:40 AM 09/08/2016   12:20 PM 03/10/2016    1:37 PM 09/06/2015    1:36 PM 04/26/2015    2:20 PM  Advanced Directives  Does Patient Have a Medical Advance Directive? Yes Yes No  Yes  Yes  Yes   Yes   Type of Advance Directive Living will Healthcare Power of [place];Living will  Healthcare Power of [place];Living will Healthcare Power of [place];Living will  Healthcare Power of [place];Living will  Healthcare Power of [place];Living will   Does patient want to make changes to medical advance directive? No - Patient declined No - Patient declined   No - Patient declined     Copy of Healthcare Power of Attorney in Chart?  No - copy requested   No - copy requested  No - copy requested  No - copy requested      Data saved with a previous flowsheet row definition    Current Medications (verified) Outpatient Encounter Medications as of 12/14/2023  Medication Sig   Aspirin  162.5 MG CP24 Take 162 mg by mouth daily.   atorvastatin  (LIPITOR) 40 MG tablet Take 1 tablet (40 mg total) by mouth at bedtime.   buPROPion  (WELLBUTRIN ) 100 MG tablet Take 1 tablet (100 mg total) by mouth 2 (two) times daily.   Multiple Vitamin (MULTIVITAMIN) tablet Take 1 tablet by mouth daily. Men's once a day   sertraline  (ZOLOFT ) 100 MG tablet Take 1 tablet (100 mg total) by mouth daily.   tamsulosin  (FLOMAX ) 0.4 MG CAPS capsule Take 0.4 mg by mouth daily.   metoprolol  tartrate (LOPRESSOR ) 25 MG tablet Take 0.5 tablets (12.5 mg total) by mouth 2 (two) times daily. (Patient not taking: Reported on 12/14/2023)   [DISCONTINUED] pantoprazole  (PROTONIX ) 40 MG tablet Take 1 tablet (40 mg total) by mouth daily. (Patient not taking: Reported on 09/16/2023)   No  facility-administered encounter medications on file as of 12/14/2023.    Allergies (verified) Patient has no known allergies.   History: Past Medical History:  Diagnosis Date   Annual physical exam 10/31/2010   Anxiety    Anxiety and depression 10/16/2009   Qualifier: Diagnosis of   By: Amon MD, Aloysius BRAVO.        Atypical chest pain    with ER visit in 8/11. ETT-myoview showed EF 61% no ischemia or infaraction 8/11   Bilateral pulmonary embolism (HCC) 12/09/2013    BPH (benign prostatic hyperplasia) 08/10/2006   Annotation: BX NORMAL.  Qualifier: Diagnosis of   By: Antonio ROSALEA Rockers         CAD (coronary artery disease), moderate nonobstructive atherosclerosis, Ca score 49.9 and plaque volume 224mm3, CAD RADS 1 study involving LAD- Feb/2025 07/07/2023   CT coronary angiog 07/07/2023: moderate nonobstructive atherosclerosis, Ca score 49.9 and plaque volume 255mm3, CAD RADS 1 study involving LAD ;   Extracardiac read from Radiologist pending     Chronic anticoagulation 03/28/2014   Clotting disorder (HCC)    DVT 2015   Depression    Dizziness 06/18/2023   DVT (deep venous thrombosis) (HCC)    Dyslipidemia 08/10/2006   Qualifier: Diagnosis of   By: Antonio ROSALEA Rockers         Dyspnea on exertion 06/18/2023   ED (erectile dysfunction) 07/2008   used some cialis, symptoms not active at present    ELEVATED BLOOD PRESSURE WITHOUT DIAGNOSIS OF HYPERTENSION 07/28/2008   amb BPS ~ 130-140/80-90        History of colon polyps 03/28/2014   Hyperglycemia 09/07/2013   Hyperlipidemia    Hypertension    was on ACEi-diuretics   Increased prostate specific antigen (PSA) velocity    saw Dr Kathlynn, s/p Bx(-) 2002,2022   Insomnia 02/14/2014   OBESITY 07/28/2008   Qualifier: Diagnosis of   By: Amon MD, Jose E.        OSA (obstructive sleep apnea) 06/08/2012   HST 05/2012:  AHI 34/hr with desat as low as 69%.-- on CPAP  Auto setting 5-20cm     PCP NOTE >>>>>>>>>>>> 02/26/2015   Prediabetes 09/07/2013   Pulmonary embolism (HCC) 11/2013   PE and L DVT after a trip   SCC (squamous cell carcinoma)    Dr Dwane, in the back   Tubular adenoma of colon 08/2008   Past Surgical History:  Procedure Laterality Date   COLONOSCOPY     KNEE SURGERY  2009   left arthroscopy   POLYPECTOMY     PROSTATE BIOPSY  02/2020   benign, biopsy x 2 2002 and 2021 or 2022 both benign   VASECTOMY     Family History  Problem Relation Age of Onset   Glaucoma Mother    Heart disease  Mother    Coronary artery disease Father        stent 22, GF GM   Hypertension Father        F B   Lung cancer Father    Migraines Sister    Heart attack Brother        F B M GM   Gout Brother    Stomach cancer Paternal Grandfather    Stroke Other        CVA GM   Colon cancer Neg Hx    Prostate cancer Neg Hx    Colon polyps Neg Hx    Esophageal cancer Neg Hx    Rectal cancer Neg  Hx    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[employer], Airline pilot, recruter  Tobacco Use   Smoking status: Never   Smokeless tobacco: Never  Vaping Use   Vaping status: Never Used  Substance and Sexual Activity   Alcohol use: No    Alcohol/week: 0.0 standard drinks of alcohol    Comment:     Drug use: No   Sexual activity: Not on file  Other Topics Concern   Not on file  Social History Narrative   Married x 2  , 2nd wife Grayce has a h/o SZ d/o    2 daughters : Charity fundraiser RT      Social Drivers of Corporate investment banker Strain: Low Risk  (12/09/2023)   Overall Financial Resource Strain (CARDIA)    Difficulty of Paying Living Expenses: Not hard at all  Food Insecurity: No Food Insecurity (12/09/2023)   Hunger Vital Sign    Worried About Running Out of Food in the Last Year: Never true    Ran Out of Food in the Last Year: Never true  Transportation Needs: No Transportation Needs (12/09/2023)   PRAPARE - Administrator, Civil Service (Medical): No    Lack of Transportation (Non-Medical): No  Physical Activity: Insufficiently Active (12/09/2023)   Exercise Vital Sign    Days of Exercise per Week: 3 days    Minutes of Exercise per Session: 30 min  Stress: No Stress Concern Present (12/09/2023)   [employer] of Occupational Health - Occupational Stress Questionnaire     Feeling of Stress: Only a little  Social Connections: Socially Integrated (12/09/2023)   Social Connection and Isolation Panel    Frequency of Communication with Friends and Family: More than three times a week    Frequency of Social Gatherings with Friends and Family: Once a week    Attends Religious Services: More than 4 times per year    Active Member of [employer] or Organizations: Yes    Attends Engineer, structural: More than 4 times per year    Marital Status: Married    Tobacco Counseling Counseling given: Not Answered  Clinical Intake:  Pre-visit preparation completed: Yes Pain : No/denies pain     BMI - recorded: 34.21 Nutritional Status: BMI > 30  Obese Nutritional Risks: None Diabetes: No  Lab Results  Component Value Date   HGBA1C 5.7 (H) 05/08/2023   HGBA1C 5.8 02/25/2022   HGBA1C 5.7 09/07/2020     How often do you need to have someone help you when you read instructions, pamphlets, or other written materials from your doctor or pharmacy?: 1 - Never What is the last grade level you completed in school?: Bachelor's degree  Interpreter Needed?: No  Information entered by :: Lolita Libra, CMA   Activities of Daily Living     12/09/2023    7:59 AM  In your present state of health, do you have any difficulty performing the following activities:  Hearing? 0  Vision? 0  Difficulty concentrating or making decisions? 0  Walking or climbing stairs? 0  Dressing or bathing? 0  Doing errands, shopping? 0  Preparing Food and eating ? N  Using the Toilet? N  In the past six months, have you accidently leaked urine?  N  Do you have problems with loss of bowel control? N  Managing your Medications? N  Managing your Finances? N  Housekeeping or managing your Housekeeping? N    Patient Care Team: Amon Aloysius BRAVO, MD as PCP - General Timmy Maude SAUNDERS, MD as Consulting Physician (Oncology) Cam Morene ORN, MD as Attending Physician (Urology) Aneita Gwendlyn DASEN, MD (Inactive) as Consulting Physician (Gastroenterology) [facility], [place]  I have updated your Care Teams any recent Medical Services you may have received from other providers in the past year.     Assessment:   This is a routine wellness examination for [place].  Hearing/Vision screen Hearing Screening - Comments:: States hearing is ok, notes having to concentrate more when someone speaks low. Vision Screening - Comments:: Last eye exam 1 1/2 yrs ago with  [facility]. Pt will call them to schedule.    Goals Addressed               This Visit's Progress     Patient Stated (pt-stated)        He wants to increase physical activity (currently doing 3 days / each) and lose 25 pounds, current weight 235lb.       Depression Screen     12/14/2023    3:58 PM 09/08/2023   10:26 AM 05/08/2023    2:14 PM 06/16/2022    1:29 PM 03/04/2022    3:02 PM 03/22/2021    1:30 PM 09/07/2020    9:33 AM  PHQ 2/9 Scores  PHQ - 2 Score 0 1 0 3 2 1 1   PHQ- 9 Score 0 3 0 11 12 6 6     Fall Risk     12/09/2023    7:59 AM 09/16/2023   10:04 AM 09/08/2023    9:53 AM 05/08/2023    2:14 PM 06/16/2022    1:05 PM  Fall Risk   Falls in the past year? 0 0 0 0 1  Number falls in past yr: 0  0 0 0  Injury with Fall? 0  0 0 0  Risk for fall due to : No Fall Risks   No Fall Risks   Follow up Education provided  Falls evaluation completed;Education provided Falls evaluation completed Falls evaluation completed    MEDICARE RISK AT HOME:  Medicare Risk at Home Any stairs in or around the home?: (Patient-Rptd) Yes If so, are there any without handrails?: (Patient-Rptd) No Home free of loose throw rugs in walkways, pet beds, electrical cords, etc?: (Patient-Rptd) Yes Adequate lighting in your home to reduce risk of falls?: (Patient-Rptd) Yes Life alert?: (Patient-Rptd) No Use of a cane, walker or w/c?: (Patient-Rptd) No Grab bars in the bathroom?: (Patient-Rptd) Yes Shower  chair or bench in shower?: (Patient-Rptd) No Elevated toilet seat or a handicapped toilet?: (Patient-Rptd) Yes  TIMED UP AND GO:  Was the test performed?  No,audio  Cognitive Function: 6CIT completed        12/14/2023    3:57 PM  6CIT Screen  What Year? 0 points  What month? 0 points  What time? 0 points  Count back from 20 0 points  Months in reverse 0 points  Repeat phrase 0 points  Total Score 0 points   Immunizations Immunization History  Administered Date(s) Administered   Fluad Trivalent(High Dose 65+) 05/08/2023   Influenza Split 02/10/2012   Influenza,inj,Quad PF,6+ Mos 03/09/2013, 02/14/2014, 01/25/2016, 03/04/2022   PFIZER(Purple Top)SARS-COV-2 Vaccination 08/16/2019, 09/13/2019, 06/25/2020  PNEUMOCOCCAL CONJUGATE-20 09/08/2023   Td 03/04/2002   Tdap 03/29/2012, 03/04/2022   Zoster Recombinant(Shingrix) 09/07/2020, 11/07/2020    Screening Tests Health Maintenance  Topic Date Due   Medicare Annual Wellness (AWV)  Never done   INFLUENZA VACCINE  12/11/2023   COVID-19 Vaccine (4 - 2024-25 season) 12/13/2024 (Originally 01/11/2023)   Colonoscopy  04/23/2024   DTaP/Tdap/Td (4 - Td or Tdap) 03/04/2032   Pneumococcal Vaccine: 50+ Years  Completed   Hepatitis C Screening  Completed   Zoster Vaccines- Shingrix  Completed   Hepatitis B Vaccines  Aged Out   HPV VACCINES  Aged Out   Meningococcal B Vaccine  Aged Out    Health Maintenance  Health Maintenance Due  Topic Date Due   Medicare Annual Wellness (AWV)  Never done   INFLUENZA VACCINE  12/11/2023   Health Maintenance Items Addressed: All HM up to date  Additional Screening:  Vision Screening: Recommended annual ophthalmology exams for early detection of glaucoma and other disorders of the eye. Would you like a referral to an eye doctor? No    Dental Screening: Recommended annual dental exams for proper oral hygiene  Community Resource Referral / Chronic Care Management: CRR required this visit?   No   CCM required this visit?  No   Plan:    I have personally reviewed and noted the following in the patient's chart:   Medical and social history Use of alcohol, tobacco or illicit drugs  Current medications and supplements including opioid prescriptions. Patient is not currently taking opioid prescriptions. Functional ability and status Nutritional status Physical activity Advanced directives List of other physicians Hospitalizations, surgeries, and ER visits in previous 12 months Vitals Screenings to include cognitive, depression, and falls Referrals and appointments  In addition, I have reviewed and discussed with patient certain preventive protocols, quality metrics, and best practice recommendations. A written personalized care plan for preventive services as well as general preventive health recommendations were provided to patient.   Lolita Libra, CMA   12/14/2023   After Visit Summary: (MyChart) Due to this being a telephonic visit, the after visit summary with patients personalized plan was offered to patient via MyChart   Notes: See phone note.

## 2023-12-14 NOTE — Patient Instructions (Addendum)
 Oscar Fuller , Thank you for taking time out of your busy schedule to complete your Annual Wellness Visit with me. I enjoyed our conversation and look forward to speaking with you again next year. I, as well as your care team,  appreciate your ongoing commitment to your health goals. Please review the following plan we discussed and let me know if I can assist you in the future. Your Game plan/ To Do List    Follow up Visits: Next Medicare AWV with our clinical staff:   12/14/24 1pm  Next Office Visit with your provider: 05/09/24 9:20am  Clinician Recommendations:  Aim for 30 minutes of exercise or brisk walking, 6-8 glasses of water, and 5 servings of fruits and vegetables each day.    Please remember to get your flu vaccine in September either in our office or at your pharmacy.     Also, please let your cardiologist know that you are not taking the metoprolol  and are trying diet/exercise to lose weight to see if symptoms resolve.  This is a list of the screening recommended for you and due dates:  Health Maintenance  Topic Date Due   Medicare Annual Wellness Visit  Never done   Flu Shot  12/11/2023   COVID-19 Vaccine (4 - 2024-25 season) 12/13/2024*   Colon Cancer Screening  04/23/2024   DTaP/Tdap/Td vaccine (4 - Td or Tdap) 03/04/2032   Pneumococcal Vaccine for age over 66  Completed   Hepatitis C Screening  Completed   Zoster (Shingles) Vaccine  Completed   Hepatitis B Vaccine  Aged Out   HPV Vaccine  Aged Out   Meningitis B Vaccine  Aged Out  *Topic was postponed. The date shown is not the original due date.    Advanced directives: (Copy Requested) Please bring a copy of your health care power of attorney and living will to the office to be added to your chart at your convenience. You can mail to [facility] [address] [place], [place] [address] or email to [email or web address] Advance Care Planning is important because it:  [x]  Makes sure you receive  the medical care that is consistent with your values, goals, and preferences  [x]  It provides guidance to your family and loved ones and reduces their decisional burden about whether or not they are making the right decisions based on your wishes.  Follow the link provided in your after visit summary or read over the paperwork we have mailed to you to help you started getting your Advance Directives in place. If you need assistance in completing these, please reach out to us  so that we can help you!  See attachments for Preventive Care and Fall Prevention Tips.

## 2023-12-14 NOTE — Telephone Encounter (Signed)
 FYI:  pt had AWV today.  Reports that he isn't taking metoprolol  (prescribed by cardiology). States they prescribed this thinking his heart rate was accelerating when he was active due to his weight and causing dizziness.  Pt states he has decided to try diet and exercise first and doesn't feel symptomatic. Has lost 5 pounds currently. Have also advised pt to make cardiology aware.

## 2023-12-16 NOTE — Telephone Encounter (Signed)
 Noted, thank you

## 2023-12-28 ENCOUNTER — Ambulatory Visit: Admitting: Primary Care

## 2023-12-28 DIAGNOSIS — R053 Chronic cough: Secondary | ICD-10-CM

## 2023-12-28 DIAGNOSIS — G4733 Obstructive sleep apnea (adult) (pediatric): Secondary | ICD-10-CM

## 2024-01-01 DIAGNOSIS — G4733 Obstructive sleep apnea (adult) (pediatric): Secondary | ICD-10-CM | POA: Diagnosis not present

## 2024-04-21 ENCOUNTER — Ambulatory Visit: Admitting: Primary Care

## 2024-04-21 ENCOUNTER — Encounter: Payer: Self-pay | Admitting: Primary Care

## 2024-04-21 VITALS — BP 128/64 | HR 72 | Temp 97.7°F | Ht 70.0 in | Wt 240.4 lb

## 2024-04-21 DIAGNOSIS — G4733 Obstructive sleep apnea (adult) (pediatric): Secondary | ICD-10-CM | POA: Diagnosis not present

## 2024-04-21 NOTE — Progress Notes (Signed)
 @Patient  ID: JESSIE SCHRIEBER, male    DOB: 16-Dec-1955, 68 y.o.   MRN: [id number]  Chief Complaint  Patient presents with   Obstructive Sleep Apnea    Wearing CPAP-doing well.    Referring provider: Amon Aloysius BRAVO, MD  HPI: 67 year old male, never smoked.  Past medical history significant for hypertension, pulmonary embolism, DVT, coronary artery disease, OSA, squamous cell carcinoma, hyperlipidemia, prediabetes, obesity. Former patient of Dr. Shellia, last seen on 12/04/2015. DME [facility].   Sleep tests HST 05/19/12 >> AHI 34, SpO2 low 69%  Previous [facility] pulmonary encounter:  09/16/2023 Discussed the use of AI scribe software for clinical note transcription with the patient, who gave verbal consent to proceed.  History of Present Illness   Laken Lobato Polimeni Garrel is a 68 year old male with severe sleep apnea who presents to reestablish care for sleep apnea management. He was previously seen by Dr. Shellia for sleep apnea management.  He has a history of severe sleep apnea, diagnosed in 2014 with a sleep study showing an average of 34 apneic events per hour. He has been using a CPAP machine for approximately 11 years, although usage has been inconsistent due to travel and other factors. His current CPAP machine is the original one he received and is now 66 years old. He has not been using the CPAP consistently in recent months.  He experiences symptoms of snoring, as noted by his wife, and occasional waking up gasping or choking. His sleep is light, with awakenings three to four times a night, and he feels as though he never reaches deep sleep. He goes to bed between 10 and 11 PM, takes no more than 30 minutes to fall asleep, and starts his day between 6:30 and 7:30 AM. He experiences moderate daytime sleepiness, particularly after lunch, and has a history of weight gain over the past few years.  He recently retired and lives with his wife and dog. His home environment has been affected by fine dust due to  ongoing renovations, which he believes may be impacting his respiratory health. He occasionally experiences coughing, which he attributes to the dust in his home environment. A chest x-ray from the previous year showed no pneumonia but some coarsened interstitial markings, possibly related to bronchitis.  His family history includes his father passing away from lung cancer, although he has never smoked. He acknowledges needing more exercise and weight loss.     04/21/2024- Interim hx  Discussed the use of AI scribe software for clinical note transcription with the patient, who gave verbal consent to proceed.  History of Present Illness Jarom Govan Rocco Garrel is a 68 year old male with severe sleep apnea who presents for follow-up regarding his CPAP therapy.  He has a history of severe sleep apnea diagnosed in 2014, with 34 apneic events per hour. He has been using a CPAP machine for approximately 11 years. Recently, he received a replacement CPAP machine and has been using it regularly, although there were a few nights he did not use it due to illness and an emergency work trip to [place] . He uses the CPAP machine nightly, with an average usage of 4 hours and 40 minutes per night, and his apnea score is 2.7.  He uses a full face mask with the CPAP machine and experiences claustrophobia when first trying to sleep. To help with this, he sometimes takes ibuprofen PM. He reports no issues with the new machine or its pressure settings, which range from  5 to 20.  He also has a persistent cough, although a chest x-ray performed in May showed clear lungs. The cough is still present.      Allergies[1]  Immunization History  Administered Date(s) Administered   Fluad Trivalent(High Dose 65+) 05/08/2023   Influenza Split 02/10/2012   Influenza,inj,Quad PF,6+ Mos 03/09/2013, 02/14/2014, 01/25/2016, 03/04/2022   PFIZER(Purple Top)SARS-COV-2 Vaccination 08/16/2019, 09/13/2019, 06/25/2020    PNEUMOCOCCAL CONJUGATE-20 09/08/2023   Td 03/04/2002   Tdap 03/29/2012, 03/04/2022   Zoster Recombinant(Shingrix) 09/07/2020, 11/07/2020    Past Medical History:  Diagnosis Date   Annual physical exam 10/31/2010   Anxiety    Anxiety and depression 10/16/2009   Qualifier: Diagnosis of   By: Amon MD, Aloysius BRAVO.        Atypical chest pain    with ER visit in 8/11. ETT-myoview showed EF 61% no ischemia or infaraction 8/11   Bilateral pulmonary embolism (HCC) 12/09/2013   BPH (benign prostatic hyperplasia) 08/10/2006   Annotation: BX NORMAL.  Qualifier: Diagnosis of   By: Antonio ROSALEA Rockers         CAD (coronary artery disease), moderate nonobstructive atherosclerosis, Ca score 49.9 and plaque volume 279mm3, CAD RADS 1 study involving LAD- Feb/2025 07/07/2023   CT coronary angiog 07/07/2023: moderate nonobstructive atherosclerosis, Ca score 49.9 and plaque volume 256mm3, CAD RADS 1 study involving LAD ;   Extracardiac read from Radiologist pending     Chronic anticoagulation 03/28/2014   Clotting disorder    DVT 2015   Depression    Dizziness 06/18/2023   DVT (deep venous thrombosis) (HCC)    Dyslipidemia 08/10/2006   Qualifier: Diagnosis of   By: Antonio ROSALEA Rockers         Dyspnea on exertion 06/18/2023   ED (erectile dysfunction) 07/2008   used some cialis, symptoms not active at present    ELEVATED BLOOD PRESSURE WITHOUT DIAGNOSIS OF HYPERTENSION 07/28/2008   amb BPS ~ 130-140/80-90        History of colon polyps 03/28/2014   Hyperglycemia 09/07/2013   Hyperlipidemia    Hypertension    was on ACEi-diuretics   Increased prostate specific antigen (PSA) velocity    saw Dr Kathlynn, s/p Bx(-) 2002,2022   Insomnia 02/14/2014   OBESITY 07/28/2008   Qualifier: Diagnosis of   By: Amon MD, Jose E.        OSA (obstructive sleep apnea) 06/08/2012   HST 05/2012:  AHI 34/hr with desat as low as 69%.-- on CPAP  Auto setting 5-20cm     PCP NOTE >>>>>>>>>>>> 02/26/2015   Prediabetes 09/07/2013    Pulmonary embolism (HCC) 11/2013   PE and L DVT after a trip   SCC (squamous cell carcinoma)    Dr Dwane, in the back   Tubular adenoma of colon 08/2008    Tobacco History: Tobacco Use History[2] Counseling given: Not Answered   Outpatient Medications Prior to Visit  Medication Sig Dispense Refill   Aspirin  162.5 MG CP24 Take 162 mg by mouth daily.     atorvastatin  (LIPITOR) 40 MG tablet Take 1 tablet (40 mg total) by mouth at bedtime. 90 tablet 3   buPROPion  (WELLBUTRIN ) 100 MG tablet Take 1 tablet (100 mg total) by mouth 2 (two) times daily. 180 tablet 1   Multiple Vitamin (MULTIVITAMIN) tablet Take 1 tablet by mouth daily. Men's once a day     sertraline  (ZOLOFT ) 100 MG tablet Take 1 tablet (100 mg total) by mouth daily. 90 tablet 2   tamsulosin  (FLOMAX )  0.4 MG CAPS capsule Take 0.4 mg by mouth daily.     metoprolol  tartrate (LOPRESSOR ) 25 MG tablet Take 0.5 tablets (12.5 mg total) by mouth 2 (two) times daily. (Patient not taking: Reported on 04/21/2024) 90 tablet 2   No facility-administered medications prior to visit.    Review of Systems  Review of Systems  Constitutional: Negative.   HENT: Negative.    Respiratory: Negative.    Cardiovascular: Negative.    Physical Exam  BP 128/64   Pulse 72   Temp 97.7 F (36.5 C)   Ht 5' 10 (1.778 m)   Wt 240 lb 6.4 oz (109 kg)   SpO2 96% Comment: RA  BMI 34.49 kg/m  Physical Exam Constitutional:      Appearance: Normal appearance. He is well-developed.  HENT:     Head: Normocephalic and atraumatic.     Mouth/Throat:     Mouth: Mucous membranes are moist.     Pharynx: Oropharynx is clear.  Cardiovascular:     Rate and Rhythm: Normal rate and regular rhythm.     Heart sounds: Normal heart sounds.  Pulmonary:     Effort: Pulmonary effort is normal. No respiratory distress.     Breath sounds: Normal breath sounds. No wheezing or rhonchi.  Musculoskeletal:        General: Normal range of motion.     Cervical back:  Normal range of motion and neck supple.  Skin:    General: Skin is warm and dry.     Findings: No erythema or rash.  Neurological:     General: No focal deficit present.     Mental Status: He is alert and oriented to person, place, and time. Mental status is at baseline.  Psychiatric:        Mood and Affect: Mood normal.        Behavior: Behavior normal.        Thought Content: Thought content normal.        Judgment: Judgment normal.      Lab Results:  CBC    Component Value Date/Time   WBC 8.5 05/08/2023 1507   RBC 5.59 05/08/2023 1507   HGB 16.7 05/08/2023 1507   HGB 16.0 03/09/2017 0815   HCT 49.3 05/08/2023 1507   HCT 45.6 03/09/2017 0815   PLT 215 05/08/2023 1507   PLT 167 03/09/2017 0815   MCV 88.2 05/08/2023 1507   MCV 86 03/09/2017 0815   MCH 29.9 05/08/2023 1507   MCHC 33.9 05/08/2023 1507   RDW 12.6 05/08/2023 1507   RDW 12.9 03/09/2017 0815   LYMPHSABS 2.7 02/25/2022 0809   LYMPHSABS 2.8 03/09/2017 0815   MONOABS 0.7 02/25/2022 0809   EOSABS 238 05/08/2023 1507   EOSABS 0.3 03/09/2017 0815   BASOSABS 60 05/08/2023 1507   BASOSABS 0.0 03/09/2017 0815    BMET    Component Value Date/Time   NA 142 05/08/2023 1507   NA 146 (H) 03/09/2017 0815   NA 144 03/10/2016 1312   K 4.5 05/08/2023 1507   K 3.7 03/09/2017 0815   K 4.4 03/10/2016 1312   CL 107 05/08/2023 1507   CL 108 03/09/2017 0815   CO2 26 05/08/2023 1507   CO2 27 03/09/2017 0815   CO2 28 03/10/2016 1312   GLUCOSE 99 05/08/2023 1507   GLUCOSE 106 03/09/2017 0815   BUN 14 05/08/2023 1507   BUN 14 03/09/2017 0815   BUN 11.3 03/10/2016 1312   CREATININE 1.17 05/08/2023 1507  CREATININE 1.3 03/10/2016 1312   CALCIUM  9.6 05/08/2023 1507   CALCIUM  9.3 03/09/2017 0815   CALCIUM  9.5 03/10/2016 1312   GFRNONAA 76 (L) 04/08/2014 1328   GFRAA 88 (L) 04/08/2014 1328    BNP No results found for: BNP  ProBNP    Component Value Date/Time   PROBNP 94.9 04/08/2014 1330    Imaging: No  results found.   Assessment & Plan:    1. OSA (obstructive sleep apnea) (Primary)  Assessment and Plan Assessment & Plan Obstructive sleep apnea Severe obstructive sleep apnea diagnosed in 2014 with 34 apneic events per hour. Currently using a CPAP machine with a pressure setting of 5 to 20 cm H2O with a residual AHI of 2.7, indicating well-controlled condition. Reports occasional claustrophobia with the full face mask but no issues with the new CPAP machine. Compliance with CPAP usage is 70% of nights for more than 4 hours, meeting compliance standards. - Continue CPAP therapy nightly for at least 4 hours, 70% of the time. - Ensure CPAP supplies are up to date. - Will schedule annual follow-up for CPAP supply renewal.   Almarie LELON Ferrari, NP 04/21/2024     [1] No Known Allergies [2]  Social History Tobacco Use  Smoking Status Never  Smokeless Tobacco Never

## 2024-04-21 NOTE — Patient Instructions (Signed)
°  VISIT SUMMARY: You came in today for a follow-up regarding your CPAP therapy for severe sleep apnea. You have been using a CPAP machine for about 11 years, and recently received a replacement machine. You reported using the machine regularly, with an average usage of 4 hours and 40 minutes per night. You also mentioned experiencing some claustrophobia with the full face mask and a persistent cough, although a recent chest x-ray showed clear lungs.  YOUR PLAN: -OBSTRUCTIVE SLEEP APNEA: Obstructive sleep apnea is a condition where your airway becomes blocked during sleep, causing breathing pauses. Your condition is well-controlled with your CPAP machine, which you should continue to use nightly for at least 4 hours, 70% of the time. Make sure your CPAP supplies are up to date, and we will schedule an annual follow-up for CPAP supply renewal.  INSTRUCTIONS: Please continue using your CPAP machine nightly for at least 4 hours, 70% of the time. Ensure your CPAP supplies are up to date. We will schedule an annual follow-up for CPAP supply renewal.                      Contains text generated by [employer]bridge.                                 Contains text generated by [employer]bridge.

## 2024-05-09 ENCOUNTER — Encounter: Payer: Self-pay | Admitting: Internal Medicine

## 2024-05-09 ENCOUNTER — Ambulatory Visit: Admitting: Internal Medicine

## 2024-05-09 VITALS — BP 120/82 | HR 92 | Temp 97.9°F | Resp 16 | Ht 70.0 in | Wt 235.5 lb

## 2024-05-09 DIAGNOSIS — G4733 Obstructive sleep apnea (adult) (pediatric): Secondary | ICD-10-CM | POA: Diagnosis not present

## 2024-05-09 DIAGNOSIS — E785 Hyperlipidemia, unspecified: Secondary | ICD-10-CM

## 2024-05-09 DIAGNOSIS — I8390 Asymptomatic varicose veins of unspecified lower extremity: Secondary | ICD-10-CM | POA: Insufficient documentation

## 2024-05-09 DIAGNOSIS — O2200 Varicose veins of lower extremity in pregnancy, unspecified trimester: Secondary | ICD-10-CM | POA: Insufficient documentation

## 2024-05-09 DIAGNOSIS — F419 Anxiety disorder, unspecified: Secondary | ICD-10-CM

## 2024-05-09 DIAGNOSIS — Z0000 Encounter for general adult medical examination without abnormal findings: Secondary | ICD-10-CM | POA: Diagnosis not present

## 2024-05-09 DIAGNOSIS — R739 Hyperglycemia, unspecified: Secondary | ICD-10-CM

## 2024-05-09 DIAGNOSIS — Z0001 Encounter for general adult medical examination with abnormal findings: Secondary | ICD-10-CM

## 2024-05-09 DIAGNOSIS — F32A Depression, unspecified: Secondary | ICD-10-CM

## 2024-05-09 DIAGNOSIS — I10 Essential (primary) hypertension: Secondary | ICD-10-CM | POA: Diagnosis not present

## 2024-05-09 DIAGNOSIS — I8391 Asymptomatic varicose veins of right lower extremity: Secondary | ICD-10-CM

## 2024-05-09 DIAGNOSIS — L989 Disorder of the skin and subcutaneous tissue, unspecified: Secondary | ICD-10-CM | POA: Diagnosis not present

## 2024-05-09 DIAGNOSIS — Z1211 Encounter for screening for malignant neoplasm of colon: Secondary | ICD-10-CM

## 2024-05-09 LAB — CBC WITH DIFFERENTIAL/PLATELET
Basophils Absolute: 0.1 K/uL (ref 0.0–0.1)
Basophils Relative: 0.6 % (ref 0.0–3.0)
Eosinophils Absolute: 0.3 K/uL (ref 0.0–0.7)
Eosinophils Relative: 3.5 % (ref 0.0–5.0)
HCT: 48.5 % (ref 39.0–52.0)
Hemoglobin: 16.5 g/dL (ref 13.0–17.0)
Lymphocytes Relative: 33.1 % (ref 12.0–46.0)
Lymphs Abs: 2.8 K/uL (ref 0.7–4.0)
MCHC: 34 g/dL (ref 30.0–36.0)
MCV: 87.6 fl (ref 78.0–100.0)
Monocytes Absolute: 0.8 K/uL (ref 0.1–1.0)
Monocytes Relative: 9 % (ref 3.0–12.0)
Neutro Abs: 4.6 K/uL (ref 1.4–7.7)
Neutrophils Relative %: 53.8 % (ref 43.0–77.0)
Platelets: 180 K/uL (ref 150.0–400.0)
RBC: 5.54 Mil/uL (ref 4.22–5.81)
RDW: 13.4 % (ref 11.5–15.5)
WBC: 8.6 K/uL (ref 4.0–10.5)

## 2024-05-09 LAB — LIPID PANEL
Cholesterol: 160 mg/dL (ref 28–200)
HDL: 40 mg/dL
LDL Cholesterol: 92 mg/dL (ref 10–99)
NonHDL: 119.96
Total CHOL/HDL Ratio: 4
Triglycerides: 139 mg/dL (ref 10.0–149.0)
VLDL: 27.8 mg/dL (ref 0.0–40.0)

## 2024-05-09 LAB — BASIC METABOLIC PANEL WITH GFR
BUN: 16 mg/dL (ref 6–23)
CO2: 28 meq/L (ref 19–32)
Calcium: 9.5 mg/dL (ref 8.4–10.5)
Chloride: 105 meq/L (ref 96–112)
Creatinine, Ser: 1.22 mg/dL (ref 0.40–1.50)
GFR: 61.76 mL/min
Glucose, Bld: 113 mg/dL — ABNORMAL HIGH (ref 70–99)
Potassium: 4.3 meq/L (ref 3.5–5.1)
Sodium: 142 meq/L (ref 135–145)

## 2024-05-09 LAB — ALT: ALT: 22 U/L (ref 3–53)

## 2024-05-09 LAB — AST: AST: 21 U/L (ref 5–37)

## 2024-05-09 NOTE — Assessment & Plan Note (Signed)
 Here for CPX - Td 02-2022 - shingrix x2 - Had a flu shot - Rec covid booster  -CCS: Colonoscopy  08/2008, tubular adenoma, colonoscopy 2015, colonoscopy 04/23/2021, due for a follow-up colonoscopy.  Referral sent -Prostate cancer screening:  H/o elevated PSA , s/p  prostate bx 02/21/2020: Benign.  PSA 1.4 per [facility] (08/06/2023) -(+)  FH of CAD :asx -Patient education: Diet, exercise -Labs: BMP AST ALT FLP CBC

## 2024-05-09 NOTE — Assessment & Plan Note (Signed)
 Here for CPX Other issues addressed today OSA Managed with CPAP.  Good tolerance and compliance unless he has URI. Varicose veins R leg: Varicose veins on the right leg are not severe but could worsen thus rec the use of compression stockings, especially on days with prolonged sitting or standing. Seborrheic keratosis Multiple skin lesions on face and scalp, likely SKs, referred to dermatology. Hyperlipidemia Managed with atorvastatin  40 mg at night.  No change Anxiety depression: Managed with sertraline  and bupropion . Reports difficulty swallowing bupropion  due to pill size. - Discuss with pharmacist about smaller bupropion  tablet or Wellbutrin  XL. - Consider splitting bupropion  tablet if smaller tablet is unavailable. Right hip pain, chronic Chronic right hip pain, managed with ibuprofen and acetaminophen  as needed. - Continue current pain management with ibuprofen and acetaminophen  as needed. - Advised follow-up if pain worsens. RTC 6 months

## 2024-05-09 NOTE — Progress Notes (Signed)
 "  Subjective:    Patient ID: Oscar Fuller, male    DOB: Oct 04, 1955, 68 y.o.   MRN: [id number]  DOS:  05/09/2024 CPX  Discussed the use of AI scribe software for clinical note transcription with the patient, who gave verbal consent to proceed.  History of Present Illness Oscar Fuller is a 68 year old male who presents for an annual physical exam.  Varicose veins - Mild varicose veins present on right leg - Left leg unaffected  Cutaneous lesions - Multiple skin lesions on face and scalp - No recent dermatology evaluation  Chronic right hip pain - Chronic right hip pain - Uses ibuprofen and acetaminophen  as needed for pain control - No recent evaluation for hip pain  Obstructive sleep apnea - Uses CPAP machine regularly   Psychiatric medication management - Takes sertraline  and bupropion  for anxiety - Difficulty swallowing bupropion  due to pill size  Cardiovascular risk management - Takes aspirin  and atorvastatin  40 mg at night - Discontinued metoprolol  - Blood pressure remains stable off metoprolol   Review of Systems  Other than above, a 14 point review of systems is negative    Past Medical History:  Diagnosis Date   Annual physical exam 10/31/2010   Anxiety    Anxiety and depression 10/16/2009   Qualifier: Diagnosis of   By: Amon MD, Aloysius BRAVO.        Atypical chest pain    with ER visit in 8/11. ETT-myoview showed EF 61% no ischemia or infaraction 8/11   Bilateral pulmonary embolism (HCC) 12/09/2013   BPH (benign prostatic hyperplasia) 08/10/2006   Annotation: BX NORMAL.  Qualifier: Diagnosis of   By: Antonio ROSALEA Rockers         CAD (coronary artery disease), moderate nonobstructive atherosclerosis, Ca score 49.9 and plaque volume 210mm3, CAD RADS 1 study involving LAD- Feb/2025 07/07/2023   CT coronary angiog 07/07/2023: moderate nonobstructive atherosclerosis, Ca score 49.9 and plaque volume 269mm3, CAD RADS 1 study involving LAD ;   Extracardiac read from  Radiologist pending     Chronic anticoagulation 03/28/2014   Clotting disorder    DVT 2015   Depression    Dizziness 06/18/2023   DVT (deep venous thrombosis) (HCC)    Dyslipidemia 08/10/2006   Qualifier: Diagnosis of   By: Antonio ROSALEA Rockers         Dyspnea on exertion 06/18/2023   ED (erectile dysfunction) 07/2008   used some cialis, symptoms not active at present    ELEVATED BLOOD PRESSURE WITHOUT DIAGNOSIS OF HYPERTENSION 07/28/2008   amb BPS ~ 130-140/80-90        History of colon polyps 03/28/2014   Hyperglycemia 09/07/2013   Hyperlipidemia    Hypertension    was on ACEi-diuretics   Increased prostate specific antigen (PSA) velocity    saw Dr Kathlynn, s/p Bx(-) 2002,2022   Insomnia 02/14/2014   OBESITY 07/28/2008   Qualifier: Diagnosis of   By: Amon MD, Maribeth Jiles E.        OSA (obstructive sleep apnea) 06/08/2012   HST 05/2012:  AHI 34/hr with desat as low as 69%.-- on CPAP  Auto setting 5-20cm     PCP NOTE >>>>>>>>>>>> 02/26/2015   Prediabetes 09/07/2013   Pulmonary embolism (HCC) 11/2013   PE and L DVT after a trip   SCC (squamous cell carcinoma)    Dr Dwane, in the back   Tubular adenoma of colon 08/2008    Past Surgical History:  Procedure Laterality Date  COLONOSCOPY     KNEE SURGERY  2009   left arthroscopy   POLYPECTOMY     PROSTATE BIOPSY  02/2020   benign, biopsy x 2 2002 and 2021 or 2022 both benign   VASECTOMY      Current Outpatient Medications  Medication Instructions   Aspirin  162 mg, Daily   atorvastatin  (LIPITOR) 40 mg, Oral, Daily at bedtime   buPROPion  (WELLBUTRIN ) 100 mg, Oral, 2 times daily   Multiple Vitamin (MULTIVITAMIN) tablet 1 tablet, Daily   sertraline  (ZOLOFT ) 100 mg, Oral, Daily   tamsulosin  (FLOMAX ) 0.4 mg, Daily       Objective:   Physical Exam BP 120/82   Pulse 92   Temp 97.9 F (36.6 C) (Oral)   Resp 16   Ht 5' 10 (1.778 m)   Wt 235 lb 8 oz (106.8 kg)   SpO2 92%   BMI 33.79 kg/m  General: Well developed, NAD, BMI  noted Neck: No  thyromegaly  HEENT:  Normocephalic . Face symmetric, atraumatic Lungs:  CTA B Normal respiratory effort, no intercostal retractions, no accessory muscle use. Heart: RRR,  no murmur.  Abdomen:  Not distended, soft, non-tender. No rebound or rigidity.   Lower extremities: Calves soft, nontender. Small superficial varicose veins at the right leg, lateral from the knee and calf.  No phlebitis noted. Skin: Multiple hyperpigmented, macular, slightly rough skin lesions at the face and scalp Neurologic:  alert & oriented X3.  Speech normal, gait appropriate for age and unassisted Strength symmetric and appropriate for age.  Psych: Cognition and judgment appear intact.  Cooperative with normal attention span and concentration.  Behavior appropriate. No anxious or depressed appearing.     Assessment    Problem list Prediabetes HTN Hyperlipidemia Depression, anxiety OSA 2014, Dr Shellia Hematology Dr.Ennever, heterozygous for factor V Leiden --H/o  PE, DVT 11-2013 after a trip  --Thrombophlebitis 02-2015 -- off xarelto , on ASA 162 since 08-2016 FH CAD Nonobstructive CAD per cardiac CT 07/07/2023.  Coronary calcium  score 49, minimal plaque at the LAD.  EF normal, he was found to have short runs of SVT GU: BPH, LUTS, increased PSA velocity BX 2002.  BX October 2021 benign  Assessment & Plan Here for CPX - Td 02-2022 - shingrix x2 - Had a flu shot - Rec covid booster  -CCS: Colonoscopy  08/2008, tubular adenoma, colonoscopy 2015, colonoscopy 04/23/2021, due for a follow-up colonoscopy.  Referral sent -Prostate cancer screening:  H/o elevated PSA , s/p  prostate bx 02/21/2020: Benign.  PSA 1.4 per [facility] (08/06/2023) -(+)  FH of CAD :asx -Patient education: Diet, exercise -Labs: BMP AST ALT FLP CBC  Other issues addressed today OSA Managed with CPAP.  Good tolerance and compliance unless he has URI. Varicose veins R leg: Varicose veins on the right leg are not severe but  could worsen thus rec the use of compression stockings, especially on days with prolonged sitting or standing. Seborrheic keratosis Multiple skin lesions on face and scalp, likely SKs, referred to dermatology. Hyperlipidemia Managed with atorvastatin  40 mg at night.  No change Anxiety depression: Managed with sertraline  and bupropion . Reports difficulty swallowing bupropion  due to pill size. - Discuss with pharmacist about smaller bupropion  tablet or Wellbutrin  XL. - Consider splitting bupropion  tablet if smaller tablet is unavailable. Right hip pain, chronic Chronic right hip pain, managed with ibuprofen and acetaminophen  as needed. - Continue current pain management with ibuprofen and acetaminophen  as needed. - Advised follow-up if pain worsens. RTC 6 months    "

## 2024-05-09 NOTE — Patient Instructions (Signed)
 GO TO THE LAB :  Get the blood work    Then, go to the front desk for the checkout Please make an appointment for a checkup in 6 months     Check the  blood pressure regularly Blood pressure goal:  between 110/65 and  135/85. If it is consistently higher or lower, let me know    OBSTRUCTIVE SLEEP APNEA: You have obstructive sleep apnea and use a CPAP machine regularly   VARICOSE VEINS OF RIGHT LOWER EXTREMITY: You have mild varicose veins on your right leg  -Use compression stockings, especially on days when you will be sitting or standing for long periods.  SEBORRHEIC KERATOSIS: You have multiple skin lesions on your face and scalp, likely seborrheic keratosis, which is a benign condition. -You are referred to a dermatologist for evaluation.  HYPERLIPIDEMIA: Your high cholesterol is being managed with atorvastatin . -Continue taking atorvastatin  40 mg at night.  ANXIETY AND DEPRESSION: You are taking sertraline  and bupropion  for anxiety and depression, but you have difficulty swallowing the bupropion  due to its size. -Discuss with your pharmacist about getting a smaller bupropion  tablet or Wellbutrin  XL.    GENERAL HEALTH MAINTENANCE:   -You are referred to a gastroenterologist for a colonoscopy. -Get a COVID vaccine at the pharmacy.

## 2024-05-10 ENCOUNTER — Ambulatory Visit: Payer: Self-pay | Admitting: Internal Medicine

## 2024-06-15 ENCOUNTER — Encounter: Payer: Self-pay | Admitting: Internal Medicine

## 2024-11-29 ENCOUNTER — Ambulatory Visit: Admitting: Internal Medicine

## 2024-12-14 ENCOUNTER — Ambulatory Visit
